# Patient Record
Sex: Female | Born: 1960
Health system: Southern US, Community
[De-identification: ages and names within clinical notes are randomized; demographics above are authoritative.]

## PROBLEM LIST (undated history)

## (undated) DIAGNOSIS — K219 Gastro-esophageal reflux disease without esophagitis: Secondary | ICD-10-CM

## (undated) DIAGNOSIS — Z8669 Personal history of other diseases of the nervous system and sense organs: Secondary | ICD-10-CM

## (undated) DIAGNOSIS — E785 Hyperlipidemia, unspecified: Secondary | ICD-10-CM

## (undated) DIAGNOSIS — I10 Essential (primary) hypertension: Secondary | ICD-10-CM

## (undated) DIAGNOSIS — E119 Type 2 diabetes mellitus without complications: Secondary | ICD-10-CM

## (undated) DIAGNOSIS — F329 Major depressive disorder, single episode, unspecified: Secondary | ICD-10-CM

## (undated) DIAGNOSIS — R519 Headache, unspecified: Secondary | ICD-10-CM

## (undated) DIAGNOSIS — F32A Depression, unspecified: Secondary | ICD-10-CM

## (undated) DIAGNOSIS — F419 Anxiety disorder, unspecified: Secondary | ICD-10-CM

## (undated) HISTORY — DX: Essential (primary) hypertension: I10

## (undated) HISTORY — DX: Hyperlipidemia, unspecified: E78.5

## (undated) HISTORY — PX: COLONOSCOPY W/ POLYPECTOMY: SHX1380

## (undated) HISTORY — DX: Personal history of other diseases of the nervous system and sense organs: Z86.69

---

## 1997-10-12 ENCOUNTER — Other Ambulatory Visit: Admission: RE | Admit: 1997-10-12 | Discharge: 1997-10-12 | Payer: Self-pay | Admitting: Obstetrics and Gynecology

## 1997-10-17 ENCOUNTER — Inpatient Hospital Stay (HOSPITAL_COMMUNITY): Admission: AD | Admit: 1997-10-17 | Discharge: 1997-10-17 | Payer: Self-pay | Admitting: Obstetrics & Gynecology

## 1997-11-07 ENCOUNTER — Inpatient Hospital Stay (HOSPITAL_COMMUNITY): Admission: AD | Admit: 1997-11-07 | Discharge: 1997-11-09 | Payer: Self-pay | Admitting: Obstetrics & Gynecology

## 1997-12-05 ENCOUNTER — Other Ambulatory Visit: Admission: RE | Admit: 1997-12-05 | Discharge: 1997-12-05 | Payer: Self-pay | Admitting: Obstetrics & Gynecology

## 1998-07-29 ENCOUNTER — Emergency Department (HOSPITAL_COMMUNITY): Admission: EM | Admit: 1998-07-29 | Discharge: 1998-07-29 | Payer: Self-pay | Admitting: Emergency Medicine

## 1999-02-12 ENCOUNTER — Other Ambulatory Visit: Admission: RE | Admit: 1999-02-12 | Discharge: 1999-02-12 | Payer: Self-pay | Admitting: Gynecology

## 2000-02-13 ENCOUNTER — Other Ambulatory Visit: Admission: RE | Admit: 2000-02-13 | Discharge: 2000-02-13 | Payer: Self-pay | Admitting: Obstetrics & Gynecology

## 2001-06-08 ENCOUNTER — Other Ambulatory Visit: Admission: RE | Admit: 2001-06-08 | Discharge: 2001-06-08 | Payer: Self-pay | Admitting: Obstetrics & Gynecology

## 2002-07-19 ENCOUNTER — Encounter: Payer: Self-pay | Admitting: Obstetrics

## 2002-07-19 ENCOUNTER — Ambulatory Visit (HOSPITAL_COMMUNITY): Admission: RE | Admit: 2002-07-19 | Discharge: 2002-07-19 | Payer: Self-pay | Admitting: Obstetrics

## 2002-12-16 ENCOUNTER — Emergency Department (HOSPITAL_COMMUNITY): Admission: EM | Admit: 2002-12-16 | Discharge: 2002-12-16 | Payer: Self-pay | Admitting: Emergency Medicine

## 2005-09-16 ENCOUNTER — Ambulatory Visit (HOSPITAL_COMMUNITY): Admission: RE | Admit: 2005-09-16 | Discharge: 2005-09-16 | Payer: Self-pay | Admitting: Obstetrics

## 2007-10-07 ENCOUNTER — Emergency Department (HOSPITAL_COMMUNITY): Admission: EM | Admit: 2007-10-07 | Discharge: 2007-10-07 | Payer: Self-pay | Admitting: Emergency Medicine

## 2009-04-18 IMAGING — CT CT HEAD W/O CM
1 series · 16 of 30 positions shown, 20 images · non-contrast
Comparison: None.

CLINICAL DATA: 46-year-old female with headache nausea and
tingling.

CT HEAD WITHOUT CONTRAST
TECHNIQUE: Contiguous axial images were obtained from the base of
the skull through the vertex without contrast.

[Series 2: head routine 4.8 h37s · axial · 0.39mm/px · z∈[+1392,+1547]mm · 16 of 36 slices shown, 20 images]
[im 2/36  brain]
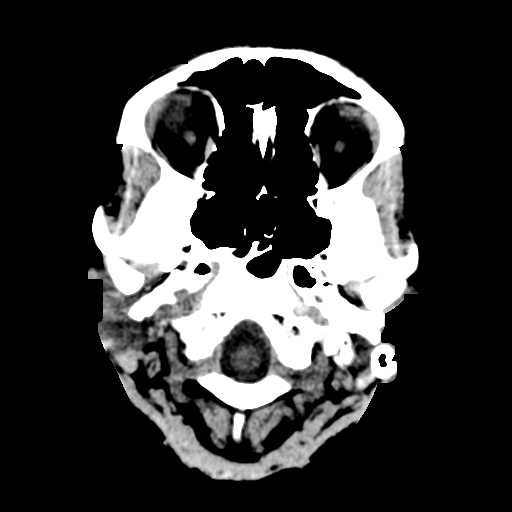
[im 2/36  bone]
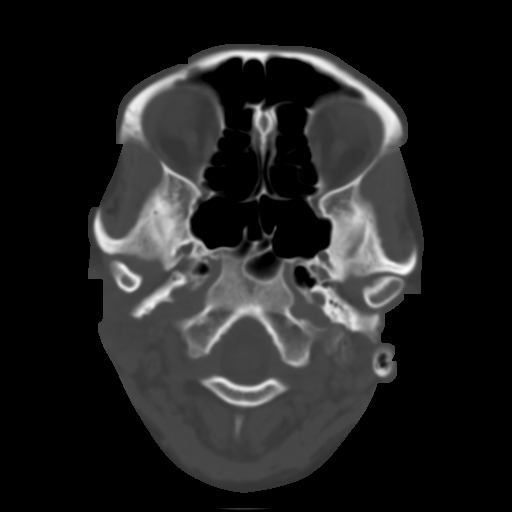
[im 4/36  brain]
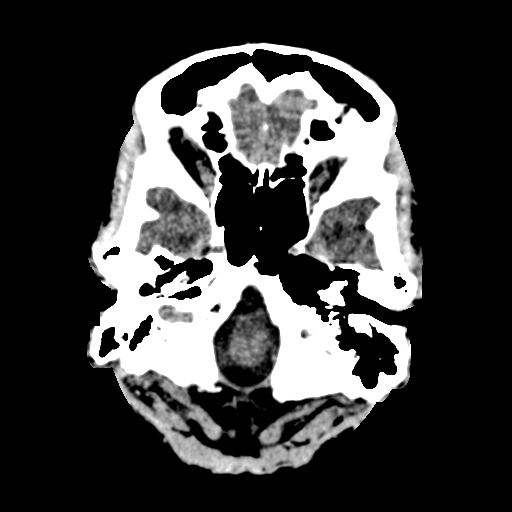
[im 7/36  brain]
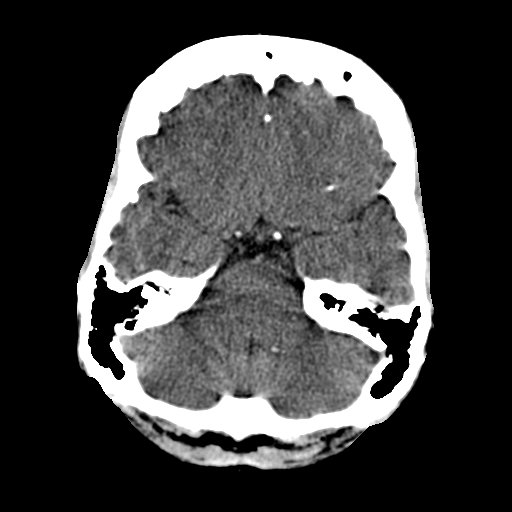
[im 9/36  brain]
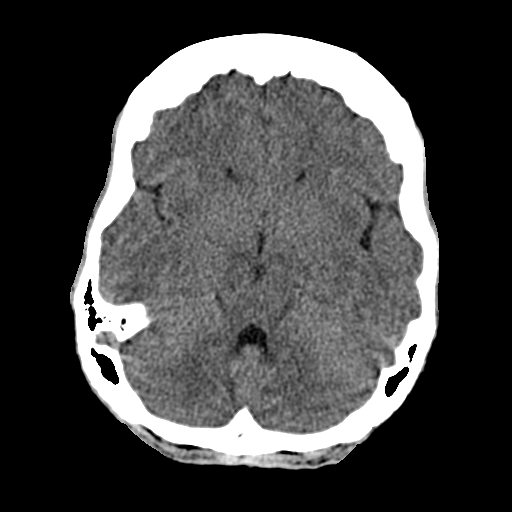
[im 10/36  brain]
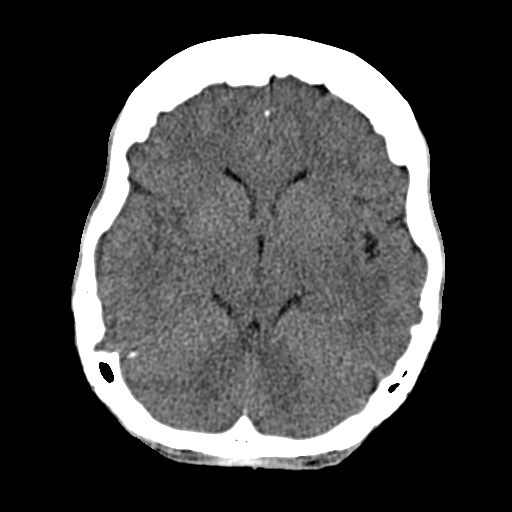
[im 10/36  bone]
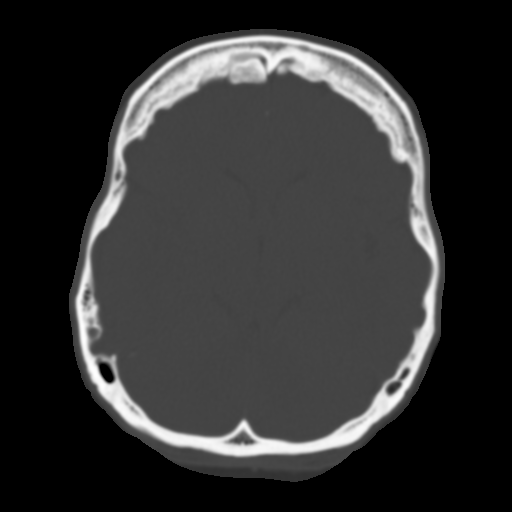
[im 13/36  brain]
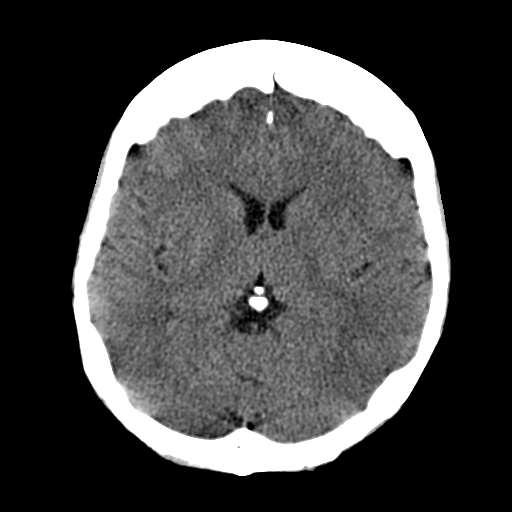
[im 15/36  brain]
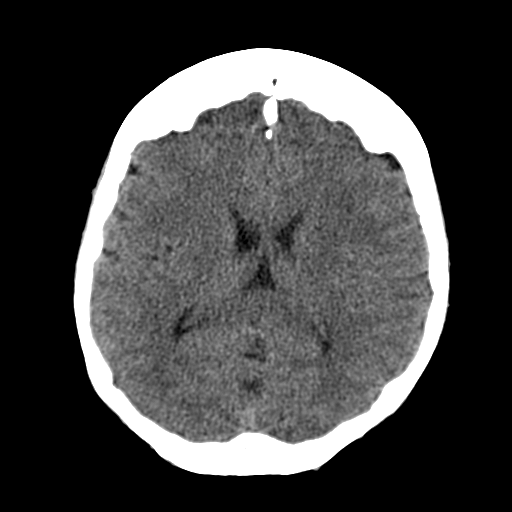
[im 17/36  brain]
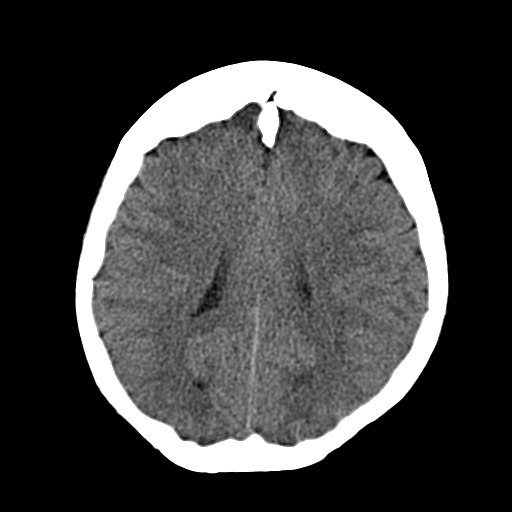
[im 19/36  brain]
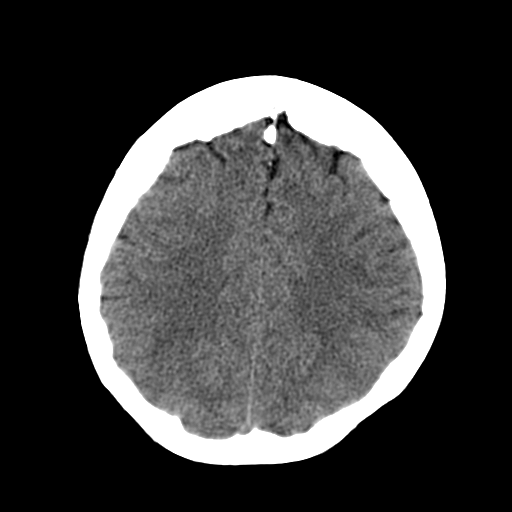
[im 19/36  bone]
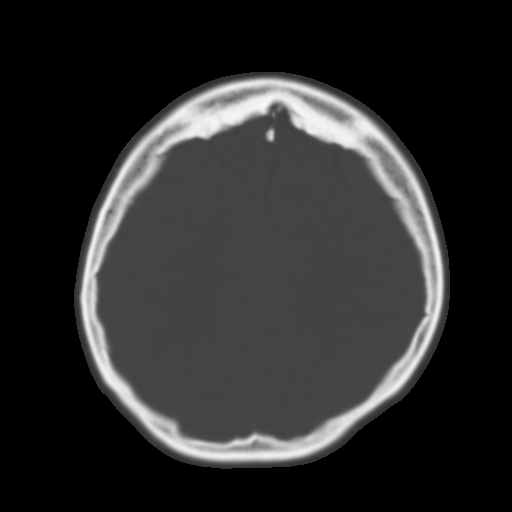
[im 21/36  brain]
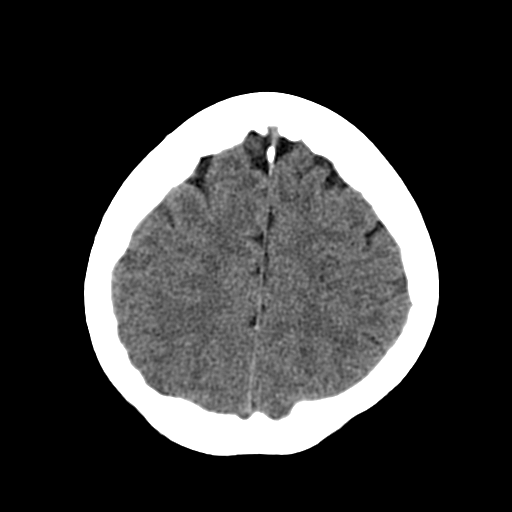
[im 23/36  brain]
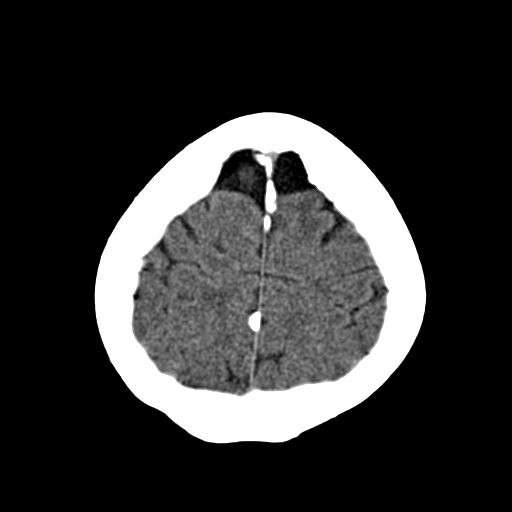
[im 26/36  brain]
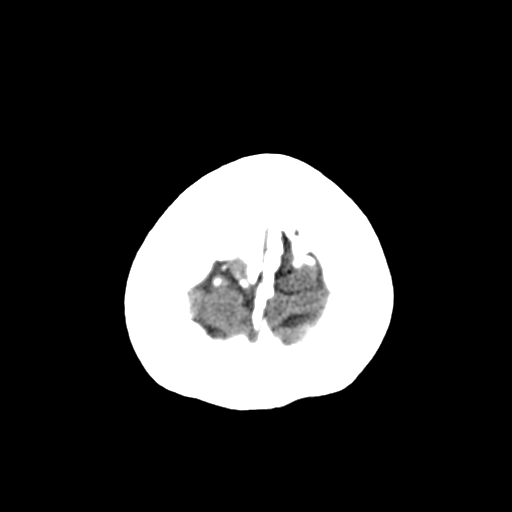
[im 27/36  brain]
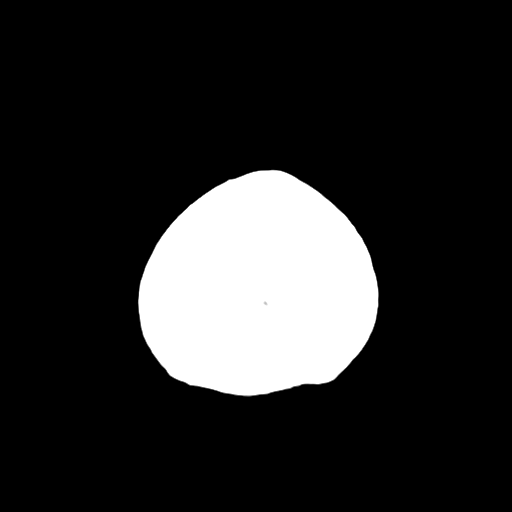
[im 27/36  bone]
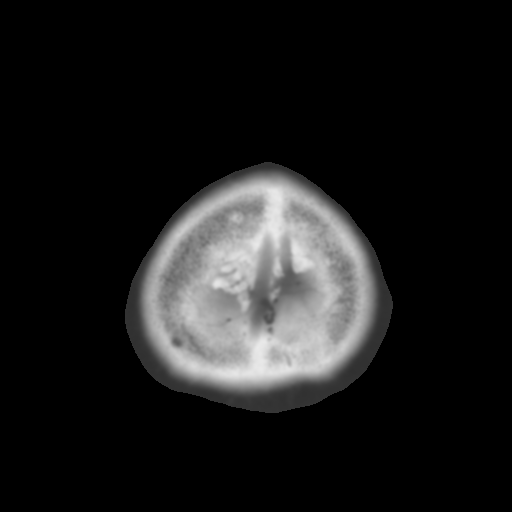
[im 29/36  brain]
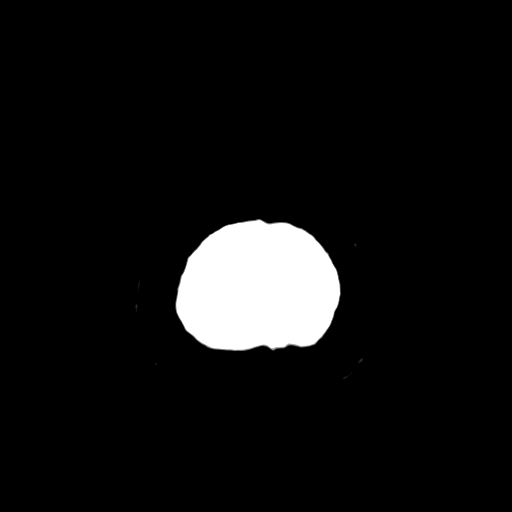
[im 32/36  brain]
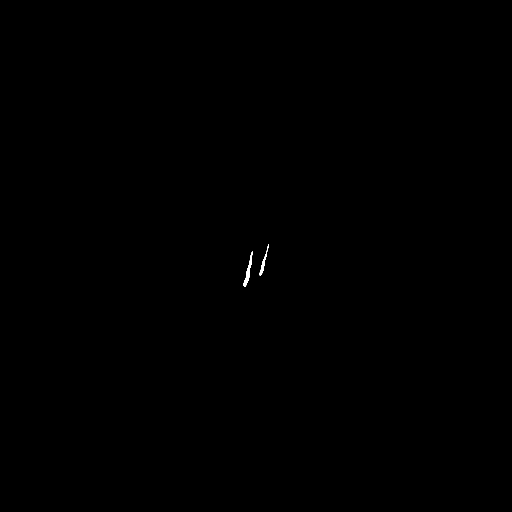
[im 34/36  brain]
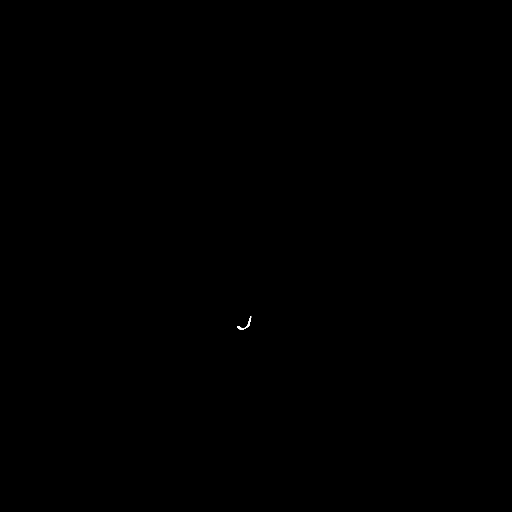

[16 of 30 positions shown; findings below may reference images not displayed]

FINDINGS: Visualized scalp soft tissues and orbits are normal.
Visualized paranasal sinuses and mastoids are clear.  Incidental
hyperostosis frontalis.  No acute osseous abnormality.

Cerebral volume within normal limits for age.  No midline shift,
ventriculomegaly, mass effect, intracranial hemorrhage or loss of
gray-white matter differentiation.  No evidence of acute cortically
based infarct.
IMPRESSION: Normal noncontrast appearance of the brain.  No acute findings.

## 2009-10-26 ENCOUNTER — Encounter: Admission: RE | Admit: 2009-10-26 | Discharge: 2009-10-26 | Payer: Self-pay | Admitting: Neurology

## 2009-11-22 ENCOUNTER — Ambulatory Visit: Payer: Self-pay | Admitting: Vascular Surgery

## 2010-03-14 ENCOUNTER — Ambulatory Visit: Payer: Self-pay | Admitting: Vascular Surgery

## 2010-05-16 ENCOUNTER — Ambulatory Visit: Payer: Self-pay | Admitting: Vascular Surgery

## 2010-05-23 ENCOUNTER — Ambulatory Visit: Payer: Self-pay | Admitting: Vascular Surgery

## 2010-11-13 NOTE — Procedures (Signed)
DUPLEX DEEP VENOUS EXAM - LOWER EXTREMITY   INDICATION:  Follow up left greater saphenous vein ablation.   HISTORY:  Edema:  No.  Trauma/Surgery:  Yes, left greater saphenous vein ablation, 05/16/2010.  Pain:  Yes.  PE:  No.  Previous DVT:  No.  Anticoagulants:  No.  Other:   DUPLEX EXAM:                CFV   SFV   PopV  PTV    GSV                R  L  R  L  R  L  R   L  R  L  Thrombosis    o  o     o     o      o     +  Spontaneous   +  +     +     +      +     o  Phasic        +  +     +     +      +     o  Augmentation  +  +     +     +      +     o  Compressible  +  +     +     +      +     o  Competent     +  +     +     +      +     o   Legend:  + - yes  o - no  p - partial  D - decreased   IMPRESSION:  1. There is no evidence of acute deep vein thrombosis within the left      lower extremity.  2. Successful  left greater saphenous vein ablation from the      saphenofemoral junction to distal insertion site.    _____________________________  Larina Earthly, M.D.   OD/MEDQ  D:  05/23/2010  T:  05/23/2010  Job:  [id number]

## 2010-11-13 NOTE — Assessment & Plan Note (Signed)
OFFICE VISIT   Danielle Calderon, Danielle Calderon  DOB:  [birth year]                                       03/14/2010  ZOXWR#:[id number]   Patient presents today for continued discussion regarding her severe  venous hypertension in her left leg.  She reports that she has had no  improvement with thigh-high graduated compression.  She continues to  elevate her legs when possible and does take ibuprofen for discomfort.  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  She also reports that housework and house-cleaning  chores are difficult due to leg pain as well.  Her medical history is  otherwise unchanged.  She has excellent health except for mild elevated  blood pressure and elevated cholesterol.  She does have a history of  headaches.   SOCIAL HISTORY:  Single with 1 child.  She does not smoke, having quit  in 1994.  She does not drink alcohol.  She works as an Charity fundraiser.   Review of systems is unchanged, as documented in her chart.   PHYSICAL EXAMINATION:  A well-developed and well-nourished black female  appearing stated age in no acute distress.  Blood pressure is 128/80,  pulse 80, respirations 18.  Her temperature is 98 degrees.  HEENT is  normal.  Cardiovascular:  She has 2+ radial and 2+ dorsalis pedis pulses  bilaterally.  Musculoskeletal shows no major deformities or cyanosis.  Neurologic:  No focal weakness or paresthesias.  Skin without ulcers or  rashes.  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   I have reviewed her duplex with her and reimaged her vein.  She does  have reflux in her saphenous vein.  This is a normal course throughout  the proximal thigh and then becomes more superficial after branching all  the way down to the takeoff into the varicosities.   I have recommended that we proceed with laser ablation for  relief of her  symptoms and stab phlebectomy for removal of the tributaries  varicosities.  She understands this is an outpatient procedure under  local anesthesia.  We will schedule this at her earliest convenience.     Larina Earthly, M.D.  Electronically Signed   TFE/MEDQ  D:  03/14/2010  T:  03/14/2010  Job:  [id number]   cc:   Candyce Churn. Allyne Gee, M.D.

## 2010-11-13 NOTE — Consult Note (Signed)
NEW PATIENT CONSULTATION   Danielle Calderon, Danielle Calderon  DOB:  [birth year]                                       11/22/2009  [id number]#:[id number]   The patient presents today for evaluation of venous hypertension,  varicose veins in her left leg.  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  She describes this as an achy sensation that occurs at the end of  the day and also present at night after standing on her legs for a long  period of time.  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  She does not have any history of DVT or  bleeding.  She reports that this area in her pretibial area and calf can  be relieved somewhat with elevation.   Her past medical history is significant for hypertension, elevated  cholesterol.   Her family history is negative for premature atherosclerotic disease and  is negative for venous pathology.   SOCIAL HISTORY:  She is single.  She has one child.  She does not smoke,  having quit in 1994.  She does not drink alcohol.   REVIEW OF SYSTEMS:  Positive for weight loss down to 161 pounds.  She is  5 feet 5 inches tall.  Cardiac, pulmonary, GI and GU are negative.  VASCULAR:  Positive only for the above.  NEUROLOGIC:  Positive for headaches.  MUSCULOSKELETAL:  Negative.  PSYCHIATRIC:  Positive for depression and anxiety.  HEENT:  Positive only for change of eyesight.  Hematologic and skin is negative.   PHYSICAL EXAMINATION:  General:  A well-developed, well-nourished female  appearing stated age in no acute distress.  Vital signs:  Blood pressure  is 120/60, her heart rate is 72, respirations 18.  HEENT:  Normal aside  from poor dentition.  Chest:  Her chest is clear.  Abdomen:  Soft,  nontender.  Musculoskeletal:  Shows no major deformities or cyanosis.  Neurological:  No focal weakness or paresthesias.  Skin:  Without rashes  or  ulcers or changes of venous hypertension.  She does have palpable  varicosities over her left pretibial area.   On duplex ultrasound these do arise from her great saphenous vein.  She  underwent formal duplex and this shows no evidence of reflux in her  small saphenous vein on the left.  She does have mild reflux in her deep  system.  Does have gross reflux throughout her saphenous vein on her  left leg from her saphenofemoral junction distally.  The vein is not  aneurysmal or tortuous.  I discussed the significance of this with the  patient.  I explained that this is related to her hypertension extending  into these varicosities.  She has not worn compression garments and I  explained the importance of these.  She is fitted today with 20-30 mmHg  thigh high compression garments and instructed on their daily use while  she is standing.  She will continue with ibuprofen for discomfort and  elevate her legs when possible.  We will see her again in 3 months to  determine if conservative treatment is effective.  I also discussed the  possibility of laser ablation of her saphenous vein and stab phlebectomy  with multiple tributary varicosities should  she fail conservative  treatment.  She understands and will see [place] again in 3 months.     Larina Earthly, M.D.  Electronically Signed   TFE/MEDQ  D:  11/22/2009  T:  11/23/2009  Job:  [id number]   cc:   Candyce Churn. Allyne Gee, M.D.

## 2010-11-13 NOTE — Assessment & Plan Note (Signed)
OFFICE VISIT   Danielle Calderon, Danielle Calderon  DOB:  September 15, 1960                                       05/23/2010  ZOXWR#:[id number]   Patient presents today for follow-up of her left great saphenous vein  laser ablation and stab phlebectomy.  She had the usual amount of  postoperative soreness and bruising and is doing quite well.   She underwent a repeat duplex in our office.  This reveals no evidence  of DVT and complete closure from her distal insertion site to just below  the saphenofemoral junction.  We are quite pleased with her results.  Plan to see her again on an as-needed basis.     Larina Earthly, M.D.  Electronically Signed   TFE/MEDQ  D:  05/23/2010  T:  05/23/2010  Job:  [id number]

## 2010-11-13 NOTE — Procedures (Signed)
LOWER EXTREMITY VENOUS REFLUX EXAM   INDICATION:  Painful varicose veins.   EXAM:  Using color-flow imaging and pulse Doppler spectral analysis, the  left common femoral, superficial femoral, popliteal, posterior tibial,  greater and lesser saphenous veins are evaluated.  There is evidence  suggesting deep venous insufficiency in the left lower extremity.   The left saphenofemoral junction is not competent with reflux of  >517milliseconds. The left GSV is not competent with reflux of  >550milliseconds with the caliber as described below.   The left proximal short saphenous vein demonstrates competency.   GSV Diameter (used if found to be incompetent only)                                            Right    Left  Proximal Greater Saphenous Vein           cm       0.85 cm  Proximal-to-mid-thigh                     cm       0.66 cm  Mid thigh                                 cm       0.58 cm  Mid-distal thigh                          cm       0.59 cm  Distal thigh                              cm       0.59 cm  Knee                                      cm       0.64 cm    IMPRESSION:  1. Left greater saphenous vein reflux with >572milliseconds is      identified with the caliber ranging from 0.58 cm to 0.85 cm knee to      groin.  2. The left greater saphenous vein is not aneurysmal.  3. The left greater saphenous vein is not tortuous.  4. The deep venous system is not competent with reflux of      >569milliseconds.  5. The left lesser saphenous vein is competent.         ___________________________________________  Larina Earthly, M.D.   CB/MEDQ  D:  11/22/2009  T:  11/22/2009  Job:  [id number]

## 2010-11-13 NOTE — Assessment & Plan Note (Signed)
OFFICE VISIT   ZEINA, AKKERMAN  DOB:  Dec 30, 1960                                       05/16/2010  UEAVW#:[id number]   The patient underwent uneventful laser ablation of her left great  saphenous vein from below-knee position up to just below her  saphenofemoral junction and stab phlebectomy of 10 to 20 tributary  varicosities in her medial calf.  She had no immediate complications and  will be seen again in 1 week for followup.     Larina Earthly, M.D.  Electronically Signed   TFE/MEDQ  D:  05/16/2010  T:  05/17/2010  Job:  [id number]

## 2011-03-26 LAB — POCT I-STAT, CHEM 8
Creatinine, Ser: 1
HCT: 37
Hemoglobin: 12.6
Potassium: 3.2 — ABNORMAL LOW
Sodium: 140
TCO2: 29

## 2011-03-26 LAB — URINALYSIS, ROUTINE W REFLEX MICROSCOPIC
Bilirubin Urine: NEGATIVE
Ketones, ur: 15 — AB
Nitrite: NEGATIVE
Specific Gravity, Urine: 1.012
Urobilinogen, UA: 1
pH: 7

## 2011-03-26 LAB — POCT CARDIAC MARKERS
CKMB, poc: <1 — ABNORMAL LOW
Myoglobin, poc: 80.2
Operator id: 257131
Troponin i, poc: <0.05

## 2011-03-26 LAB — DIFFERENTIAL
Lymphocytes Relative: 45
Lymphs Abs: 2.5
Monocytes Relative: 8
Neutro Abs: 2.5
Neutrophils Relative %: 45

## 2011-03-26 LAB — CBC
MCV: 79.9
Platelets: 228
RBC: 4.34
WBC: 5.6

## 2011-03-26 LAB — URINE MICROSCOPIC-ADD ON

## 2011-03-26 LAB — POCT PREGNANCY, URINE
Operator id: 257131
Preg Test, Ur: NEGATIVE

## 2011-03-26 LAB — URINE CULTURE: Colony Count: 10000

## 2012-10-26 ENCOUNTER — Encounter: Payer: Self-pay | Admitting: Obstetrics

## 2012-10-26 ENCOUNTER — Ambulatory Visit (INDEPENDENT_AMBULATORY_CARE_PROVIDER_SITE_OTHER): Payer: BC Managed Care – PPO | Admitting: Obstetrics

## 2012-10-26 VITALS — BP 123/80 | HR 62 | Temp 97.9°F | Ht 65.0 in | Wt 184.0 lb

## 2012-10-26 DIAGNOSIS — Z124 Encounter for screening for malignant neoplasm of cervix: Secondary | ICD-10-CM

## 2012-10-26 DIAGNOSIS — Z01419 Encounter for gynecological examination (general) (routine) without abnormal findings: Secondary | ICD-10-CM

## 2012-10-26 DIAGNOSIS — Z113 Encounter for screening for infections with a predominantly sexual mode of transmission: Secondary | ICD-10-CM

## 2012-10-26 DIAGNOSIS — N760 Acute vaginitis: Secondary | ICD-10-CM

## 2012-10-26 NOTE — Progress Notes (Signed)
Subjective:     Danielle Calderon is a 52 y.o. female here for a routine exam.  Current complaints: annual exam. Pt would like to follow up on her fibroids. Pt states she is not having any issues from them.  Personal health questionnaire reviewed: yes.   Gynecologic History No LMP recorded. Contraception: abstinence Last Pap: 2011 Results were: normal Last mammogram: 2014. Results were: normal  Obstetric History OB History   Grav Para Term Preterm Abortions TAB SAB Ect Mult Living                   The following portions of the patient's history were reviewed and updated as appropriate: allergies, current medications, past family history, past medical history, past social history, past surgical history and problem list.  Review of Systems Pertinent items are noted in HPI.     Objective:    General appearance: alert and no distress Breasts: normal appearance, no masses or tenderness Abdomen: normal findings: soft, non-tender Pelvic: cervix normal in appearance, external genitalia normal, no adnexal masses or tenderness, no cervical motion tenderness, uterus normal size, shape, and consistency and vagina normal without discharge    Assessment:    Healthy female exam.    Plan:    Education reviewed: calcium supplements and self breast exams. F/U 1 year.

## 2012-10-27 ENCOUNTER — Encounter: Payer: Self-pay | Admitting: Obstetrics

## 2012-10-27 LAB — PAP IG W/ RFLX HPV ASCU

## 2012-10-27 LAB — WET PREP BY MOLECULAR PROBE
Candida species: NEGATIVE
Gardnerella vaginalis: POSITIVE — AB
Trichomonas vaginosis: NEGATIVE

## 2012-10-27 NOTE — Patient Instructions (Signed)
Wellness.  Menopause Healthy eating and living.

## 2012-10-30 ENCOUNTER — Other Ambulatory Visit: Payer: Self-pay | Admitting: *Deleted

## 2012-10-30 DIAGNOSIS — B9689 Other specified bacterial agents as the cause of diseases classified elsewhere: Secondary | ICD-10-CM

## 2012-10-30 MED ORDER — METRONIDAZOLE 500 MG PO TABS: 500.0000 mg | ORAL_TABLET | Freq: Two times a day (BID) | ORAL | Status: AC

## 2012-10-30 NOTE — Progress Notes (Signed)
Affirm test positive for BV per Dr. Verdell Carmine recs pt aware and medication escribed to pts pharmacy.

## 2013-03-02 ENCOUNTER — Other Ambulatory Visit: Payer: Self-pay | Admitting: Dermatology

## 2013-12-15 ENCOUNTER — Ambulatory Visit: Payer: BC Managed Care – PPO | Admitting: Obstetrics

## 2014-03-29 ENCOUNTER — Ambulatory Visit: Payer: No Typology Code available for payment source | Admitting: Obstetrics

## 2014-04-06 ENCOUNTER — Ambulatory Visit (INDEPENDENT_AMBULATORY_CARE_PROVIDER_SITE_OTHER): Payer: No Typology Code available for payment source | Admitting: Obstetrics

## 2014-04-06 ENCOUNTER — Encounter: Payer: Self-pay | Admitting: Obstetrics

## 2014-04-06 VITALS — BP 108/69 | HR 64 | Temp 97.1°F | Ht 65.0 in | Wt 203.0 lb

## 2014-04-06 DIAGNOSIS — Z0000 Encounter for general adult medical examination without abnormal findings: Secondary | ICD-10-CM

## 2014-04-06 DIAGNOSIS — Z124 Encounter for screening for malignant neoplasm of cervix: Secondary | ICD-10-CM

## 2014-04-06 DIAGNOSIS — Z01419 Encounter for gynecological examination (general) (routine) without abnormal findings: Secondary | ICD-10-CM

## 2014-04-06 NOTE — Progress Notes (Signed)
Subjective:     Danielle Calderon is a 53 y.o. female here for a routine exam.  Current complaints: none.    Personal health questionnaire:  Is patient Ashkenazi Jewish, have a family history of breast and/or ovarian cancer: no Is there a family history of uterine cancer diagnosed at age < 24, gastrointestinal cancer, urinary tract cancer, family member who is a Field seismologist syndrome-associated carrier: no Is the patient overweight and hypertensive, family history of diabetes, personal history of gestational diabetes or PCOS: no Is patient over 29, have PCOS,  family history of premature CHD under age 26, diabetes, smoke, have hypertension or peripheral artery disease:  no At any time, has a partner hit, kicked or otherwise hurt or frightened you?: no Over the past 2 weeks, have you felt down, depressed or hopeless?: no Over the past 2 weeks, have you felt little interest or pleasure in doing things?:no   Gynecologic History No LMP recorded. Patient is postmenopausal. Contraception: abstinence and post menopausal status Last Pap: 2014. Results were: normal Last mammogram: 2015. Results were: normal  Obstetric History OB History  Gravida Para Term Preterm AB SAB TAB Ectopic Multiple Living  3 1 1  2     1     # Outcome Date GA Lbr Len/2nd Weight Sex Delivery Anes PTL Lv  3 TRM     F SVD   Y  2 ABT           1 ABT               Past Medical History  Diagnosis Date  . Hypertension   . History of migraine headaches   . Hyperlipidemia     History reviewed. No pertinent past surgical history.  Current outpatient prescriptions:alendronate (FOSAMAX) 70 MG tablet, Take 70 mg by mouth every 7 (seven) days. Take with a full glass of water on an empty stomach., Disp: , Rfl: ;  calcium carbonate 1250 MG capsule, Take 1,250 mg by mouth daily., Disp: , Rfl: ;  cholecalciferol (VITAMIN D) 1000 UNITS tablet, Take 3,000 Units by mouth daily., Disp: , Rfl: ;  citalopram (CELEXA) 40 MG tablet, Take 40 mg by  mouth daily., Disp: , Rfl:  gabapentin (NEURONTIN) 300 MG capsule, Take 300 mg by mouth 3 (three) times daily., Disp: , Rfl: ;  Multiple Vitamins-Minerals (MULTIVITAMIN WITH MINERALS) tablet, Take 1 tablet by mouth daily., Disp: , Rfl: ;  rosuvastatin (CRESTOR) 5 MG tablet, Take 5 mg by mouth daily., Disp: , Rfl: ;  telmisartan (MICARDIS) 40 MG tablet, Take 40 mg by mouth daily., Disp: , Rfl:  Allergies  Allergen Reactions  . Lisinopril Cough    History  Substance Use Topics  . Smoking status: Former Research scientist (life sciences)  . Smokeless tobacco: Former Systems developer    Quit date: 10/26/1992  . Alcohol Use: Yes     Comment: rare occasion     Family History  Problem Relation Age of Onset  . Hypertension Mother   . Hypertension Father   . Cancer Father   . Diabetes Father       Review of Systems  Constitutional: negative for fatigue and weight loss Respiratory: negative for cough and wheezing Cardiovascular: negative for chest pain, fatigue and palpitations Gastrointestinal: negative for abdominal pain and change in bowel habits Musculoskeletal:negative for myalgias Neurological: negative for gait problems and tremors Behavioral/Psych: negative for abusive relationship, depression Endocrine: negative for temperature intolerance   Genitourinary:negative for abnormal menstrual periods, genital lesions, hot flashes, sexual problems and vaginal  discharge Integument/breast: negative for breast lump, breast tenderness, nipple discharge and skin lesion(s)    Objective:       BP 108/69  Pulse 64  Temp(Src) 97.1 F (36.2 C)  Ht 5\' 5"  (1.651 m)  Wt 203 lb (92.08 kg)  BMI 33.78 kg/m2 General:   alert  Skin:   no rash or abnormalities  Lungs:   clear to auscultation bilaterally  Heart:   regular rate and rhythm, S1, S2 normal, no murmur, click, rub or gallop  Breasts:   normal without suspicious masses, skin or nipple changes or axillary nodes  Abdomen:  normal findings: no organomegaly, soft, non-tender  and no hernia  Pelvis:  External genitalia: normal general appearance Urinary system: urethral meatus normal and bladder without fullness, nontender Vaginal: normal without tenderness, induration or masses Cervix: normal appearance Adnexa: normal bimanual exam Uterus: anteverted and non-tender, normal size   Lab Review Urine pregnancy test Labs reviewed yes Radiologic studies reviewed yes    Assessment:    Healthy female exam.    Plan:    Education reviewed: calcium supplements, low fat, low cholesterol diet, self breast exams and weight bearing exercise. Follow up in: 1 year.   Meds ordered this encounter  Medications  . cholecalciferol (VITAMIN D) 1000 UNITS tablet    Sig: Take 3,000 Units by mouth daily.  . calcium carbonate 1250 MG capsule    Sig: Take 1,250 mg by mouth daily.  . Multiple Vitamins-Minerals (MULTIVITAMIN WITH MINERALS) tablet    Sig: Take 1 tablet by mouth daily.   No orders of the defined types were placed in this encounter.

## 2014-04-06 NOTE — Addendum Note (Signed)
Addended by: Ladona Ridgel on: 04/06/2014 04:40 PM   Modules accepted: Orders

## 2014-04-07 ENCOUNTER — Other Ambulatory Visit: Payer: Self-pay | Admitting: Obstetrics

## 2014-04-07 DIAGNOSIS — N760 Acute vaginitis: Principal | ICD-10-CM

## 2014-04-07 DIAGNOSIS — B9689 Other specified bacterial agents as the cause of diseases classified elsewhere: Secondary | ICD-10-CM

## 2014-04-07 LAB — WET PREP BY MOLECULAR PROBE
CANDIDA SPECIES: NEGATIVE
Gardnerella vaginalis: POSITIVE — AB
TRICHOMONAS VAG: NEGATIVE

## 2014-04-07 MED ORDER — METRONIDAZOLE 500 MG PO TABS: 500.0000 mg | ORAL_TABLET | Freq: Two times a day (BID) | ORAL | Status: DC

## 2014-04-08 LAB — PAP IG AND HPV HIGH-RISK: HPV DNA High Risk: NOT DETECTED

## 2014-05-02 ENCOUNTER — Encounter: Payer: Self-pay | Admitting: Obstetrics

## 2015-04-10 ENCOUNTER — Encounter: Payer: Self-pay | Admitting: Obstetrics

## 2015-04-10 ENCOUNTER — Ambulatory Visit (INDEPENDENT_AMBULATORY_CARE_PROVIDER_SITE_OTHER): Payer: No Typology Code available for payment source | Admitting: Obstetrics

## 2015-04-10 VITALS — BP 121/78 | HR 69 | Temp 98.2°F | Ht 65.0 in | Wt 202.0 lb

## 2015-04-10 DIAGNOSIS — Z1389 Encounter for screening for other disorder: Secondary | ICD-10-CM | POA: Diagnosis not present

## 2015-04-10 DIAGNOSIS — Z01419 Encounter for gynecological examination (general) (routine) without abnormal findings: Secondary | ICD-10-CM | POA: Diagnosis not present

## 2015-04-10 DIAGNOSIS — Z780 Asymptomatic menopausal state: Secondary | ICD-10-CM

## 2015-04-10 LAB — POCT URINALYSIS DIPSTICK
BILIRUBIN UA: NEGATIVE
GLUCOSE UA: NEGATIVE
Ketones, UA: NEGATIVE
Leukocytes, UA: NEGATIVE
Nitrite, UA: NEGATIVE
Protein, UA: NEGATIVE
RBC UA: NEGATIVE
SPEC GRAV UA: 1.01
UROBILINOGEN UA: NEGATIVE
pH, UA: 7

## 2015-04-10 NOTE — Progress Notes (Signed)
Subjective:        Danielle Calderon is a 54 y.o. female here for a routine exam.  Current complaints: Urinary frequency.    Personal health questionnaire:  Is patient Ashkenazi Jewish, have a family history of breast and/or ovarian cancer: no Is there a family history of uterine cancer diagnosed at age < 37, gastrointestinal cancer, urinary tract cancer, family member who is a Field seismologist syndrome-associated carrier: no Is the patient overweight and hypertensive, family history of diabetes, personal history of gestational diabetes, preeclampsia or PCOS: no Is patient over 30, have PCOS,  family history of premature CHD under age 13, diabetes, smoke, have hypertension or peripheral artery disease:  no At any time, has a partner hit, kicked or otherwise hurt or frightened you?: no Over the past 2 weeks, have you felt down, depressed or hopeless?: no Over the past 2 weeks, have you felt little interest or pleasure in doing things?:no   Gynecologic History No LMP recorded. Patient is postmenopausal. Contraception: post menopausal status Last Pap: 2015. Results were: normal Last mammogram: 2016. Results were: normal  Obstetric History OB History  Gravida Para Term Preterm AB SAB TAB Ectopic Multiple Living  3 1 1  2     1     # Outcome Date GA Lbr Len/2nd Weight Sex Delivery Anes PTL Lv  3 Term     F Vag-Spont   Y  2 AB           1 AB               Past Medical History  Diagnosis Date  . Hypertension   . History of migraine headaches   . Hyperlipidemia     History reviewed. No pertinent past surgical history.   Current outpatient prescriptions:  .  alendronate (FOSAMAX) 70 MG tablet, Take 70 mg by mouth every 7 (seven) days. Take with a full glass of water on an empty stomach., Disp: , Rfl:  .  calcium carbonate 1250 MG capsule, Take 1,250 mg by mouth daily., Disp: , Rfl:  .  cholecalciferol (VITAMIN D) 1000 UNITS tablet, Take 3,000 Units by mouth daily., Disp: , Rfl:  .  citalopram  (CELEXA) 40 MG tablet, Take 40 mg by mouth daily., Disp: , Rfl:  .  gabapentin (NEURONTIN) 300 MG capsule, Take 300 mg by mouth 3 (three) times daily., Disp: , Rfl:  .  Multiple Vitamins-Minerals (MULTIVITAMIN WITH MINERALS) tablet, Take 1 tablet by mouth daily., Disp: , Rfl:  .  rosuvastatin (CRESTOR) 5 MG tablet, Take 5 mg by mouth daily., Disp: , Rfl:  .  telmisartan (MICARDIS) 40 MG tablet, Take 40 mg by mouth daily., Disp: , Rfl:  Allergies  Allergen Reactions  . Lisinopril Cough    Social History  Substance Use Topics  . Smoking status: Former Research scientist (life sciences)  . Smokeless tobacco: Former Systems developer    Quit date: 10/26/1992  . Alcohol Use: 0.0 oz/week    0 Standard drinks or equivalent per week     Comment: rare occasion     Family History  Problem Relation Age of Onset  . Hypertension Mother   . Hypertension Father   . Cancer Father   . Diabetes Father       Review of Systems  Constitutional: negative for fatigue and weight loss Respiratory: negative for cough and wheezing Cardiovascular: negative for chest pain, fatigue and palpitations Gastrointestinal: negative for abdominal pain and change in bowel habits Musculoskeletal:negative for myalgias Neurological: negative for gait  problems and tremors Behavioral/Psych: negative for abusive relationship, depression Endocrine: negative for temperature intolerance   Genitourinary: positive for urinary frequency Integument/breast: negative for breast lump, breast tenderness, nipple discharge and skin lesion(s)    Objective:       BP 121/78 mmHg  Pulse 69  Temp(Src) 98.2 F (36.8 C)  Ht 5\' 5"  (1.651 m)  Wt 202 lb (91.627 kg)  BMI 33.61 kg/m2 General:   alert  Skin:   no rash or abnormalities  Lungs:   clear to auscultation bilaterally  Heart:   regular rate and rhythm, S1, S2 normal, no murmur, click, rub or gallop  Breasts:   normal without suspicious masses, skin or nipple changes or axillary nodes  Abdomen:  normal findings:  no organomegaly, soft, non-tender and no hernia  Pelvis:  External genitalia: normal general appearance Urinary system: urethral meatus normal and bladder without fullness, nontender Vaginal: normal without tenderness, induration or masses Cervix: normal appearance Adnexa: normal bimanual exam Uterus: anteverted and non-tender, normal size   Lab Review Urine pregnancy test Labs reviewed yes Radiologic studies reviewed yes    Assessment:    Healthy female exam.    Urinary frequency   Plan:   Urine culture sent   Education reviewed: calcium supplements, low fat, low cholesterol diet, self breast exams and weight bearing exercise. Follow up in: 2 years.   No orders of the defined types were placed in this encounter.   No orders of the defined types were placed in this encounter.

## 2015-04-10 NOTE — Addendum Note (Signed)
Addended by: Lewie Loron D on: 04/10/2015 04:41 PM   Modules accepted: Orders

## 2015-04-11 LAB — URINE CULTURE
COLONY COUNT: NO GROWTH
Organism ID, Bacteria: NO GROWTH

## 2015-04-12 LAB — PAP, TP IMAGING W/ HPV RNA, RFLX HPV TYPE 16,18/45: HPV MRNA, HIGH RISK: NOT DETECTED

## 2015-04-14 ENCOUNTER — Other Ambulatory Visit: Payer: Self-pay | Admitting: Obstetrics

## 2015-04-14 DIAGNOSIS — N760 Acute vaginitis: Principal | ICD-10-CM

## 2015-04-14 DIAGNOSIS — B9689 Other specified bacterial agents as the cause of diseases classified elsewhere: Secondary | ICD-10-CM

## 2015-04-14 LAB — SURESWAB BACTERIAL VAGINOSIS/ITIS
Atopobium vaginae: NOT DETECTED Log (cells/mL)
C. GLABRATA, DNA: NOT DETECTED
C. PARAPSILOSIS, DNA: NOT DETECTED
C. TROPICALIS, DNA: NOT DETECTED
C. albicans, DNA: NOT DETECTED
GARDNERELLA VAGINALIS: 7.9 Log (cells/mL)
LACTOBACILLUS SPECIES: NOT DETECTED Log (cells/mL)
MEGASPHAERA SPECIES: NOT DETECTED Log (cells/mL)
T. vaginalis RNA, QL TMA: NOT DETECTED

## 2015-04-14 MED ORDER — METRONIDAZOLE 500 MG PO TABS: 500.0000 mg | ORAL_TABLET | Freq: Two times a day (BID) | ORAL | Status: DC

## 2016-01-04 ENCOUNTER — Ambulatory Visit (HOSPITAL_COMMUNITY)
Admission: EM | Admit: 2016-01-04 | Discharge: 2016-01-04 | Disposition: A | Discharge status: Home / Self Care | Payer: Managed Care, Other (non HMO) | Attending: Emergency Medicine | Admitting: Emergency Medicine

## 2016-01-04 ENCOUNTER — Encounter (HOSPITAL_COMMUNITY): Payer: Self-pay | Admitting: *Deleted

## 2016-01-04 DIAGNOSIS — E785 Hyperlipidemia, unspecified: Secondary | ICD-10-CM | POA: Insufficient documentation

## 2016-01-04 DIAGNOSIS — F329 Major depressive disorder, single episode, unspecified: Secondary | ICD-10-CM | POA: Insufficient documentation

## 2016-01-04 DIAGNOSIS — Z79899 Other long term (current) drug therapy: Secondary | ICD-10-CM | POA: Diagnosis not present

## 2016-01-04 DIAGNOSIS — N390 Urinary tract infection, site not specified: Secondary | ICD-10-CM | POA: Diagnosis not present

## 2016-01-04 DIAGNOSIS — Z87891 Personal history of nicotine dependence: Secondary | ICD-10-CM | POA: Diagnosis not present

## 2016-01-04 DIAGNOSIS — I10 Essential (primary) hypertension: Secondary | ICD-10-CM | POA: Diagnosis not present

## 2016-01-04 HISTORY — DX: Depression, unspecified: F32.A

## 2016-01-04 HISTORY — DX: Major depressive disorder, single episode, unspecified: F32.9

## 2016-01-04 LAB — POCT URINALYSIS DIP (DEVICE)
Bilirubin Urine: NEGATIVE
GLUCOSE, UA: NEGATIVE mg/dL
Hgb urine dipstick: NEGATIVE
Ketones, ur: NEGATIVE mg/dL
Nitrite: NEGATIVE
PH: 7 (ref 5.0–8.0)
PROTEIN: NEGATIVE mg/dL
Specific Gravity, Urine: 1.015 (ref 1.005–1.030)
UROBILINOGEN UA: 0.2 mg/dL (ref 0.0–1.0)

## 2016-01-04 MED ORDER — CEPHALEXIN 500 MG PO CAPS: 500.0000 mg | ORAL_CAPSULE | Freq: Four times a day (QID) | ORAL | Status: DC

## 2016-01-04 NOTE — ED Notes (Signed)
Patient reports foul smelling urine x 2 weeks with nausea and fatigue. Denies any abdominal pain. Denies vaginal discharge or dysuria.

## 2016-01-04 NOTE — Discharge Instructions (Signed)
Urinary Tract Infection A urinary tract infection (UTI) can occur any place along the urinary tract. The tract includes the kidneys, ureters, bladder, and urethra. A type of germ called bacteria often causes a UTI. UTIs are often helped with antibiotic medicine.  HOME CARE   If given, take antibiotics as told by your doctor. Finish them even if you start to feel better.  Drink enough fluids to keep your pee (urine) clear or pale yellow.  Avoid tea, drinks with caffeine, and bubbly (carbonated) drinks.  Pee often. Avoid holding your pee in for a long time.  Pee before and after having sex (intercourse).  Wipe from front to back after you poop (bowel movement) if you are a woman. Use each tissue only once. GET HELP RIGHT AWAY IF:   You have back pain.  You have lower belly (abdominal) pain.  You have chills.  You feel sick to your stomach (nauseous).  You throw up (vomit).  Your burning or discomfort with peeing does not go away.  You have a fever.  Your symptoms are not better in 3 days. MAKE SURE YOU:   Understand these instructions.  Will watch your condition.  Will get help right away if you are not doing well or get worse.   This information is not intended to replace advice given to you by your health care provider. Make sure you discuss any questions you have with your health care provider.   Document Released: 12/04/2007 Document Revised: 07/08/2014 Document Reviewed: 01/16/2012 Elsevier Interactive Patient Education 2016 [place] Urinary Tract Infection FAQs What is "catheter-associated urinary tract infection"? A urinary tract infection (also called "UTI") is an infection in the urinary system, which includes the bladder (which stores the urine) and the kidneys (which filter the blood to make urine). Germs (for example, bacteria or yeasts) do not normally live in these areas; but if germs are introduced, an infection can occur. If you  have a urinary catheter, germs can travel along the catheter and cause an infection in your bladder or your kidney; in that case it is called a catheter-associated urinary tract infection (or "CA-UTI").  What is a urinary catheter? A urinary catheter is a thin tube placed in the bladder to drain urine. Urine drains through the tube into a bag that collects the urine. A urinary catheter may be used:  If you are not able to urinate on your own  To measure the amount of urine that you make, for example, during intensive care  During and after some types of surgery  During some tests of the kidneys and bladder People with urinary catheters have a much higher chance of getting a urinary tract infection than people who don't have a catheter. How do I get a catheter-associated urinary tract infection (CA-UTI)? If germs enter the urinary tract, they may cause an infection. Many of the germs that cause a catheter-associated urinary tract infection are common germs found in your intestines that do not usually cause an infection there. Germs can enter the urinary tract when the catheter is being put in or while the catheter remains in the bladder.  What are the symptoms of a urinary tract infection? Some of the common symptoms of a urinary tract infection are:  Burning or pain in the lower abdomen (that is, below the stomach)  Fever  Bloody urine may be a sign of infection, but is also caused by other problems  Burning during urination or an increase in the  frequency of urination after the catheter is removed. Sometimes people with catheter-associated urinary tract infections do not have these symptoms of infection. Can catheter-associated urinary tract infections be treated? Yes, most catheter-associated urinary tract infections can be treated with antibiotics and removal or change of the catheter. Your doctor will determine which antibiotic is best for you.  What are some of the things that  hospitals are doing to prevent catheter-associated urinary tract infections? To prevent urinary tract infections, doctors and nurses take the following actions.  Catheter insertion  External catheters in men (these look like condoms and are placed over the penis rather than into the penis)  Putting a temporary catheter in to drain the urine and removing it right away. This is called intermittent urethral catheterization. Catheter care What can I do to help prevent catheter-associated urinary tract infections if I have a catheter?  Always clean your hands before and after doing catheter care.  Always keep your urine bag below the level of your bladder.  Do not tug or pull on the tubing.  Do not twist or kink the catheter tubing.  Ask your healthcare provider each day if you still need the catheter. What do I need to do when I go home from the hospital?  If you will be going home with a catheter, your doctor or nurse should explain everything you need to know about taking care of the catheter. Make sure you understand how to care for it before you leave the hospital.  If you develop any of the symptoms of a urinary tract infection, such as burning or pain in the lower abdomen, fever, or an increase in the frequency of urination, contact your doctor or nurse immediately.  Before you go home, make sure you know who to contact if you have questions or problems after you get home. If you have questions, please ask your doctor or nurse. Developed and co-sponsored by [employer] for [employer] [phone]); Infectious Diseases Society of [place] (IDSA); [place]; Association for Professionals in Infection Control and Epidemiology (APIC); Centers for Disease Control and Prevention (CDC); and [employer].   This information is not intended to replace advice given to you by your health care provider. Make sure you discuss any questions you have with  your health care provider.   Document Released: 03/11/2012 Document Revised: 11/01/2014 Document Reviewed: 08/31/2014 Elsevier Interactive Patient Education [employer].

## 2016-01-04 NOTE — ED Provider Notes (Signed)
CSN: [id number]     Arrival date & time 01/04/16  1127 History   First MD Initiated Contact with Patient 01/04/16 1246     Chief Complaint  Patient presents with  . Urinary Tract Infection   (Consider location/radiation/quality/duration/timing/severity/associated sxs/prior Treatment) HPI Comments: 55 year old female complaining of a malodorous urine with urinary urgency. Denies urinary frequency or dysuria. Denies vaginal discharge. She states this is typical of her UTIs. Denies abdominal pain, nausea or vomiting, fever or chills  Patient is a 55 y.o. female presenting with urinary tract infection.  Urinary Tract Infection Associated symptoms: no flank pain and no vaginal discharge     Past Medical History  Diagnosis Date  . Hypertension   . History of migraine headaches   . Hyperlipidemia   . Depression    History reviewed. No pertinent past surgical history. Family History  Problem Relation Age of Onset  . Hypertension Mother   . Hypertension Father   . Cancer Father   . Diabetes Father    Social History  Substance Use Topics  . Smoking status: Former Research scientist (life sciences)  . Smokeless tobacco: Former Systems developer    Quit date: 10/26/1992  . Alcohol Use: 0.0 oz/week    0 Standard drinks or equivalent per week     Comment: rare occasion    OB History    Gravida Para Term Preterm AB TAB SAB Ectopic Multiple Living   3 1 1  2     1      Review of Systems  Constitutional: Negative.   Respiratory: Negative.   Genitourinary: Positive for urgency. Negative for dysuria, frequency, flank pain, vaginal bleeding, vaginal discharge and pelvic pain.  Musculoskeletal: Negative.   Neurological: Negative.   All other systems reviewed and are negative.   Allergies  Lisinopril  Home Medications   Prior to Admission medications   Medication Sig Start Date End Date Taking? Authorizing Provider  calcium carbonate 1250 MG capsule Take 1,250 mg by mouth daily.   Yes Historical Provider, MD   cholecalciferol (VITAMIN D) 1000 UNITS tablet Take 3,000 Units by mouth daily.   Yes Historical Provider, MD  citalopram (CELEXA) 40 MG tablet Take 40 mg by mouth daily.   Yes Historical Provider, MD  Multiple Vitamins-Minerals (MULTIVITAMIN WITH MINERALS) tablet Take 1 tablet by mouth daily.   Yes Historical Provider, MD  rosuvastatin (CRESTOR) 5 MG tablet Take 5 mg by mouth daily.   Yes Historical Provider, MD  telmisartan (MICARDIS) 40 MG tablet Take 40 mg by mouth daily.   Yes Historical Provider, MD  cephALEXin (KEFLEX) 500 MG capsule Take 1 capsule (500 mg total) by mouth 4 (four) times daily. 01/04/16   Janne Napoleon, NP  gabapentin (NEURONTIN) 300 MG capsule Take 300 mg by mouth 3 (three) times daily.    Historical Provider, MD   Meds Ordered and Administered this Visit  Medications - No data to display  BP 130/70 mmHg  Pulse 60  Temp(Src) 98.6 F (37 C) (Oral)  Resp 16  SpO2 99% No data found.   Physical Exam  Constitutional: She is oriented to person, place, and time. She appears well-developed and well-nourished. No distress.  Eyes: EOM are normal.  Neck: Normal range of motion. Neck supple.  Cardiovascular: Normal rate.   Pulmonary/Chest: Effort normal. No respiratory distress.  Musculoskeletal: She exhibits no edema.  Neurological: She is alert and oriented to person, place, and time. She exhibits normal muscle tone.  Skin: Skin is warm and dry.  Psychiatric: She has  a normal mood and affect.  Nursing note and vitals reviewed.   ED Course  Procedures (including critical care time)  Labs Review Labs Reviewed  POCT URINALYSIS DIP (DEVICE) - Abnormal; Notable for the following:    Leukocytes, UA TRACE (*)    All other components within normal limits  URINE CULTURE   Results for orders placed or performed during the hospital encounter of 01/04/16  POCT urinalysis dip (device)  Result Value Ref Range   Glucose, UA NEGATIVE NEGATIVE mg/dL   Bilirubin Urine NEGATIVE  NEGATIVE   Ketones, ur NEGATIVE NEGATIVE mg/dL   Specific Gravity, Urine 1.015 1.005 - 1.030   Hgb urine dipstick NEGATIVE NEGATIVE   pH 7.0 5.0 - 8.0   Protein, ur NEGATIVE NEGATIVE mg/dL   Urobilinogen, UA 0.2 0.0 - 1.0 mg/dL   Nitrite NEGATIVE NEGATIVE   Leukocytes, UA TRACE (A) NEGATIVE     Imaging Review No results found.   Visual Acuity Review  Right Eye Distance:   Left Eye Distance:   Bilateral Distance:    Right Eye Near:   Left Eye Near:    Bilateral Near:         MDM   1. UTI (lower urinary tract infection)    Meds ordered this encounter  Medications  . cephALEXin (KEFLEX) 500 MG capsule    Sig: Take 1 capsule (500 mg total) by mouth 4 (four) times daily.    Dispense:  28 capsule    Refill:  0    Order Specific Question:  Supervising Provider    Answer:  Melony Overly [[id number]]   AZO as dir Lots of water Urine cult pending     Janne Napoleon, NP 01/04/16 1318

## 2016-01-05 LAB — URINE CULTURE
CULTURE: NO GROWTH
SPECIAL REQUESTS: NORMAL

## 2017-02-27 ENCOUNTER — Other Ambulatory Visit: Payer: Self-pay | Admitting: Internal Medicine

## 2017-02-27 DIAGNOSIS — E2839 Other primary ovarian failure: Secondary | ICD-10-CM

## 2017-11-05 ENCOUNTER — Ambulatory Visit: Payer: Managed Care, Other (non HMO) | Admitting: Obstetrics

## 2017-11-08 DIAGNOSIS — J020 Streptococcal pharyngitis: Secondary | ICD-10-CM | POA: Diagnosis not present

## 2017-11-08 DIAGNOSIS — J029 Acute pharyngitis, unspecified: Secondary | ICD-10-CM | POA: Diagnosis not present

## 2017-11-10 DIAGNOSIS — M8589 Other specified disorders of bone density and structure, multiple sites: Secondary | ICD-10-CM | POA: Diagnosis not present

## 2017-11-10 DIAGNOSIS — Z780 Asymptomatic menopausal state: Secondary | ICD-10-CM | POA: Diagnosis not present

## 2017-12-08 ENCOUNTER — Ambulatory Visit (INDEPENDENT_AMBULATORY_CARE_PROVIDER_SITE_OTHER): Payer: 59 | Admitting: Obstetrics

## 2017-12-08 ENCOUNTER — Encounter: Payer: Self-pay | Admitting: Obstetrics

## 2017-12-08 VITALS — BP 129/84 | HR 62 | Ht 65.0 in | Wt 211.9 lb

## 2017-12-08 DIAGNOSIS — Z1151 Encounter for screening for human papillomavirus (HPV): Secondary | ICD-10-CM | POA: Diagnosis not present

## 2017-12-08 DIAGNOSIS — Z01419 Encounter for gynecological examination (general) (routine) without abnormal findings: Secondary | ICD-10-CM | POA: Diagnosis not present

## 2017-12-08 DIAGNOSIS — Z113 Encounter for screening for infections with a predominantly sexual mode of transmission: Secondary | ICD-10-CM

## 2017-12-08 DIAGNOSIS — Z124 Encounter for screening for malignant neoplasm of cervix: Secondary | ICD-10-CM

## 2017-12-08 DIAGNOSIS — E6609 Other obesity due to excess calories: Secondary | ICD-10-CM

## 2017-12-08 DIAGNOSIS — R140 Abdominal distension (gaseous): Secondary | ICD-10-CM

## 2017-12-08 DIAGNOSIS — Z6835 Body mass index (BMI) 35.0-35.9, adult: Secondary | ICD-10-CM

## 2017-12-08 NOTE — Progress Notes (Signed)
Patient presents for Annual Exam  Last pap:04/10/2015 WNL   Mammogram: 03/2017 WNL   Colonoscopy: at age 57 WNL   STD Screening: Declines Not active    CC: c/o: bloating , denies any pelvic pain , no dysuria.

## 2017-12-08 NOTE — Progress Notes (Signed)
Subjective:        Danielle Calderon is a 57 y.o. female here for a routine exam.  Current complaints: Bloating.  Personal health questionnaire:  Is patient Ashkenazi Jewish, have a family history of breast and/or ovarian cancer: no Is there a family history of uterine cancer diagnosed at age < 13, gastrointestinal cancer, urinary tract cancer, family member who is a Field seismologist syndrome-associated carrier: no Is the patient overweight and hypertensive, family history of diabetes, personal history of gestational diabetes, preeclampsia or PCOS: no Is patient over 78, have PCOS,  family history of premature CHD under age 51, diabetes, smoke, have hypertension or peripheral artery disease:  no At any time, has a partner hit, kicked or otherwise hurt or frightened you?: no Over the past 2 weeks, have you felt down, depressed or hopeless?: no Over the past 2 weeks, have you felt little interest or pleasure in doing things?:no   Gynecologic History No LMP recorded. Patient is postmenopausal. Contraception: post menopausal status Last Pap: 2015. Results were: normal Last mammogram: 2018. Results were: normal  Obstetric History OB History  Gravida Para Term Preterm AB Living  3 1 1   2 1   SAB TAB Ectopic Multiple Live Births          1    # Outcome Date GA Lbr Len/2nd Weight Sex Delivery Anes PTL Lv  3 Term     F Vag-Spont   LIV  2 AB           1 AB             Past Medical History:  Diagnosis Date  . Depression   . History of migraine headaches   . Hyperlipidemia   . Hypertension     History reviewed. No pertinent surgical history.   Current Outpatient Medications:  .  calcium carbonate 1250 MG capsule, Take 1,250 mg by mouth daily., Disp: , Rfl:  .  cephALEXin (KEFLEX) 500 MG capsule, Take 1 capsule (500 mg total) by mouth 4 (four) times daily., Disp: 28 capsule, Rfl: 0 .  cephALEXin (KEFLEX) 500 MG capsule, Take 1 capsule (500 mg total) by mouth 4 (four) times daily., Disp: 28  capsule, Rfl: 0 .  cholecalciferol (VITAMIN D) 1000 UNITS tablet, Take 3,000 Units by mouth daily., Disp: , Rfl:  .  citalopram (CELEXA) 40 MG tablet, Take 40 mg by mouth daily., Disp: , Rfl:  .  gabapentin (NEURONTIN) 300 MG capsule, Take 300 mg by mouth 3 (three) times daily., Disp: , Rfl:  .  Multiple Vitamins-Minerals (MULTIVITAMIN WITH MINERALS) tablet, Take 1 tablet by mouth daily., Disp: , Rfl:  .  rosuvastatin (CRESTOR) 5 MG tablet, Take 5 mg by mouth daily., Disp: , Rfl:  .  telmisartan (MICARDIS) 40 MG tablet, Take 40 mg by mouth daily., Disp: , Rfl:  Allergies  Allergen Reactions  . Lisinopril Cough    Social History   Tobacco Use  . Smoking status: Former Research scientist (life sciences)  . Smokeless tobacco: Former Systems developer    Quit date: 10/26/1992  Substance Use Topics  . Alcohol use: Yes    Alcohol/week: 0.0 oz    Comment: rare occasion     Family History  Problem Relation Age of Onset  . Hypertension Mother   . Hypertension Father   . Cancer Father   . Diabetes Father   . Breast cancer Maternal Aunt   . AAA (abdominal aortic aneurysm) Maternal Aunt       Review of Systems  Constitutional: negative for fatigue and weight loss Respiratory: negative for cough and wheezing Cardiovascular: negative for chest pain, fatigue and palpitations Gastrointestinal: negative for abdominal pain and change in bowel habits Musculoskeletal:negative for myalgias Neurological: negative for gait problems and tremors Behavioral/Psych: negative for abusive relationship, depression Endocrine: negative for temperature intolerance    Genitourinary:negative for abnormal menstrual periods, genital lesions, hot flashes, sexual problems and vaginal discharge Integument/breast: negative for breast lump, breast tenderness, nipple discharge and skin lesion(s)    Objective:       BP 129/84   Pulse 62   Ht 5\' 5"  (1.651 m)   Wt 211 lb 14.4 oz (96.1 kg)   BMI 35.26 kg/m  General:   alert  Skin:   no rash or  abnormalities  Lungs:   clear to auscultation bilaterally  Heart:   regular rate and rhythm, S1, S2 normal, no murmur, click, rub or gallop  Breasts:   normal without suspicious masses, skin or nipple changes or axillary nodes  Abdomen:  normal findings: no organomegaly, soft, non-tender and no hernia  Pelvis:  External genitalia: normal general appearance Urinary system: urethral meatus normal and bladder without fullness, nontender Vaginal: normal without tenderness, induration or masses Cervix: normal appearance Adnexa: normal bimanual exam Uterus: anteverted and non-tender, normal size   Lab Review Urine pregnancy test Labs reviewed yes Radiologic studies reviewed yes  50% of 20 min visit spent on counseling and coordination of care.   Assessment:     1. Encounter for annual routine gynecological examination Rx: - Cervicovaginal ancillary only  2. Screening for cervical cancer Rx: - Cytology - PAP  3. Abdominal bloating Rx: - US PELVIC COMPLETE WITH TRANSVAGINAL; Future  4. Class 2 obesity due to excess calories without serious comorbidity with body mass index (BMI) of 35.0 to 35.9 in adult - program of caloric reduction, exercise and behavioral modification recommended      Plan:    Education reviewed: calcium supplements, depression evaluation, low fat, low cholesterol diet, safe sex/STD prevention, self breast exams and weight bearing exercise. Follow up in: 3 years.   No orders of the defined types were placed in this encounter.  Orders Placed This Encounter  Procedures  . US PELVIC COMPLETE WITH TRANSVAGINAL    Standing Status:   Future    Standing Expiration Date:   02/08/2019    Order Specific Question:   Reason for Exam (SYMPTOM  OR DIAGNOSIS REQUIRED)    Answer:   Bloating    Order Specific Question:   Preferred imaging location?    Answer:   [facility] Simona Huh MD 12-08-2017

## 2017-12-09 LAB — CERVICOVAGINAL ANCILLARY ONLY
Bacterial vaginitis: NEGATIVE
Candida vaginitis: NEGATIVE
Trichomonas: NEGATIVE

## 2017-12-11 LAB — CYTOLOGY - PAP
Diagnosis: NEGATIVE
HPV (WINDOPATH): NOT DETECTED

## 2017-12-16 ENCOUNTER — Ambulatory Visit
Admission: RE | Admit: 2017-12-16 | Discharge: 2017-12-16 | Disposition: A | Discharge status: Home / Self Care | Payer: 59 | Source: Ambulatory Visit | Attending: Obstetrics | Admitting: Obstetrics

## 2017-12-16 DIAGNOSIS — Z87891 Personal history of nicotine dependence: Secondary | ICD-10-CM | POA: Diagnosis not present

## 2017-12-16 DIAGNOSIS — D259 Leiomyoma of uterus, unspecified: Secondary | ICD-10-CM | POA: Insufficient documentation

## 2017-12-16 DIAGNOSIS — K219 Gastro-esophageal reflux disease without esophagitis: Secondary | ICD-10-CM | POA: Diagnosis not present

## 2017-12-16 DIAGNOSIS — R140 Abdominal distension (gaseous): Secondary | ICD-10-CM | POA: Insufficient documentation

## 2017-12-16 DIAGNOSIS — N854 Malposition of uterus: Secondary | ICD-10-CM | POA: Diagnosis not present

## 2017-12-16 DIAGNOSIS — Z7289 Other problems related to lifestyle: Secondary | ICD-10-CM | POA: Diagnosis not present

## 2017-12-25 DIAGNOSIS — B86 Scabies: Secondary | ICD-10-CM | POA: Diagnosis not present

## 2018-02-24 DIAGNOSIS — R7309 Other abnormal glucose: Secondary | ICD-10-CM | POA: Diagnosis not present

## 2018-02-24 DIAGNOSIS — Z0000 Encounter for general adult medical examination without abnormal findings: Secondary | ICD-10-CM | POA: Diagnosis not present

## 2018-02-24 DIAGNOSIS — I10 Essential (primary) hypertension: Secondary | ICD-10-CM | POA: Diagnosis not present

## 2018-02-24 DIAGNOSIS — E782 Mixed hyperlipidemia: Secondary | ICD-10-CM | POA: Diagnosis not present

## 2018-02-24 DIAGNOSIS — E669 Obesity, unspecified: Secondary | ICD-10-CM | POA: Diagnosis not present

## 2018-03-19 DIAGNOSIS — Z23 Encounter for immunization: Secondary | ICD-10-CM | POA: Diagnosis not present

## 2018-03-30 DIAGNOSIS — Z1231 Encounter for screening mammogram for malignant neoplasm of breast: Secondary | ICD-10-CM | POA: Diagnosis not present

## 2018-04-10 ENCOUNTER — Other Ambulatory Visit: Payer: Self-pay | Admitting: Nurse Practitioner

## 2018-04-10 DIAGNOSIS — M810 Age-related osteoporosis without current pathological fracture: Principal | ICD-10-CM

## 2018-04-10 DIAGNOSIS — Z780 Asymptomatic menopausal state: Secondary | ICD-10-CM

## 2018-04-10 MED ORDER — ALENDRONATE SODIUM 70 MG PO TABS: 70.0000 mg | ORAL_TABLET | ORAL | 11 refills | Status: DC

## 2018-04-23 ENCOUNTER — Other Ambulatory Visit: Payer: Self-pay

## 2018-04-23 MED ORDER — GABAPENTIN 300 MG PO CAPS: 300.0000 mg | ORAL_CAPSULE | Freq: Two times a day (BID) | ORAL | 0 refills | Status: DC

## 2018-04-24 ENCOUNTER — Other Ambulatory Visit: Payer: Self-pay

## 2018-05-08 ENCOUNTER — Other Ambulatory Visit: Payer: Self-pay | Admitting: Nurse Practitioner

## 2018-05-08 ENCOUNTER — Other Ambulatory Visit: Payer: Self-pay | Admitting: Internal Medicine

## 2018-05-08 DIAGNOSIS — E785 Hyperlipidemia, unspecified: Secondary | ICD-10-CM

## 2018-05-08 DIAGNOSIS — F32A Depression, unspecified: Secondary | ICD-10-CM

## 2018-05-08 DIAGNOSIS — F329 Major depressive disorder, single episode, unspecified: Secondary | ICD-10-CM

## 2018-05-08 MED ORDER — ROSUVASTATIN CALCIUM 5 MG PO TABS: 5.0000 mg | ORAL_TABLET | Freq: Every day | ORAL | 11 refills | Status: DC

## 2018-05-08 MED ORDER — CITALOPRAM HYDROBROMIDE 40 MG PO TABS: 40.0000 mg | ORAL_TABLET | Freq: Every day | ORAL | 2 refills | Status: DC

## 2018-05-15 ENCOUNTER — Other Ambulatory Visit: Payer: Self-pay | Admitting: Internal Medicine

## 2018-05-15 ENCOUNTER — Other Ambulatory Visit: Payer: Self-pay | Admitting: Nurse Practitioner

## 2018-05-15 DIAGNOSIS — G629 Polyneuropathy, unspecified: Secondary | ICD-10-CM

## 2018-05-15 MED ORDER — GABAPENTIN 300 MG PO CAPS: 300.0000 mg | ORAL_CAPSULE | Freq: Two times a day (BID) | ORAL | 0 refills | Status: DC

## 2018-05-15 NOTE — Telephone Encounter (Signed)
Gabapentin refill

## 2018-05-19 ENCOUNTER — Other Ambulatory Visit: Payer: Self-pay | Admitting: Internal Medicine

## 2018-05-19 NOTE — Progress Notes (Unsigned)
Please check with her pharmacy about the Neurotin Rx Janece sent on 11/15, pt is asking for a refill but she should have enough til 07/15/18.

## 2018-05-21 ENCOUNTER — Other Ambulatory Visit: Payer: Self-pay

## 2018-05-21 NOTE — Telephone Encounter (Signed)
Please call her pharmacy. She was given 2 months worth by Doreene Burke last month.

## 2018-06-11 ENCOUNTER — Other Ambulatory Visit: Payer: Self-pay | Admitting: Nurse Practitioner

## 2018-06-11 MED ORDER — TELMISARTAN 40 MG PO TABS: 40.0000 mg | ORAL_TABLET | Freq: Every day | ORAL | 1 refills | Status: DC

## 2018-06-12 ENCOUNTER — Other Ambulatory Visit: Payer: Self-pay | Admitting: Nurse Practitioner

## 2018-06-12 DIAGNOSIS — G629 Polyneuropathy, unspecified: Secondary | ICD-10-CM

## 2018-07-08 ENCOUNTER — Other Ambulatory Visit: Payer: Self-pay | Admitting: Internal Medicine

## 2018-07-08 DIAGNOSIS — G629 Polyneuropathy, unspecified: Secondary | ICD-10-CM

## 2018-07-08 NOTE — Telephone Encounter (Signed)
Gabapentin refill

## 2018-08-01 IMAGING — US US PELVIS COMPLETE TRANSABD/TRANSVAG
1 series · 13 of 25 positions shown · non-contrast
Comparison: 09/16/2005

CLINICAL DATA: Bloating for 1 year, history of fibroids



[Series 1: us pelvis complete transabd/transvag · 0.24mm/px · 13 of 67 slices shown]
[im 1/67]
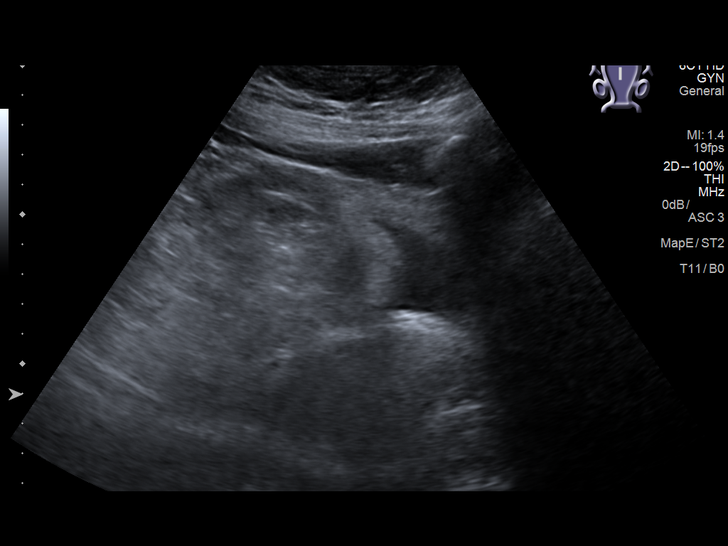
[im 6/67]
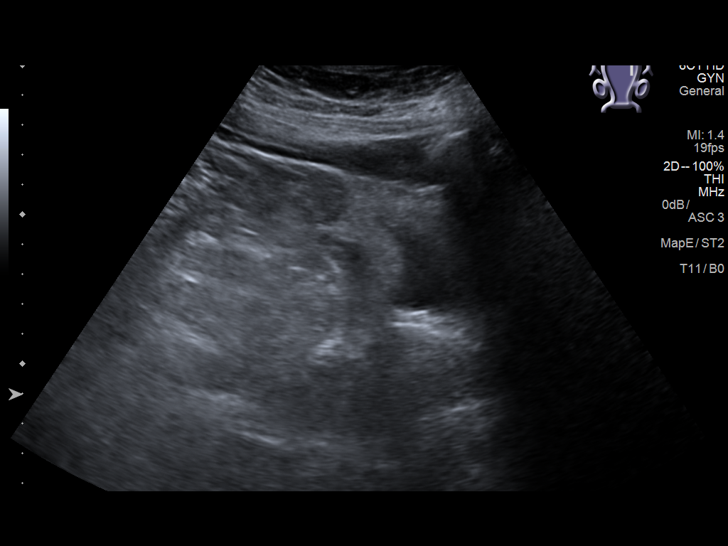
[im 12/67]
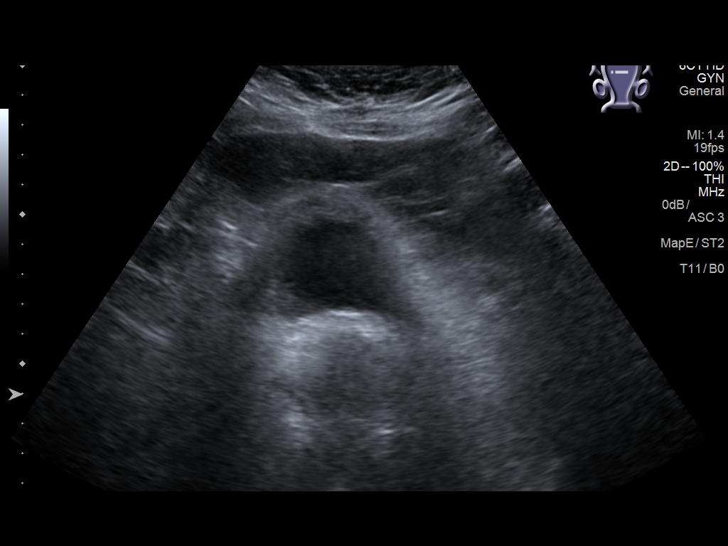
[im 17/67]
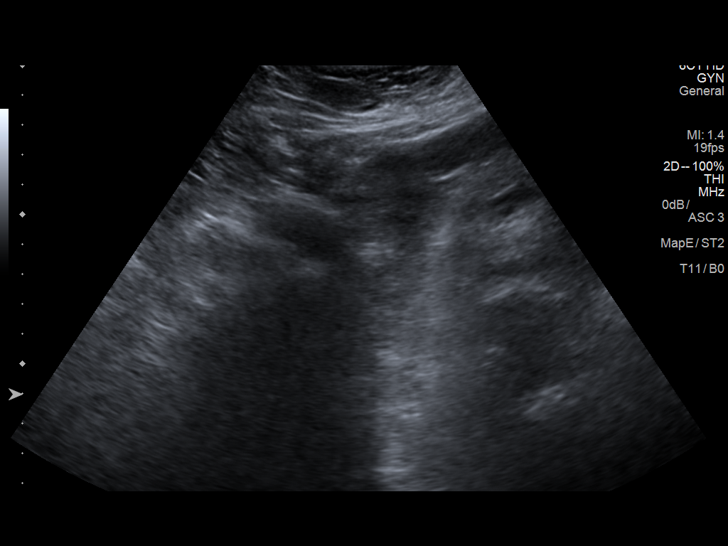
[im 23/67]
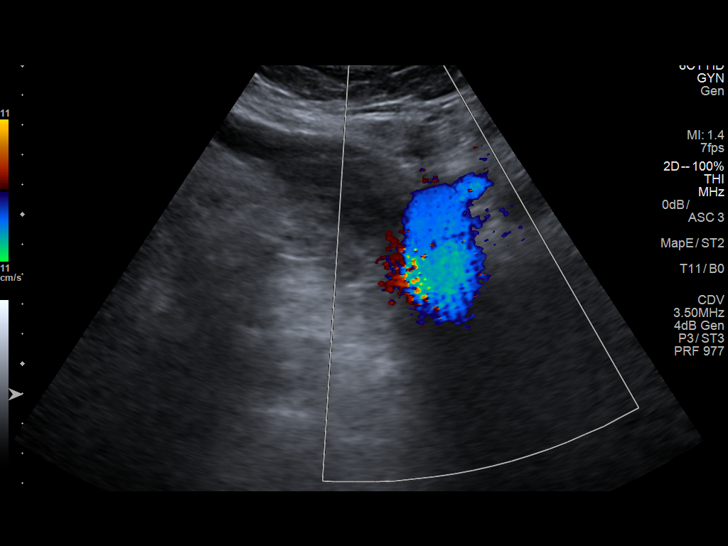
[im 28/67]
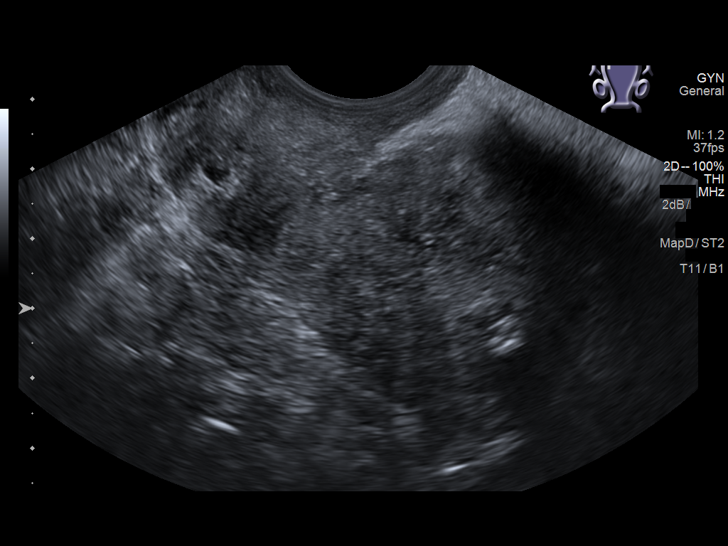
[im 34/67]
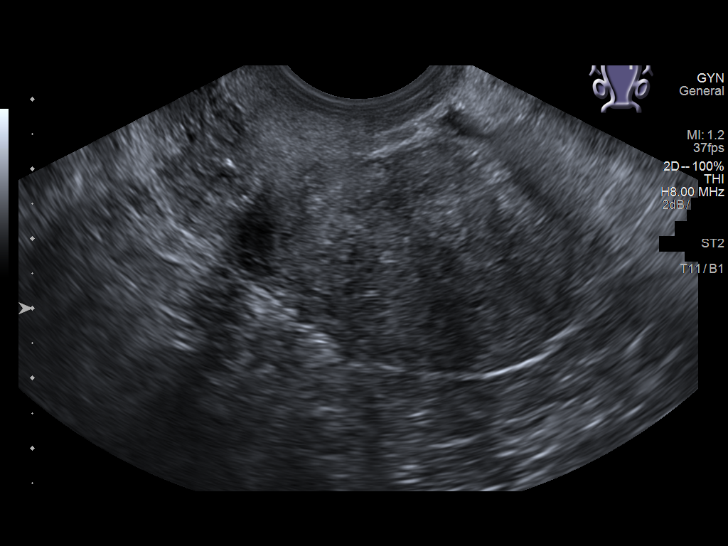
[im 39/67]
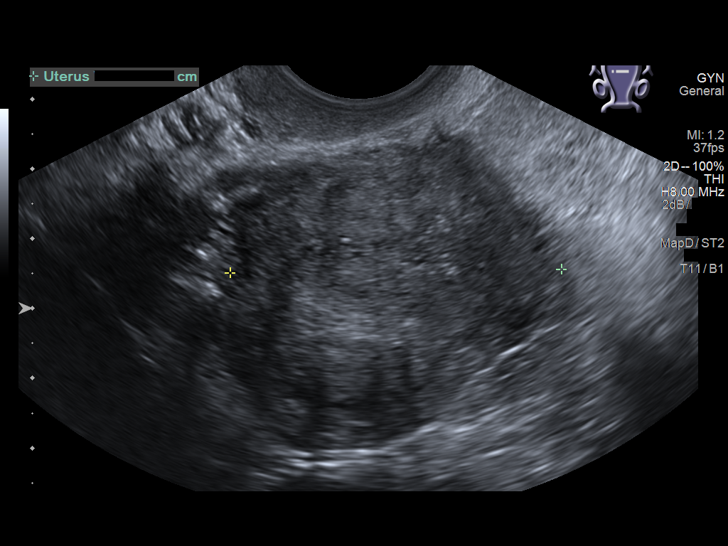
[im 45/67]
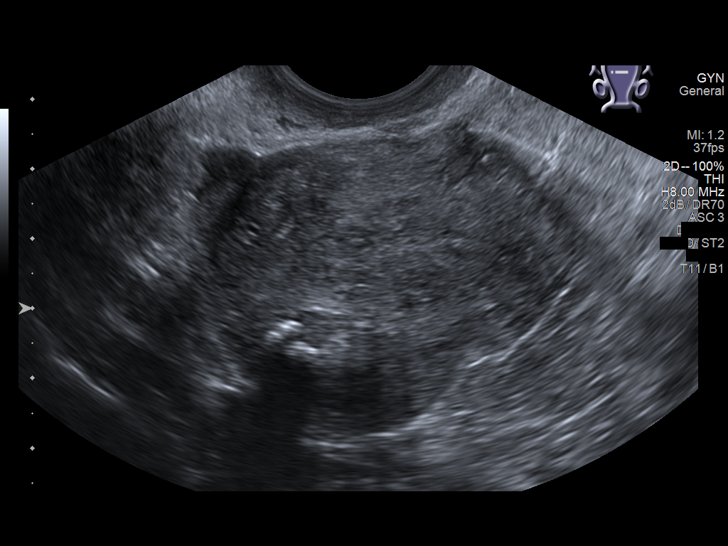
[im 50/67]
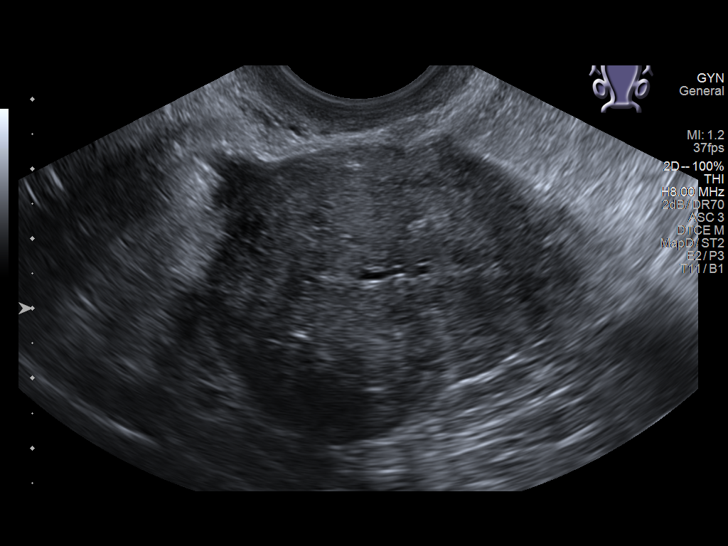
[im 56/67]
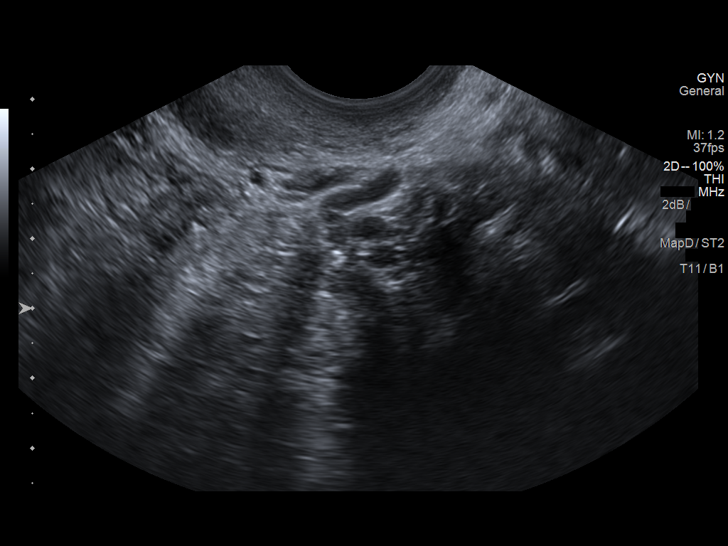
[im 61/67]
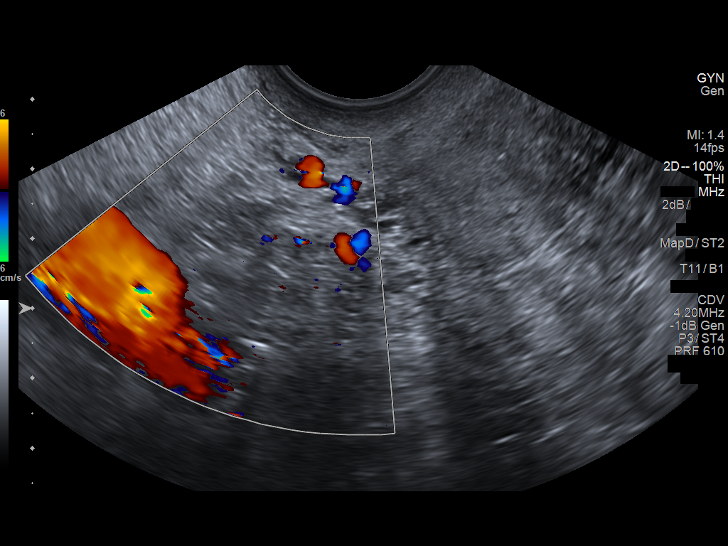
[im 67/67]
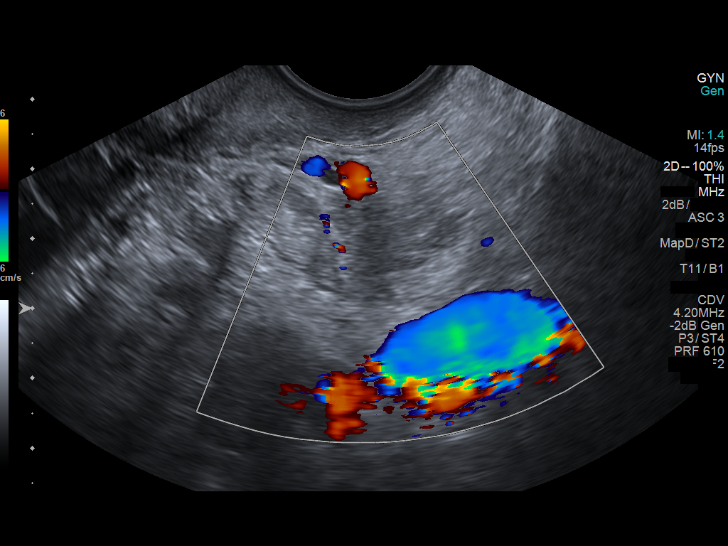

[13 of 25 positions shown; findings below may reference images not displayed]

FINDINGS: Uterus

Measurements: 7.1 x 4.3 x 4.7 cm. Retroverted. Subserosal leiomyoma
at uterine fundus 2.3 x 2.0 x 1.8 cm. Additional small subserosal
leiomyoma at anterior upper to mid uterus 1.4 x 1.2 x 1.2 cm

Endometrium

Thickness: 1 mm thick.  No endometrial fluid or focal abnormality

Right ovary

Not visualized on either transabdominal or endovaginal imaging
suspect obscured by bowel

Left ovary

Not visualized on either transabdominal or endovaginal imaging
suspect obscured by bowel

Other findings

No free pelvic fluid.  No adnexal masses.
IMPRESSION: 2 uterine leiomyomata within a retroverted uterus.

Nonvisualization of ovaries.

## 2018-11-03 ENCOUNTER — Other Ambulatory Visit: Payer: Self-pay | Admitting: Internal Medicine

## 2018-11-03 DIAGNOSIS — G629 Polyneuropathy, unspecified: Secondary | ICD-10-CM

## 2018-11-10 ENCOUNTER — Ambulatory Visit: Payer: Managed Care, Other (non HMO) | Admitting: Nurse Practitioner

## 2018-12-31 ENCOUNTER — Other Ambulatory Visit: Payer: Self-pay | Admitting: Nurse Practitioner

## 2019-01-11 ENCOUNTER — Encounter: Payer: Self-pay | Admitting: Nurse Practitioner

## 2019-01-19 ENCOUNTER — Other Ambulatory Visit: Payer: Self-pay

## 2019-01-19 ENCOUNTER — Ambulatory Visit (INDEPENDENT_AMBULATORY_CARE_PROVIDER_SITE_OTHER): Payer: 59 | Admitting: Nurse Practitioner

## 2019-01-19 ENCOUNTER — Encounter: Payer: Self-pay | Admitting: Nurse Practitioner

## 2019-01-19 VITALS — BP 124/90 | HR 60 | Temp 98.0°F | Ht 64.6 in | Wt 218.4 lb

## 2019-01-19 DIAGNOSIS — R5383 Other fatigue: Secondary | ICD-10-CM | POA: Diagnosis not present

## 2019-01-19 DIAGNOSIS — Z20828 Contact with and (suspected) exposure to other viral communicable diseases: Secondary | ICD-10-CM | POA: Diagnosis not present

## 2019-01-19 DIAGNOSIS — R109 Unspecified abdominal pain: Secondary | ICD-10-CM | POA: Diagnosis not present

## 2019-01-19 DIAGNOSIS — N939 Abnormal uterine and vaginal bleeding, unspecified: Secondary | ICD-10-CM | POA: Diagnosis not present

## 2019-01-19 DIAGNOSIS — Z20822 Contact with and (suspected) exposure to covid-19: Secondary | ICD-10-CM

## 2019-01-19 LAB — POCT URINALYSIS DIPSTICK
Bilirubin, UA: NEGATIVE
Blood, UA: NEGATIVE
Glucose, UA: NEGATIVE
Ketones, UA: NEGATIVE
Nitrite, UA: NEGATIVE
Protein, UA: NEGATIVE
Spec Grav, UA: 1.015 (ref 1.010–1.025)
Urobilinogen, UA: 1 E.U./dL
pH, UA: 7 (ref 5.0–8.0)

## 2019-01-19 MED ORDER — CIPROFLOXACIN HCL 500 MG PO TABS: 500.0000 mg | ORAL_TABLET | Freq: Two times a day (BID) | ORAL | 0 refills | Status: AC

## 2019-01-19 NOTE — Progress Notes (Signed)
Subjective:     Patient ID: Danielle Calderon , female    DOB: 18-Jan-1961 , 58 y.o.   MRN: [id number]   Chief Complaint  Patient presents with  . Vaginal Bleeding    patient stated this morning when she wiped she saw some blood and the tissue. she stated she has some discomfort in her side and has noticed some odor after urinating. pt also stated she has been having some lower abdominal pain    HPI  Vaginal Bleeding The patient's pertinent negatives include no genital odor or vaginal discharge. Primary symptoms comment: when wiping vaginal area this morning saw bright red blood . This is a new problem. The current episode started today. Affected Side: left side flank pain. Associated symptoms include flank pain and nausea. Pertinent negatives include no abdominal pain, diarrhea, headaches, hematuria, sore throat, urgency or vomiting. She is not sexually active. She uses nothing for contraception. There is no history of an abdominal surgery.     Past Medical History:  Diagnosis Date  . Depression   . History of migraine headaches   . Hyperlipidemia   . Hypertension      Family History  Problem Relation Age of Onset  . Hypertension Mother   . Hypertension Father   . Cancer Father   . Diabetes Father   . Breast cancer Maternal Aunt   . AAA (abdominal aortic aneurysm) Maternal Aunt      Current Outpatient Medications:  .  alendronate (FOSAMAX) 70 MG tablet, Take 1 tablet (70 mg total) by mouth every 7 (seven) days. Take with a full glass of water on an empty stomach., Disp: 4 tablet, Rfl: 11 .  calcium carbonate 1250 MG capsule, Take 1,250 mg by mouth daily., Disp: , Rfl:  .  cholecalciferol (VITAMIN D) 1000 UNITS tablet, Take 3,000 Units by mouth daily., Disp: , Rfl:  .  citalopram (CELEXA) 40 MG tablet, TAKE 1 TABLET BY MOUTH EVERY DAY, Disp: 30 tablet, Rfl: 5 .  gabapentin (NEURONTIN) 300 MG capsule, TAKE 1 CAPSULE BY MOUTH TWICE A DAY, Disp: 60 capsule, Rfl: 3 .  rosuvastatin  (CRESTOR) 5 MG tablet, TAKE 1 TABLET BY MOUTH EVERY DAY, Disp: 30 tablet, Rfl: 2 .  telmisartan (MICARDIS) 40 MG tablet, TAKE 1 TABLET BY MOUTH EVERY DAY, Disp: 30 tablet, Rfl: 0   Allergies  Allergen Reactions  . Lisinopril Cough     Review of Systems  HENT: Negative for sore throat.   Gastrointestinal: Positive for nausea. Negative for abdominal pain, diarrhea and vomiting.  Genitourinary: Positive for flank pain and vaginal bleeding. Negative for hematuria, urgency and vaginal discharge.  Neurological: Negative for headaches.     Today's Vitals   01/19/19 1031  BP: 124/90  Pulse: 60  Temp: 98 F (36.7 C)  TempSrc: Oral  Weight: 218 lb 6.4 oz (99.1 kg)  Height: 5' 4.6" (1.641 m)  PainSc: 2   PainLoc: Abdomen   Body mass index is 36.8 kg/m.   Objective:  Physical Exam Vitals signs reviewed.  Constitutional:      Appearance: Normal appearance.  Cardiovascular:     Rate and Rhythm: Normal rate and regular rhythm.     Pulses: Normal pulses.     Heart sounds: Normal heart sounds. No murmur.  Pulmonary:     Effort: Pulmonary effort is normal.     Breath sounds: Normal breath sounds.  Abdominal:     General: Abdomen is flat. Bowel sounds are normal. There is no distension.  Palpations: Abdomen is soft.     Tenderness: There is no abdominal tenderness. There is no right CVA tenderness or left CVA tenderness.  Skin:    General: Skin is warm and dry.     Capillary Refill: Capillary refill takes less than 2 seconds.  Neurological:     Mental Status: She is alert.         Assessment And Plan:     1. Flank pain  Negative CVA tenderness bilaterally  Will send urine for culture  Encouraged to increase her water intake - POCT Urinalysis Dipstick (81002) - Culture, Urine - CMP14 + Anion Gap  2. 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

## 2019-01-20 ENCOUNTER — Encounter: Payer: Self-pay | Admitting: Nurse Practitioner

## 2019-01-20 LAB — CBC WITH DIFFERENTIAL/PLATELET
Basophils Absolute: 0 10*3/uL (ref 0.0–0.2)
Basos: 1 %
EOS (ABSOLUTE): 0.1 10*3/uL (ref 0.0–0.4)
Eos: 3 %
Hematocrit: 37.5 % (ref 34.0–46.6)
Hemoglobin: 12.2 g/dL (ref 11.1–15.9)
Immature Grans (Abs): 0 10*3/uL (ref 0.0–0.1)
Immature Granulocytes: 0 %
Lymphocytes Absolute: 2.1 10*3/uL (ref 0.7–3.1)
Lymphs: 47 %
MCH: 25.6 pg — ABNORMAL LOW (ref 26.6–33.0)
MCHC: 32.5 g/dL (ref 31.5–35.7)
MCV: 79 fL (ref 79–97)
Monocytes Absolute: 0.3 10*3/uL (ref 0.1–0.9)
Monocytes: 6 %
Neutrophils Absolute: 1.9 10*3/uL (ref 1.4–7.0)
Neutrophils: 43 %
Platelets: 237 10*3/uL (ref 150–450)
RBC: 4.76 x10E6/uL (ref 3.77–5.28)
RDW: 14.2 % (ref 11.7–15.4)
WBC: 4.3 10*3/uL (ref 3.4–10.8)

## 2019-01-20 LAB — URINE CULTURE

## 2019-01-20 LAB — CMP14 + ANION GAP
ALT: 15 IU/L (ref 0–32)
AST: 15 IU/L (ref 0–40)
Albumin/Globulin Ratio: 1.3 (ref 1.2–2.2)
Albumin: 4 g/dL (ref 3.8–4.9)
Alkaline Phosphatase: 56 IU/L (ref 39–117)
Anion Gap: 15 mmol/L (ref 10.0–18.0)
BUN/Creatinine Ratio: 9 (ref 9–23)
BUN: 8 mg/dL (ref 6–24)
Bilirubin Total: 0.3 mg/dL (ref 0.0–1.2)
CO2: 24 mmol/L (ref 20–29)
Calcium: 8.5 mg/dL — ABNORMAL LOW (ref 8.7–10.2)
Chloride: 103 mmol/L (ref 96–106)
Creatinine, Ser: 0.93 mg/dL (ref 0.57–1.00)
GFR calc Af Amer: 78 mL/min/{1.73_m2} (ref >59)
GFR calc non Af Amer: 67 mL/min/{1.73_m2} (ref >59)
Globulin, Total: 3.1 g/dL (ref 1.5–4.5)
Glucose: 114 mg/dL — ABNORMAL HIGH (ref 65–99)
Potassium: 4.1 mmol/L (ref 3.5–5.2)
Sodium: 142 mmol/L (ref 134–144)
Total Protein: 7.1 g/dL (ref 6.0–8.5)

## 2019-01-20 LAB — HEMOGLOBIN A1C
Est. average glucose Bld gHb Est-mCnc: 148 mg/dL
Hgb A1c MFr Bld: 6.8 % — ABNORMAL HIGH (ref 4.8–5.6)

## 2019-01-22 LAB — NOVEL CORONAVIRUS, NAA: SARS-CoV-2, NAA: NOT DETECTED

## 2019-01-25 ENCOUNTER — Other Ambulatory Visit: Payer: Self-pay | Admitting: Nurse Practitioner

## 2019-02-22 ENCOUNTER — Other Ambulatory Visit: Payer: Self-pay | Admitting: Nurse Practitioner

## 2019-02-22 DIAGNOSIS — I10 Essential (primary) hypertension: Secondary | ICD-10-CM

## 2019-02-22 MED ORDER — VALSARTAN 160 MG PO TABS: 160.0000 mg | ORAL_TABLET | Freq: Every day | ORAL | 2 refills | Status: DC

## 2019-02-25 ENCOUNTER — Other Ambulatory Visit: Payer: Self-pay

## 2019-02-25 DIAGNOSIS — G629 Polyneuropathy, unspecified: Secondary | ICD-10-CM

## 2019-02-25 MED ORDER — GABAPENTIN 300 MG PO CAPS: 300.0000 mg | ORAL_CAPSULE | Freq: Two times a day (BID) | ORAL | 0 refills | Status: DC

## 2019-02-26 ENCOUNTER — Other Ambulatory Visit: Payer: Self-pay | Admitting: Nurse Practitioner

## 2019-03-02 ENCOUNTER — Telehealth: Payer: Self-pay

## 2019-03-02 ENCOUNTER — Other Ambulatory Visit: Payer: Self-pay

## 2019-03-02 MED ORDER — TELMISARTAN 40 MG PO TABS: 40.0000 mg | ORAL_TABLET | Freq: Every day | ORAL | 0 refills | Status: DC

## 2019-03-02 NOTE — Telephone Encounter (Signed)
Patient called stating she was sent in valsartan for her bp med and she was taking micardis. I have called pharmacy to see when was the last time she got a refill on micardis because I dont see it anywhere in our system that we have changed her med. Patient has not been seen for her bp since 01/2018 so I have scheduled her an appointment to be seen for HTN. YRL,RMA

## 2019-03-03 ENCOUNTER — Encounter: Payer: Managed Care, Other (non HMO) | Admitting: Nurse Practitioner

## 2019-03-05 ENCOUNTER — Other Ambulatory Visit: Payer: Self-pay | Admitting: Internal Medicine

## 2019-03-11 ENCOUNTER — Other Ambulatory Visit: Payer: Self-pay

## 2019-03-11 ENCOUNTER — Ambulatory Visit: Payer: 59 | Admitting: Nurse Practitioner

## 2019-03-21 ENCOUNTER — Other Ambulatory Visit: Payer: Self-pay | Admitting: Nurse Practitioner

## 2019-03-21 DIAGNOSIS — Z780 Asymptomatic menopausal state: Secondary | ICD-10-CM

## 2019-03-24 ENCOUNTER — Encounter: Payer: 59 | Admitting: Nurse Practitioner

## 2019-04-02 ENCOUNTER — Other Ambulatory Visit: Payer: Self-pay | Admitting: Nurse Practitioner

## 2019-04-09 DIAGNOSIS — Z23 Encounter for immunization: Secondary | ICD-10-CM | POA: Diagnosis not present

## 2019-04-29 ENCOUNTER — Encounter: Payer: Self-pay | Admitting: Nurse Practitioner

## 2019-05-12 DIAGNOSIS — Z803 Family history of malignant neoplasm of breast: Secondary | ICD-10-CM | POA: Diagnosis not present

## 2019-05-12 DIAGNOSIS — Z1231 Encounter for screening mammogram for malignant neoplasm of breast: Secondary | ICD-10-CM | POA: Diagnosis not present

## 2019-05-12 LAB — HM MAMMOGRAPHY

## 2019-05-17 ENCOUNTER — Encounter: Payer: Self-pay | Admitting: Nurse Practitioner

## 2019-05-31 ENCOUNTER — Other Ambulatory Visit: Payer: Self-pay

## 2019-05-31 ENCOUNTER — Ambulatory Visit (INDEPENDENT_AMBULATORY_CARE_PROVIDER_SITE_OTHER): Payer: 59 | Admitting: Obstetrics

## 2019-05-31 ENCOUNTER — Encounter: Payer: Self-pay | Admitting: Obstetrics

## 2019-05-31 VITALS — BP 127/83 | HR 62 | Ht 60.0 in | Wt 216.0 lb

## 2019-05-31 DIAGNOSIS — Z113 Encounter for screening for infections with a predominantly sexual mode of transmission: Secondary | ICD-10-CM | POA: Diagnosis not present

## 2019-05-31 DIAGNOSIS — N760 Acute vaginitis: Secondary | ICD-10-CM | POA: Diagnosis not present

## 2019-05-31 DIAGNOSIS — B9689 Other specified bacterial agents as the cause of diseases classified elsewhere: Secondary | ICD-10-CM | POA: Diagnosis not present

## 2019-05-31 DIAGNOSIS — Z1151 Encounter for screening for human papillomavirus (HPV): Secondary | ICD-10-CM

## 2019-05-31 DIAGNOSIS — Z124 Encounter for screening for malignant neoplasm of cervix: Secondary | ICD-10-CM

## 2019-05-31 DIAGNOSIS — N898 Other specified noninflammatory disorders of vagina: Secondary | ICD-10-CM | POA: Diagnosis not present

## 2019-05-31 DIAGNOSIS — Z01419 Encounter for gynecological examination (general) (routine) without abnormal findings: Secondary | ICD-10-CM | POA: Diagnosis not present

## 2019-05-31 DIAGNOSIS — Z8742 Personal history of other diseases of the female genital tract: Secondary | ICD-10-CM

## 2019-05-31 DIAGNOSIS — E66813 Obesity, class 3: Secondary | ICD-10-CM

## 2019-05-31 NOTE — Progress Notes (Signed)
Subjective:        Danielle Calderon is a 58 y.o. female here for a routine exam.  Current complaints: Vaginal spotting occasionally since July 2020.  Denies vaginal discharge / irritation, pelvic pain, dysuria or hematuria.  Personal health questionnaire:  Is patient Ashkenazi Jewish, have a family history of breast and/or ovarian cancer: yes Is there a family history of uterine cancer diagnosed at age < 52, gastrointestinal cancer, urinary tract cancer, family member who is a Field seismologist syndrome-associated carrier: no Is the patient overweight and hypertensive, family history of diabetes, personal history of gestational diabetes, preeclampsia or PCOS: no Is patient over 66, have PCOS,  family history of premature CHD under age 60, diabetes, smoke, have hypertension or peripheral artery disease:  yes At any time, has a partner hit, kicked or otherwise hurt or frightened you?: no Over the past 2 weeks, have you felt down, depressed or hopeless?: no Over the past 2 weeks, have you felt little interest or pleasure in doing things?:no   Gynecologic History No LMP recorded. Patient is postmenopausal. Contraception: post menopausal status Last Pap: 12-08-2017. Results were: normal Last mammogram: 05-12-2019. Results were: normal  Obstetric History OB History  Gravida Para Term Preterm AB Living  3 1 1   2 1   SAB TAB Ectopic Multiple Live Births          1    # Outcome Date GA Lbr Len/2nd Weight Sex Delivery Anes PTL Lv  3 Term     F Vag-Spont   LIV  2 AB           1 AB             Past Medical History:  Diagnosis Date  . Depression   . History of migraine headaches   . Hyperlipidemia   . Hypertension     History reviewed. No pertinent surgical history.   Current Outpatient Medications:  .  calcium carbonate 1250 MG capsule, Take 1,250 mg by mouth daily., Disp: , Rfl:  .  cholecalciferol (VITAMIN D) 1000 UNITS tablet, Take 3,000 Units by mouth daily., Disp: , Rfl:  .  citalopram  (CELEXA) 40 MG tablet, TAKE 1 TABLET BY MOUTH EVERY DAY, Disp: 30 tablet, Rfl: 5 .  gabapentin (NEURONTIN) 300 MG capsule, Take 1 capsule (300 mg total) by mouth 2 (two) times daily., Disp: 60 capsule, Rfl: 0 .  rosuvastatin (CRESTOR) 5 MG tablet, TAKE 1 TABLET BY MOUTH EVERY DAY, Disp: 30 tablet, Rfl: 2 .  telmisartan (MICARDIS) 40 MG tablet, Take 1 tablet (40 mg total) by mouth daily., Disp: 30 tablet, Rfl: 0 .  valsartan (DIOVAN) 160 MG tablet, Take 1 tablet (160 mg total) by mouth daily., Disp: 30 tablet, Rfl: 2 Allergies  Allergen Reactions  . Lisinopril Cough    Social History   Tobacco Use  . Smoking status: Former Research scientist (life sciences)  . Smokeless tobacco: Former Systems developer    Quit date: 10/26/1992  Substance Use Topics  . Alcohol use: Yes    Alcohol/week: 0.0 standard drinks    Comment: rare occasion     Family History  Problem Relation Age of Onset  . Hypertension Mother   . Hypertension Father   . Cancer Father   . Diabetes Father   . Breast cancer Maternal Aunt   . AAA (abdominal aortic aneurysm) Maternal Aunt       Review of Systems  Constitutional: negative for fatigue and weight loss Respiratory: negative for cough and wheezing Cardiovascular: negative  for chest pain, fatigue and palpitations Gastrointestinal: negative for abdominal pain and change in bowel habits Musculoskeletal:negative for myalgias Neurological: negative for gait problems and tremors Behavioral/Psych: negative for abusive relationship, depression Endocrine: negative for temperature intolerance    Genitourinary:negative for abnorma genital lesions, hot flashes, sexual problems and vaginal discharge.  Positive for vaginal spotting occasionally Integument/breast: negative for breast lump, breast tenderness, nipple discharge and skin lesion(s)    Objective:       BP 127/83   Pulse 62   Ht 5' (1.524 m)   Wt 216 lb (98 kg)   BMI 42.18 kg/m  General:   alert  Skin:   no rash or abnormalities  Lungs:    clear to auscultation bilaterally  Heart:   regular rate and rhythm, S1, S2 normal, no murmur, click, rub or gallop  Breasts:   normal without suspicious masses, skin or nipple changes or axillary nodes  Abdomen:  normal findings: no organomegaly, soft, non-tender and no hernia  Pelvis:  External genitalia: normal general appearance Urinary system: urethral meatus normal and bladder without fullness, nontender Vaginal: normal without tenderness, induration or masses Cervix: normal appearance Adnexa: normal bimanual exam Uterus: anteverted and non-tender, normal size   Lab Review Urine pregnancy test Labs reviewed yes Radiologic studies reviewed yes  50% of 25 min visit spent on counseling and coordination of care.   Assessment:     1. Encounter for routine gynecological examination with Papanicolaou smear of cervix  2. History of postmenopausal bleeding Rx: - US PELVIC COMPLETE WITH TRANSVAGINAL; Future  3. Class 3 severe obesity due to excess calories without serious comorbidity with body mass index (BMI) of 40.0 to 44.9 in adult [facility]) - program of caloric restriction, exercise and behavioral modification recommended for weight management   Plan:    Education reviewed: calcium supplements, depression evaluation, low fat, low cholesterol diet, safe sex/STD prevention, self breast exams and weight bearing exercise. Follow up in: 1 year.   No orders of the defined types were placed in this encounter.  Orders Placed This Encounter  Procedures  . US PELVIC COMPLETE WITH TRANSVAGINAL    Standing Status:   Future    Standing Expiration Date:   07/30/2020    Order Specific Question:   Reason for Exam (SYMPTOM  OR DIAGNOSIS REQUIRED)    Answer:   Postmenopausal vaginal bleeding    Order Specific Question:   Preferred imaging location?    Answer:   ID:[id number] Berneice Gandy, MD 05/31/2019 11:50 AM

## 2019-05-31 NOTE — Addendum Note (Signed)
Addended by: Courtney Heys on: 05/31/2019 03:13 PM   Modules accepted: Orders

## 2019-05-31 NOTE — Progress Notes (Signed)
Patient presents for Annual Exam   Last pap: 12/08/2017 WNL   Mammogram:05/12/19 WNL   STD Screening: [place]. Aunt and Cousin on mom side had  Hx of Breast Cancer:   CC:pt noted in July she noticed blood when wiping bleeding had stopped pt noticed once this month but none since and none today.   Pt denies any pain, no dysuria  No constipation.

## 2019-06-01 DIAGNOSIS — Z86010 Personal history of colonic polyps: Secondary | ICD-10-CM | POA: Diagnosis not present

## 2019-06-01 DIAGNOSIS — Z1211 Encounter for screening for malignant neoplasm of colon: Secondary | ICD-10-CM | POA: Diagnosis not present

## 2019-06-01 DIAGNOSIS — K5904 Chronic idiopathic constipation: Secondary | ICD-10-CM | POA: Diagnosis not present

## 2019-06-01 DIAGNOSIS — R635 Abnormal weight gain: Secondary | ICD-10-CM | POA: Diagnosis not present

## 2019-06-01 LAB — CERVICOVAGINAL ANCILLARY ONLY
Bacterial Vaginitis (gardnerella): POSITIVE — AB
Candida Glabrata: NEGATIVE
Candida Vaginitis: NEGATIVE
Chlamydia: NEGATIVE
Comment: NEGATIVE
Comment: NEGATIVE
Comment: NEGATIVE
Comment: NEGATIVE
Comment: NEGATIVE
Comment: NORMAL
Neisseria Gonorrhea: NEGATIVE
Trichomonas: NEGATIVE

## 2019-06-02 ENCOUNTER — Other Ambulatory Visit: Payer: Self-pay | Admitting: Obstetrics

## 2019-06-02 ENCOUNTER — Encounter: Payer: Self-pay | Admitting: Obstetrics

## 2019-06-02 DIAGNOSIS — N760 Acute vaginitis: Secondary | ICD-10-CM

## 2019-06-02 LAB — CYTOLOGY - PAP
Comment: NEGATIVE
Diagnosis: NEGATIVE
High risk HPV: NEGATIVE

## 2019-06-02 MED ORDER — TINIDAZOLE 500 MG PO TABS: 1000.0000 mg | ORAL_TABLET | Freq: Every day | ORAL | 2 refills | Status: DC

## 2019-06-07 ENCOUNTER — Ambulatory Visit
Admission: RE | Admit: 2019-06-07 | Discharge: 2019-06-07 | Disposition: A | Discharge status: Home / Self Care | Payer: 59 | Source: Ambulatory Visit | Attending: Obstetrics | Admitting: Obstetrics

## 2019-06-07 DIAGNOSIS — Z8742 Personal history of other diseases of the female genital tract: Secondary | ICD-10-CM

## 2019-06-07 DIAGNOSIS — D252 Subserosal leiomyoma of uterus: Secondary | ICD-10-CM | POA: Diagnosis not present

## 2019-06-14 ENCOUNTER — Encounter: Payer: Self-pay | Admitting: Obstetrics

## 2019-06-14 ENCOUNTER — Telehealth (INDEPENDENT_AMBULATORY_CARE_PROVIDER_SITE_OTHER): Payer: 59 | Admitting: Obstetrics

## 2019-06-14 ENCOUNTER — Other Ambulatory Visit: Payer: Self-pay

## 2019-06-14 DIAGNOSIS — Z8742 Personal history of other diseases of the female genital tract: Secondary | ICD-10-CM

## 2019-06-14 DIAGNOSIS — Z6841 Body Mass Index (BMI) 40.0 and over, adult: Secondary | ICD-10-CM | POA: Diagnosis not present

## 2019-06-15 ENCOUNTER — Ambulatory Visit (INDEPENDENT_AMBULATORY_CARE_PROVIDER_SITE_OTHER): Payer: 59 | Admitting: Nurse Practitioner

## 2019-06-15 ENCOUNTER — Other Ambulatory Visit: Payer: Self-pay

## 2019-06-15 ENCOUNTER — Encounter: Payer: Self-pay | Admitting: Nurse Practitioner

## 2019-06-15 VITALS — BP 126/80 | HR 64 | Temp 98.1°F | Ht 64.6 in | Wt 217.6 lb

## 2019-06-15 DIAGNOSIS — F329 Major depressive disorder, single episode, unspecified: Secondary | ICD-10-CM

## 2019-06-15 DIAGNOSIS — Z1159 Encounter for screening for other viral diseases: Secondary | ICD-10-CM

## 2019-06-15 DIAGNOSIS — R7309 Other abnormal glucose: Secondary | ICD-10-CM

## 2019-06-15 DIAGNOSIS — F32A Depression, unspecified: Secondary | ICD-10-CM

## 2019-06-15 DIAGNOSIS — I10 Essential (primary) hypertension: Secondary | ICD-10-CM | POA: Diagnosis not present

## 2019-06-15 DIAGNOSIS — R69 Illness, unspecified: Secondary | ICD-10-CM | POA: Diagnosis not present

## 2019-06-15 DIAGNOSIS — E785 Hyperlipidemia, unspecified: Secondary | ICD-10-CM | POA: Diagnosis not present

## 2019-06-15 MED ORDER — ROSUVASTATIN CALCIUM 5 MG PO TABS: 5.0000 mg | ORAL_TABLET | Freq: Every day | ORAL | 1 refills | Status: DC

## 2019-06-15 MED ORDER — TELMISARTAN 40 MG PO TABS: 40.0000 mg | ORAL_TABLET | Freq: Every day | ORAL | 1 refills | Status: DC

## 2019-06-15 MED ORDER — CITALOPRAM HYDROBROMIDE 40 MG PO TABS: 40.0000 mg | ORAL_TABLET | Freq: Every day | ORAL | 1 refills | Status: DC

## 2019-06-15 NOTE — Progress Notes (Signed)
This visit occurred during the SARS-CoV-2 public health emergency.  Safety protocols were in place, including screening questions prior to the visit, additional usage of staff PPE, and extensive cleaning of exam room while observing appropriate contact time as indicated for disinfecting solutions.  Subjective:     Patient ID: Danielle Calderon , female    DOB: 07-05-1960 , 58 y.o.   MRN: [id number]   Chief Complaint  Patient presents with  . Hypertension    HPI  Hypertension This is a chronic problem. The current episode started more than 1 year ago. The problem is unchanged. The problem is controlled. Pertinent negatives include no anxiety, headaches or shortness of breath. There are no associated agents to hypertension. Risk factors for coronary artery disease include obesity and sedentary lifestyle. Past treatments include angiotensin blockers. Compliance problems include exercise and diet.  There is no history of chronic renal disease.     Past Medical History:  Diagnosis Date  . Depression   . History of migraine headaches   . Hyperlipidemia   . Hypertension      Family History  Problem Relation Age of Onset  . Hypertension Mother   . Hypertension Father   . Cancer Father   . Diabetes Father   . Breast cancer Maternal Aunt   . AAA (abdominal aortic aneurysm) Maternal Aunt      Current Outpatient Medications:  .  calcium carbonate 1250 MG capsule, Take 1,250 mg by mouth daily., Disp: , Rfl:  .  cholecalciferol (VITAMIN D) 1000 UNITS tablet, Take 3,000 Units by mouth daily., Disp: , Rfl:  .  citalopram (CELEXA) 40 MG tablet, TAKE 1 TABLET BY MOUTH EVERY DAY, Disp: 30 tablet, Rfl: 5 .  gabapentin (NEURONTIN) 300 MG capsule, Take 1 capsule (300 mg total) by mouth 2 (two) times daily., Disp: 60 capsule, Rfl: 0 .  rosuvastatin (CRESTOR) 5 MG tablet, TAKE 1 TABLET BY MOUTH EVERY DAY, Disp: 30 tablet, Rfl: 2 .  telmisartan (MICARDIS) 40 MG tablet, Take 1 tablet (40 mg total) by  mouth daily., Disp: 90 tablet, Rfl: 1   Allergies  Allergen Reactions  . Lisinopril Cough     Review of Systems  Constitutional: Negative.   Respiratory: Negative.  Negative for shortness of breath.   Cardiovascular: Negative.   Neurological: Negative for dizziness and headaches.     Today's Vitals   06/15/19 1527  BP: 126/80  Pulse: 64  Temp: 98.1 F (36.7 C)  Weight: 217 lb 9.6 oz (98.7 kg)  Height: 5' 4.6" (1.641 m)  PainSc: 0-No pain   Body mass index is 36.66 kg/m.   Objective:  Physical Exam Vitals reviewed.  Constitutional:      Appearance: Normal appearance. She is obese.  Cardiovascular:     Rate and Rhythm: Normal rate and regular rhythm.     Pulses: Normal pulses.     Heart sounds: Normal heart sounds. No murmur.  Pulmonary:     Effort: Pulmonary effort is normal. No respiratory distress.     Breath sounds: Normal breath sounds.  Skin:    Capillary Refill: Capillary refill takes less than 2 seconds.  Neurological:     General: No focal deficit present.     Mental Status: She is alert and oriented to person, place, and time.  Psychiatric:        Mood and Affect: Mood normal.        Behavior: Behavior normal.        Thought Content: Thought content  normal.        Judgment: Judgment normal.         Assessment And Plan:     1. Essential hypertension . B/P is controlled.  . CMP ordered to check renal function.  . The importance of regular exercise and dietary modification was stressed to the patient.  . Stressed importance of losing ten percent of her body weight to help with B/P control.  . The weight loss would help with decreasing cardiac and cancer risk as well.  - CMP14+EGFR  2. Depression, unspecified depression type  Chronic, controlled  Continue with current medications - citalopram (CELEXA) 40 MG tablet; Take 1 tablet (40 mg total) by mouth daily.  Dispense: 90 tablet; Refill: 1  3. Hyperlipidemia, unspecified hyperlipidemia  type  Chronic, controlled  Continue with current medications  Denies myalgias - Lipid Profile  4. Abnormal glucose  Chronic, stable  Continue with current medications  Encouraged to limit intake of sugary foods and drinks  Encouraged to increase physical activity to 150 minutes per week - Hemoglobin A1c  5. Encounter for hepatitis C screening test for low risk patient  Will check for Hepatitis C screening due to being born between the years [birth year] - Hepatitis C antibody  [name], FNP    THE PATIENT IS ENCOURAGED TO PRACTICE SOCIAL DISTANCING DUE TO THE COVID-19 PANDEMIC.

## 2019-06-16 ENCOUNTER — Other Ambulatory Visit: Payer: Self-pay

## 2019-06-16 ENCOUNTER — Other Ambulatory Visit: Payer: Self-pay | Admitting: Internal Medicine

## 2019-06-16 ENCOUNTER — Other Ambulatory Visit: Payer: Self-pay | Admitting: Nurse Practitioner

## 2019-06-16 ENCOUNTER — Encounter: Payer: Self-pay | Admitting: Nurse Practitioner

## 2019-06-16 DIAGNOSIS — Z780 Asymptomatic menopausal state: Secondary | ICD-10-CM

## 2019-06-16 DIAGNOSIS — D125 Benign neoplasm of sigmoid colon: Secondary | ICD-10-CM | POA: Diagnosis not present

## 2019-06-16 DIAGNOSIS — G629 Polyneuropathy, unspecified: Secondary | ICD-10-CM

## 2019-06-16 DIAGNOSIS — K635 Polyp of colon: Secondary | ICD-10-CM | POA: Diagnosis not present

## 2019-06-16 DIAGNOSIS — Z1211 Encounter for screening for malignant neoplasm of colon: Secondary | ICD-10-CM | POA: Diagnosis not present

## 2019-06-16 LAB — CMP14+EGFR
ALT: 15 IU/L (ref 0–32)
AST: 12 IU/L (ref 0–40)
Albumin/Globulin Ratio: 1.2 (ref 1.2–2.2)
Albumin: 4 g/dL (ref 3.8–4.9)
Alkaline Phosphatase: 63 IU/L (ref 39–117)
BUN/Creatinine Ratio: 11 (ref 9–23)
BUN: 9 mg/dL (ref 6–24)
Bilirubin Total: 0.6 mg/dL (ref 0.0–1.2)
CO2: 25 mmol/L (ref 20–29)
Calcium: 9.1 mg/dL (ref 8.7–10.2)
Chloride: 101 mmol/L (ref 96–106)
Creatinine, Ser: 0.83 mg/dL (ref 0.57–1.00)
GFR calc Af Amer: 89 mL/min/1.73
GFR calc non Af Amer: 77 mL/min/1.73
Globulin, Total: 3.3 g/dL (ref 1.5–4.5)
Glucose: 94 mg/dL (ref 65–99)
Potassium: 3.6 mmol/L (ref 3.5–5.2)
Sodium: 142 mmol/L (ref 134–144)
Total Protein: 7.3 g/dL (ref 6.0–8.5)

## 2019-06-16 LAB — LIPID PANEL
Chol/HDL Ratio: 2.6 ratio (ref 0.0–4.4)
Cholesterol, Total: 185 mg/dL (ref 100–199)
HDL: 70 mg/dL (ref >39)
LDL Chol Calc (NIH): 99 mg/dL (ref 0–99)
Triglycerides: 89 mg/dL (ref 0–149)
VLDL Cholesterol Cal: 16 mg/dL (ref 5–40)

## 2019-06-16 LAB — HEMOGLOBIN A1C
Est. average glucose Bld gHb Est-mCnc: 140 mg/dL
Hgb A1c MFr Bld: 6.5 % — ABNORMAL HIGH (ref 4.8–5.6)

## 2019-06-16 LAB — HM COLONOSCOPY

## 2019-06-16 LAB — HEPATITIS C ANTIBODY: Hep C Virus Ab: 0.2 {s_co_ratio} (ref 0.0–0.9)

## 2019-06-16 MED ORDER — TELMISARTAN 40 MG PO TABS: 40.0000 mg | ORAL_TABLET | Freq: Every day | ORAL | 1 refills | Status: DC

## 2019-06-20 ENCOUNTER — Encounter: Payer: Self-pay | Admitting: Nurse Practitioner

## 2019-06-21 ENCOUNTER — Encounter: Payer: Self-pay | Admitting: Obstetrics

## 2019-06-21 NOTE — Progress Notes (Signed)
TELEHEALTH VIRTUAL GYNECOLOGY VISIT ENCOUNTER NOTE  I connected with Danielle Calderon on 06/21/19 at  9:45 AM EST by telephone at home and verified that I am speaking with the correct person using two identifiers.   I discussed the limitations, risks, security and privacy concerns of performing an evaluation and management service by telephone and the availability of in person appointments. I also discussed with the patient that there may be a patient responsible charge related to this service. The patient expressed understanding and agreed to proceed.   History:  Danielle Calderon is a 58 y.o. G2P1021 female being evaluated today for postmenopausal vaginal bleeding. She denies any abnormal vaginal discharge, bleeding, pelvic pain or other concerns.       Past Medical History:  Diagnosis Date  . Depression   . History of migraine headaches   . Hyperlipidemia   . Hypertension    History reviewed. No pertinent surgical history. The following portions of the patient's history were reviewed and updated as appropriate: allergies, current medications, past family history, past medical history, past social history, past surgical history and problem list.   Health Maintenance:  Normal pap and negative HRHPV on 05-31-2019.  Normal mammogram on 05-12-2019.   Review of Systems:  Pertinent items noted in HPI and remainder of comprehensive ROS otherwise negative.  Physical Exam:   General:  Alert, oriented and cooperative.   Mental Status: Normal mood and affect perceived. Normal judgment and thought content.  Physical exam deferred due to nature of the encounter  Labs and Imaging Results for orders placed or performed in visit on 06/16/19 (from the past 336 hour(s))  HM COLONOSCOPY   Collection Time: 06/16/19 12:00 AM  Result Value Ref Range   HM Colonoscopy See Report (in chart) See Report (in chart), Patient Reported  Results for orders placed or performed in visit on 06/15/19 (from the past 336  hour(s))  Hemoglobin A1c   Collection Time: 06/15/19  4:48 PM  Result Value Ref Range   Hgb A1c MFr Bld 6.5 (H) 4.8 - 5.6 %   Est. average glucose Bld gHb Est-mCnc 140 mg/dL  CMP14+EGFR   Collection Time: 06/15/19  4:48 PM  Result Value Ref Range   Glucose 94 65 - 99 mg/dL   BUN 9 6 - 24 mg/dL   Creatinine, Ser 0.83 0.57 - 1.00 mg/dL   GFR calc non Af Amer 77 >59 mL/min/1.73   GFR calc Af Amer 89 >59 mL/min/1.73   BUN/Creatinine Ratio 11 9 - 23   Sodium 142 134 - 144 mmol/L   Potassium 3.6 3.5 - 5.2 mmol/L   Chloride 101 96 - 106 mmol/L   CO2 25 20 - 29 mmol/L   Calcium 9.1 8.7 - 10.2 mg/dL   Total Protein 7.3 6.0 - 8.5 g/dL   Albumin 4.0 3.8 - 4.9 g/dL   Globulin, Total 3.3 1.5 - 4.5 g/dL   Albumin/Globulin Ratio 1.2 1.2 - 2.2   Bilirubin Total 0.6 0.0 - 1.2 mg/dL   Alkaline Phosphatase 63 39 - 117 IU/L   AST 12 0 - 40 IU/L   ALT 15 0 - 32 IU/L  Hepatitis C antibody   Collection Time: 06/15/19  4:48 PM  Result Value Ref Range   Hep C Virus Ab 0.2 0.0 - 0.9 s/co ratio  Lipid Profile   Collection Time: 06/15/19  4:48 PM  Result Value Ref Range   Cholesterol, Total 185 100 - 199 mg/dL   Triglycerides 89 0 - 149  mg/dL   HDL 70 >39 mg/dL   VLDL Cholesterol Cal 16 5 - 40 mg/dL   LDL Chol Calc (NIH) 99 0 - 99 mg/dL   Chol/HDL Ratio 2.6 0.0 - 4.4 ratio   US PELVIC COMPLETE WITH TRANSVAGINAL  Result Date: 06/07/2019 CLINICAL DATA:  Postmenopausal bleeding, history of fibroids EXAM: TRANSABDOMINAL AND TRANSVAGINAL ULTRASOUND OF PELVIS TECHNIQUE: Both transabdominal and transvaginal ultrasound examinations of the pelvis were performed. Transabdominal technique was performed for global imaging of the pelvis including uterus, ovaries, adnexal regions, and pelvic cul-de-sac. It was necessary to proceed with endovaginal exam following the transabdominal exam to visualize the endometrium and ovaries. COMPARISON:  12/16/2017 FINDINGS: Uterus Measurements: 10.0 x 4.6 x 5.2 cm = volume: 124  mL. Retroverted. Heterogeneous myometrium. Subserosal leiomyoma at anterior uterine fundus, 2.2 x 1.6 x 2.4 cm, partially calcified. No additional uterine masses Endometrium Thickness: 5 mm. Mildly heterogeneous. No endometrial fluid or focal mass. Right ovary Not visualized on either transabdominal or endovaginal imaging, question atrophic versus obscured by bowel Left ovary Not visualized on either transabdominal or endovaginal imaging, question atrophic versus obscured by bowel Other findings No free pelvic fluid.  No adnexal masses. IMPRESSION: Nonvisualization of ovaries. Small subserosal uterine leiomyoma 2.4 cm diameter at fundus. 5 mm thick endometrial complex, abnormal for postmenopausal patient. In the setting of post-menopausal bleeding, endometrial sampling is indicated to exclude carcinoma. If results are benign, sonohysterogram should be considered for focal lesion work-up. (Ref: Radiological Reasoning: Algorithmic Workup of Abnormal Vaginal Bleeding with Endovaginal Sonography and Sonohysterography. AJR 2008; 225:J50-51) Electronically Signed   By: Lavonia Dana M.D.   On: 06/07/2019 12:30      Assessment and Plan:     1. History of postmenopausal bleeding  2. Class 3 severe obesity due to excess calories without serious comorbidity with body mass index (BMI) of 40.0 to 44.9 in adult [facility])        I discussed the assessment and treatment plan with the patient. The patient was provided an opportunity to ask questions and all were answered. The patient agreed with the plan and demonstrated an understanding of the instructions.   The patient was advised to call back or seek an in-person evaluation/go to the ED if the symptoms worsen or if the condition fails to improve as anticipated.  I provided 10 minutes of non-face-to-face time during this encounter.   Baltazar Najjar, MD Center for [facility], [facility] 06/21/2019 8:59 AM

## 2019-06-23 ENCOUNTER — Telehealth (INDEPENDENT_AMBULATORY_CARE_PROVIDER_SITE_OTHER): Payer: 59 | Admitting: Obstetrics

## 2019-06-23 ENCOUNTER — Encounter: Payer: Self-pay | Admitting: Obstetrics

## 2019-06-23 DIAGNOSIS — Z6841 Body Mass Index (BMI) 40.0 and over, adult: Secondary | ICD-10-CM

## 2019-06-23 DIAGNOSIS — Z8742 Personal history of other diseases of the female genital tract: Secondary | ICD-10-CM | POA: Diagnosis not present

## 2019-06-23 NOTE — Progress Notes (Signed)
I connected with  Danielle Calderon on 06/23/19 by a video enabled telemedicine application and verified that I am speaking with the correct person using two identifiers.   I discussed the limitations of evaluation and management by telemedicine. The patient expressed understanding and agreed to proceed.  [facility] FU for ultrasound results. Reports no problems today.

## 2019-06-23 NOTE — Progress Notes (Signed)
TELEHEALTH VIRTUAL GYNECOLOGY VISIT ENCOUNTER NOTE  I connected with Danielle Calderon on 06/23/19 at  8:45 AM EST by telephone at home and verified that I am speaking with the correct person using two identifiers.   I discussed the limitations, risks, security and privacy concerns of performing an evaluation and management service by telephone and the availability of in person appointments. I also discussed with the patient that there may be a patient responsible charge related to this service. The patient expressed understanding and agreed to proceed.   History:  Danielle Calderon is a 58 y.o. G93P1021 female being evaluated today for postmenopausal vaginal bleeding.  Ultrasound done.  Presents today for discussion of ultrasound results and management recommendations.  She denies any abnormal vaginal discharge, bleeding, pelvic pain or other concerns.       Past Medical History:  Diagnosis Date  . Depression   . History of migraine headaches   . Hyperlipidemia   . Hypertension    History reviewed. No pertinent surgical history. The following portions of the patient's history were reviewed and updated as appropriate: allergies, current medications, past family history, past medical history, past social history, past surgical history and problem list.    Review of Systems:  Pertinent items noted in HPI and remainder of comprehensive ROS otherwise negative.  Physical Exam:   General:  Alert, oriented and cooperative.   Mental Status: Normal mood and affect perceived. Normal judgment and thought content.  Physical exam deferred due to nature of the encounter  Labs and Imaging Results for orders placed or performed in visit on 06/16/19 (from the past 336 hour(s))  HM COLONOSCOPY   Collection Time: 06/16/19 12:00 AM  Result Value Ref Range   HM Colonoscopy See Report (in chart) See Report (in chart), Patient Reported  Results for orders placed or performed in visit on 06/15/19 (from the past 336  hour(s))  Hemoglobin A1c   Collection Time: 06/15/19  4:48 PM  Result Value Ref Range   Hgb A1c MFr Bld 6.5 (H) 4.8 - 5.6 %   Est. average glucose Bld gHb Est-mCnc 140 mg/dL  CMP14+EGFR   Collection Time: 06/15/19  4:48 PM  Result Value Ref Range   Glucose 94 65 - 99 mg/dL   BUN 9 6 - 24 mg/dL   Creatinine, Ser 0.83 0.57 - 1.00 mg/dL   GFR calc non Af Amer 77 >59 mL/min/1.73   GFR calc Af Amer 89 >59 mL/min/1.73   BUN/Creatinine Ratio 11 9 - 23   Sodium 142 134 - 144 mmol/L   Potassium 3.6 3.5 - 5.2 mmol/L   Chloride 101 96 - 106 mmol/L   CO2 25 20 - 29 mmol/L   Calcium 9.1 8.7 - 10.2 mg/dL   Total Protein 7.3 6.0 - 8.5 g/dL   Albumin 4.0 3.8 - 4.9 g/dL   Globulin, Total 3.3 1.5 - 4.5 g/dL   Albumin/Globulin Ratio 1.2 1.2 - 2.2   Bilirubin Total 0.6 0.0 - 1.2 mg/dL   Alkaline Phosphatase 63 39 - 117 IU/L   AST 12 0 - 40 IU/L   ALT 15 0 - 32 IU/L  Hepatitis C antibody   Collection Time: 06/15/19  4:48 PM  Result Value Ref Range   Hep C Virus Ab 0.2 0.0 - 0.9 s/co ratio  Lipid Profile   Collection Time: 06/15/19  4:48 PM  Result Value Ref Range   Cholesterol, Total 185 100 - 199 mg/dL   Triglycerides 89 0 - 149 mg/dL  HDL 70 >39 mg/dL   VLDL Cholesterol Cal 16 5 - 40 mg/dL   LDL Chol Calc (NIH) 99 0 - 99 mg/dL   Chol/HDL Ratio 2.6 0.0 - 4.4 ratio   US PELVIC COMPLETE WITH TRANSVAGINAL  Result Date: 06/07/2019 CLINICAL DATA:  Postmenopausal bleeding, history of fibroids EXAM: TRANSABDOMINAL AND TRANSVAGINAL ULTRASOUND OF PELVIS TECHNIQUE: Both transabdominal and transvaginal ultrasound examinations of the pelvis were performed. Transabdominal technique was performed for global imaging of the pelvis including uterus, ovaries, adnexal regions, and pelvic cul-de-sac. It was necessary to proceed with endovaginal exam following the transabdominal exam to visualize the endometrium and ovaries. COMPARISON:  12/16/2017 FINDINGS: Uterus Measurements: 10.0 x 4.6 x 5.2 cm = volume: 124  mL. Retroverted. Heterogeneous myometrium. Subserosal leiomyoma at anterior uterine fundus, 2.2 x 1.6 x 2.4 cm, partially calcified. No additional uterine masses Endometrium Thickness: 5 mm. Mildly heterogeneous. No endometrial fluid or focal mass. Right ovary Not visualized on either transabdominal or endovaginal imaging, question atrophic versus obscured by bowel Left ovary Not visualized on either transabdominal or endovaginal imaging, question atrophic versus obscured by bowel Other findings No free pelvic fluid.  No adnexal masses. IMPRESSION: Nonvisualization of ovaries. Small subserosal uterine leiomyoma 2.4 cm diameter at fundus. 5 mm thick endometrial complex, abnormal for postmenopausal patient. In the setting of post-menopausal bleeding, endometrial sampling is indicated to exclude carcinoma. If results are benign, sonohysterogram should be considered for focal lesion work-up. (Ref: Radiological Reasoning: Algorithmic Workup of Abnormal Vaginal Bleeding with Endovaginal Sonography and Sonohysterography. AJR 2008; 735:A70-14) Electronically Signed   By: Lavonia Dana M.D.   On: 06/07/2019 12:30      Assessment and Plan:     1. History of postmenopausal bleeding - ultrasound WNL's with Endometrial Thickness of 5 mm. - follow up in 4 weeks for Endometrial Biopsy  2. Class 3 severe obesity due to excess calories without serious comorbidity with body mass index (BMI) of 40.0 to 44.9 in adult [facility]) - program of caloric restriction, exercise and behavioral modification recommended       I discussed the assessment and treatment plan with the patient. The patient was provided an opportunity to ask questions and all were answered. The patient agreed with the plan and demonstrated an understanding of the instructions.   The patient was advised to call back or seek an in-person evaluation/go to the ED if the symptoms worsen or if the condition fails to improve as anticipated.  I provided 15 minutes of  non-face-to-face time during this encounter.   Baltazar Najjar, MD Center for [place], [place] Shelly Bombard, MD 06/23/2019 8:56 AM

## 2019-07-05 DIAGNOSIS — R21 Rash and other nonspecific skin eruption: Secondary | ICD-10-CM | POA: Diagnosis not present

## 2019-07-26 ENCOUNTER — Other Ambulatory Visit (HOSPITAL_COMMUNITY)
Admission: RE | Admit: 2019-07-26 | Discharge: 2019-07-26 | Disposition: A | Discharge status: Home / Self Care | Payer: 59 | Source: Ambulatory Visit | Attending: Obstetrics | Admitting: Obstetrics

## 2019-07-26 ENCOUNTER — Ambulatory Visit (INDEPENDENT_AMBULATORY_CARE_PROVIDER_SITE_OTHER): Payer: 59 | Admitting: Obstetrics

## 2019-07-26 ENCOUNTER — Encounter: Payer: Self-pay | Admitting: Obstetrics

## 2019-07-26 ENCOUNTER — Other Ambulatory Visit: Payer: Self-pay

## 2019-07-26 VITALS — BP 142/87 | HR 66 | Wt 218.0 lb

## 2019-07-26 DIAGNOSIS — N950 Postmenopausal bleeding: Secondary | ICD-10-CM | POA: Diagnosis not present

## 2019-07-26 DIAGNOSIS — Z8742 Personal history of other diseases of the female genital tract: Secondary | ICD-10-CM

## 2019-07-26 NOTE — Progress Notes (Signed)
Endometrial Biopsy Procedure Note  Pre-operative Diagnosis: Postmenopausal Bleeding  Post-operative Diagnosis: same  Indications: postmenopausal bleeding  Procedure Details   Urine pregnancy test was not done.  The risks (including infection, bleeding, pain, and uterine perforation) and benefits of the procedure were explained to the patient and Written informed consent was obtained.    The patient was placed in the dorsal lithotomy position.  Bimanual exam showed the uterus to be in the retroflexed position.  A Graves' speculum inserted in the vagina, and the cervix prepped with povidone iodine.  Endocervical curettage with a Kevorkian curette was not performed.   A sharp tenaculum was applied to the anterior lip of the cervix for stabilization.  A sterile uterine sound was used to sound the uterus to a depth of 6cm.  A Pipelle endometrial aspirator was used to sample the endometrium.  Sample was sent for pathologic examination.  Condition: Stable  Complications: None  Plan:  The patient was advised to call for any fever or for prolonged or severe pain or bleeding. She was advised to use NSAID as needed for mild to moderate pain. She was advised to avoid vaginal intercourse for 48 hours or until the bleeding has completely stopped.  Attending Physician Documentation: I was present for or participated in the entire procedure, including opening and closing.   Shelly Bombard, MD 07/26/2019 10:46 AM

## 2019-07-26 NOTE — Addendum Note (Signed)
Addended by: Delrae Alfred on: 07/26/2019 04:23 PM   Modules accepted: Orders

## 2019-07-26 NOTE — Addendum Note (Signed)
Addended by: Delrae Alfred on: 07/26/2019 11:41 AM   Modules accepted: Orders

## 2019-07-28 LAB — SURGICAL PATHOLOGY

## 2019-08-09 ENCOUNTER — Telehealth (INDEPENDENT_AMBULATORY_CARE_PROVIDER_SITE_OTHER): Payer: 59 | Admitting: Obstetrics

## 2019-08-09 ENCOUNTER — Encounter: Payer: Self-pay | Admitting: Obstetrics

## 2019-08-09 DIAGNOSIS — R9389 Abnormal findings on diagnostic imaging of other specified body structures: Secondary | ICD-10-CM

## 2019-08-09 DIAGNOSIS — Z6841 Body Mass Index (BMI) 40.0 and over, adult: Secondary | ICD-10-CM | POA: Diagnosis not present

## 2019-08-09 DIAGNOSIS — Z8742 Personal history of other diseases of the female genital tract: Secondary | ICD-10-CM

## 2019-08-09 MED ORDER — MEGESTROL ACETATE 20 MG PO TABS: 20.0000 mg | ORAL_TABLET | Freq: Every day | ORAL | 2 refills | Status: DC

## 2019-08-09 NOTE — Progress Notes (Signed)
GYN Virtual Visit.

## 2019-08-09 NOTE — Progress Notes (Signed)
TELEHEALTH GYNECOLOGY VIRTUAL VIDEO VISIT ENCOUNTER NOTE  Provider location: Center for [employer] [facility] at [place]   I connected with Vaughan Sine on 08/09/19 at 11:15 AM EST by Gyn MyChart Video Encounter at home and verified that I am speaking with the correct person using two identifiers.   I discussed the limitations, risks, security and privacy concerns of performing an evaluation and management service virtually and the availability of in person appointments. I also discussed with the patient that there may be a patient responsible charge related to this service. The patient expressed understanding and agreed to proceed.   History:  Danielle Calderon is a 59 y.o. G68P1021 female being evaluated today for history of postmenopausal bleeding and a thickened endometrium on ultrasound. She denies any abnormal vaginal discharge, bleeding, pelvic pain or other concerns.       Past Medical History:  Diagnosis Date  . Depression   . History of migraine headaches   . Hyperlipidemia   . Hypertension    History reviewed. No pertinent surgical history. The following portions of the patient's history were reviewed and updated as appropriate: allergies, current medications, past family history, past medical history, past social history, past surgical history and problem list.   Health Maintenance:  Normal pap and negative HRHPV on 07-26-2019.  Normal mammogram on 05-12-2019.   Review of Systems:  Pertinent items noted in HPI and remainder of comprehensive ROS otherwise negative.  Physical Exam:   General:  Alert, oriented and cooperative. Patient appears to be in no acute distress.  Mental Status: Normal mood and affect. Normal behavior. Normal judgment and thought content.   Respiratory: Normal respiratory effort, no problems with respiration noted  Rest of physical exam deferred due to type of encounter  Labs and Imaging Results for orders placed or performed in visit on 07/26/19 (from  the past 336 hour(s))  Surgical pathology( [place])   Collection Time: 07/26/19  4:23 PM  Result Value Ref Range   SURGICAL PATHOLOGY      SURGICAL PATHOLOGY CASE: [id number] PATIENT: Vaughan Sine Surgical Pathology Report     Clinical History: History of PMB (cm)     FINAL MICROSCOPIC DIAGNOSIS:  A. ENDOMETRIUM, BIOPSY: - Scant atrophic-appearing endometrium in the background of endocervical glandular and squamous epithelium (benign).   GROSS DESCRIPTION:  Received in formalin is blood tinged mucus that is entirely submitted in one block. Volume: 1.2 x 1 x 0.3 cm (1 B) SW 07/27/2019    Final Diagnosis performed by Gillie Manners, MD.   Electronically signed 07/28/2019 Technical and / or Professional components performed at [facility]. [facility], [place] [address], [place], [place] [address].  Immunohistochemistry Technical component (if applicable) was performed at [facility] [address], [place], [place], [place] [address].   IMMUNOHISTOCHEMISTRY DISCLAIMER (if applicable): Some of these immunohistochemical stains may have been developed and the performa nce characteristics determine by [facility]. Some may not have been cleared or approved by the U.S. Food and Drug Administration. The FDA has determined that such clearance or approval is not necessary. This test is used for clinical purposes. It should not be regarded as investigational or for research. This laboratory is certified under the [place] (CLIA-88) as qualified to perform high complexity clinical laboratory testing.  The controls stained appropriately.    No results found.     Assessment and Plan:     1. History of postmenopausal bleeding -  resolved  2. Thickened endometrium Rx: - megestrol (MEGACE) 20 MG tablet; Take 1 tablet (20 mg total) by mouth daily.  Dispense: 60 tablet; Refill: 2 -  US PELVIC COMPLETE WITH TRANSVAGINAL; Future  3. Class 3 severe obesity due to excess calories without serious comorbidity with body mass index (BMI) of 40.0 to 44.9 in adult [facility]) - program of caloric restriction, exercise and behavioral modification recommended       I discussed the assessment and treatment plan with the patient. The patient was provided an opportunity to ask questions and all were answered. The patient agreed with the plan and demonstrated an understanding of the instructions.   The patient was advised to call back or seek an in-person evaluation/go to the ED if the symptoms worsen or if the condition fails to improve as anticipated.  I provided 15 minutes of face-to-face time during this encounter.   Baltazar Najjar, MD Center for [facility], [facility] 08/09/2019

## 2019-08-12 ENCOUNTER — Encounter: Payer: Self-pay | Admitting: Nurse Practitioner

## 2019-09-12 ENCOUNTER — Other Ambulatory Visit: Payer: Self-pay | Admitting: Nurse Practitioner

## 2019-09-12 DIAGNOSIS — M8580 Other specified disorders of bone density and structure, unspecified site: Secondary | ICD-10-CM

## 2019-09-14 ENCOUNTER — Ambulatory Visit: Payer: 59 | Admitting: Nurse Practitioner

## 2019-09-16 ENCOUNTER — Other Ambulatory Visit: Payer: Self-pay | Admitting: Nurse Practitioner

## 2019-09-29 ENCOUNTER — Telehealth: Payer: Self-pay

## 2019-09-29 NOTE — Telephone Encounter (Signed)
Pt reports she has been taking her Megace as prescribed since February and has not missed any doses. Pt reports her bleeding had stopped until today she has been bleeding a small amount of bright red blood, no other symptoms. Pt would like to know what Dr. Jodi Mourning recommends.

## 2019-10-15 DIAGNOSIS — H2513 Age-related nuclear cataract, bilateral: Secondary | ICD-10-CM | POA: Diagnosis not present

## 2019-10-15 DIAGNOSIS — H2511 Age-related nuclear cataract, right eye: Secondary | ICD-10-CM | POA: Diagnosis not present

## 2019-10-15 DIAGNOSIS — H538 Other visual disturbances: Secondary | ICD-10-CM | POA: Diagnosis not present

## 2019-11-02 ENCOUNTER — Other Ambulatory Visit: Payer: Self-pay

## 2019-11-02 NOTE — Patient Outreach (Signed)
[facility] [facility]) Care Management  11/02/2019  Danielle Calderon [birth year] [id number]   New referral: Source of referral: [facility] Hypertension initiative  Placed call to patient and explained reason for call. Reviewed initiative for hypertension. 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 Reports she does not follow a diet and needs to lose weight.  Patient reports she is interested.  Reviewed with patient that I would mail her a BP cuff and I want her to monitor her BP twice a week and record. We also discussed diet.    Plan: will mail new patient packet, BP cuff, Low salt diet information and then call back in 2.5 weeks to complete assessments.Patient is agreeable to plan and confirmed address.   Tomasa Rand, RN, BSN, CEN [facility] [employer] [phone]

## 2019-11-08 ENCOUNTER — Ambulatory Visit
Admission: RE | Admit: 2019-11-08 | Discharge: 2019-11-08 | Disposition: A | Discharge status: Home / Self Care | Payer: 59 | Source: Ambulatory Visit | Attending: Obstetrics | Admitting: Obstetrics

## 2019-11-08 DIAGNOSIS — D252 Subserosal leiomyoma of uterus: Secondary | ICD-10-CM | POA: Diagnosis not present

## 2019-11-08 DIAGNOSIS — R9389 Abnormal findings on diagnostic imaging of other specified body structures: Secondary | ICD-10-CM

## 2019-11-16 ENCOUNTER — Other Ambulatory Visit: Payer: Self-pay

## 2019-11-16 NOTE — Patient Outreach (Signed)
[name]) Care Management  11/16/2019  Danielle Calderon [birth year] [id number]   2 week follow up assessment:  Placed call to patient who states she has her BP cuff but has not taken it out of the box.  Encouraged patient to set up monitor and check her BP 2 times this week. Rescheduled telephone assessment for May 24 to complete assessments and reviewed readings.  PLAN: follow up call on 11/22/2019  Tomasa Rand, RN, BSN, [place] Coordinator [phone]

## 2019-11-22 ENCOUNTER — Ambulatory Visit: Payer: Self-pay

## 2019-11-23 ENCOUNTER — Other Ambulatory Visit: Payer: Self-pay

## 2019-11-23 NOTE — Patient Outreach (Signed)
[facility]) Care Management   11/23/2019  Danielle Calderon Nov 24, 1960 [id number]  Danielle Calderon is an 59 y.o. female Telephone assessment Subjective: Patient reports she has received her BP cuff and has been self monitoring. Reports she has switched from chips to apples.  Reports she takes her medication as prescribed.  Reports she wants to lose weight ( about 30 pounds)   Objective:   Today's Vitals   11/23/19 1125 11/23/19 1126  Weight: 219 lb (99.3 kg)   Height: 1.626 m (5' 4" )   PainSc:  0-No pain   Review of Systems  Physical Exam  Encounter Medications:   Outpatient Encounter Medications as of 11/23/2019  Medication Sig  . alendronate (FOSAMAX) 70 MG tablet TAKE 1 EVERY 7 DAYS. TAKE WITH A FULL GLASS OF WATER ON AN EMPTY STOMACH.  . calcium carbonate 1250 MG capsule Take 1,250 mg by mouth daily.  . cholecalciferol (VITAMIN D) 1000 UNITS tablet Take 3,000 Units by mouth daily.  . citalopram (CELEXA) 40 MG tablet Take 1 tablet (40 mg total) by mouth daily.  . diphenhydrAMINE (SOMINEX) 25 MG tablet Take 25 mg by mouth at bedtime as needed for sleep.  Marland Kitchen gabapentin (NEURONTIN) 300 MG capsule TAKE 1 CAPSULE BY MOUTH TWICE A DAY  . megestrol (MEGACE) 20 MG tablet Take 1 tablet (20 mg total) by mouth daily.  . rosuvastatin (CRESTOR) 5 MG tablet TAKE 1 TABLET BY MOUTH EVERY DAY  . telmisartan (MICARDIS) 40 MG tablet Take 1 tablet (40 mg total) by mouth daily.   No facility-administered encounter medications on file as of 11/23/2019.    Functional Status:   In your present state of health, do you have any difficulty performing the following activities: 11/23/2019 06/15/2019  Hearing? N N  Vision? N N  Difficulty concentrating or making decisions? N N  Walking or climbing stairs? N N  Dressing or bathing? N N  Doing errands, shopping? N N  Preparing Food and eating ? N -  Using the Toilet? N -  In the past six months, have you accidently leaked urine? N -  Do you  have problems with loss of bowel control? N -  Managing your Medications? N -  Managing your Finances? N -  Housekeeping or managing your Housekeeping? N -  Some recent data might be hidden    Fall/Depression Screening:    Fall Risk  11/23/2019 06/15/2019 01/19/2019  Falls in the past year? 0 0 0   PHQ 2/9 Scores 11/23/2019 06/15/2019 01/19/2019  PHQ - 2 Score 0 0 0  PHQ- 9 Score - 0 0    Assessment:   (1)Assessed BP readings, BP cuff is working without difficulty (2)interested in weight loss and exercise (3) working on following low salt diet (4) interested in advanced directives. Plan:  (1) Reviewed importance of monitoring BP at different times of the day. Reviewed where to record on [facility] calendar. Will reassess at next telephone outreach. Reviewed with patient signs and symptoms of heart attack and stroke and the importance of calling 911. (2) reviewed benefits of exercises for BP control and overall healthy. Encouraged patient to exercise 15 minutes 3 times per week.  Reviewed her option of going to a gym paid by her employer.  (3) assessed and no questions about low salt diet. Confirmed she received her low salt diet poster.  (4) Will mail advanced directive packet. Will follow up in 1 month via phone. Encouraged patient to call me sooner if needed.  THN CM Care Plan Problem One     Most Recent Value  Care Plan Problem One  Diagnosis of hypertension with recent BP reading of 130/80  Role Documenting the Problem One  Care Management [place] for Problem One  Active  THN Long Term Goal   Patient will verbalize mointoring BP twice a week for the next 90 days.   THN Long Term Goal Start Date  11/02/19  Interventions for Problem One Long Term Goal  Reviewed current readings. Reviewed normal BP readings. Reviewed THN calendar and tab to record BP and weghts. Encouraged patient to use calendar.   THN CM Short Term Goal #1   Patient will verbalize receiving her BP monitor  in the next 14 days.   THN CM Short Term Goal #1 Start Date  11/02/19  THN CM Short Term Goal #1 Met Date  11/23/19  THN CM Short Term Goal #2   Patient will report understanding importance of low salt diet for BP control in the next 30 days.   THN CM Short Term Goal #2 Start Date  11/02/19  Interventions for Short Term Goal #2  Reviewed with patient that she has her BP cuff and low salt diet information.   THN CM Short Term Goal #3  Patient will report exercising for 15 minutes 3 times per week for the next 30 days.  THN CM Short Term Goal #3 Start Date  11/23/19  Interventions for Short Tern Goal #3  Reviewed benefits of exercising on weight and BP.  Reviewed options to walk when it is cool outside or with a buddy. Patient is considering joining the gym.       Tomasa Rand, RN, BSN, CEN [facility] [employer] [phone]

## 2019-12-02 ENCOUNTER — Other Ambulatory Visit: Payer: Self-pay | Admitting: Nurse Practitioner

## 2019-12-02 DIAGNOSIS — Z780 Asymptomatic menopausal state: Secondary | ICD-10-CM

## 2019-12-08 ENCOUNTER — Other Ambulatory Visit: Payer: Self-pay | Admitting: Nurse Practitioner

## 2019-12-08 DIAGNOSIS — G629 Polyneuropathy, unspecified: Secondary | ICD-10-CM

## 2019-12-14 ENCOUNTER — Other Ambulatory Visit: Payer: Self-pay | Admitting: Nurse Practitioner

## 2019-12-14 DIAGNOSIS — F32A Depression, unspecified: Secondary | ICD-10-CM

## 2019-12-15 ENCOUNTER — Encounter: Payer: 59 | Admitting: Nurse Practitioner

## 2019-12-21 ENCOUNTER — Other Ambulatory Visit: Payer: Self-pay

## 2019-12-21 NOTE — Patient Outreach (Signed)
[facility]) Care Management  12/21/2019  Danielle Calderon 1961-06-13 [id number]   Telephone assessment:  Placed 1 month follow up call to patient. No answer. Left a message requesting a call back.  Tomasa Rand, RN, BSN, CEN [facility] [employer] [phone]

## 2019-12-24 ENCOUNTER — Other Ambulatory Visit: Payer: Self-pay

## 2019-12-24 NOTE — Patient Outreach (Signed)
[facility]) Care Management  12/24/2019  Danielle Calderon 12/21/1960 [id number]   Telephone assessment:  Placed call to patient for monthly follow up. No answer. Left a message requesting a call back.  PLAN:  If no response will call back in 3 days.  Tomasa Rand, RN, BSN, CEN [facility] [employer] [phone]

## 2019-12-29 ENCOUNTER — Encounter: Payer: 59 | Admitting: Nurse Practitioner

## 2019-12-29 ENCOUNTER — Other Ambulatory Visit: Payer: Self-pay

## 2019-12-29 NOTE — Patient Outreach (Signed)
[facility]) Care Management  12/29/2019  Danielle Calderon Nov 23, 1960 [id number]    Telephone assessment:   1057:  Place call to patient with no answer. Left a message requesting a call back.  1100  Return call from patient who states that she has not been monitoring BP. Reports only exercise is walking her dog.  Denies any new problems or concerns.    PLAN: reviewed importance of monitoring and recording of BP. Patient reports she will do better. Will plan follow up call in 2 weeks.  Tomasa Rand, RN, BSN, CEN [facility] [employer] [phone]

## 2020-01-06 ENCOUNTER — Other Ambulatory Visit: Payer: Self-pay | Admitting: Nurse Practitioner

## 2020-01-06 ENCOUNTER — Other Ambulatory Visit: Payer: Self-pay | Admitting: Obstetrics

## 2020-01-06 DIAGNOSIS — R9389 Abnormal findings on diagnostic imaging of other specified body structures: Secondary | ICD-10-CM

## 2020-01-06 DIAGNOSIS — G629 Polyneuropathy, unspecified: Secondary | ICD-10-CM

## 2020-01-10 ENCOUNTER — Ambulatory Visit: Payer: 59

## 2020-01-12 ENCOUNTER — Other Ambulatory Visit: Payer: Self-pay

## 2020-01-13 NOTE — Patient Outreach (Signed)
[facility]) Care Management  01/13/2020  Danielle Calderon [birth year] [id number]   Telephone assessment:  Placed call to patient with no answer.   PLAN: Will continue outreach efforts in 3 days.   Tomasa Rand, RN, BSN, CEN [facility] [employer] [phone]

## 2020-01-17 ENCOUNTER — Other Ambulatory Visit: Payer: Self-pay

## 2020-01-17 NOTE — Patient Outreach (Signed)
[facility]) Care Management  01/17/2020  Danielle Calderon 1961-02-05 [id number]   Telephone assessment:    2nd unsuccessful outreach to patient.  Placed call and someone immediately hung up.  PLAN: will attempt again in 3 days if no return call.  Tomasa Rand, RN, BSN, CEN [facility] [employer] [phone]

## 2020-01-20 ENCOUNTER — Other Ambulatory Visit: Payer: Self-pay

## 2020-01-20 NOTE — Patient Outreach (Signed)
[facility]) Care Management  01/20/2020  Dazhane Villagomez Jan 28, 1961 [id number]   Telephone assessment:  Placed call to patient with no answer. Voicemail box is full.  PLAN: will mail outreach letter and attempt again.  Tomasa Rand, RN, BSN, CEN [facility] [employer] [phone]

## 2020-01-27 ENCOUNTER — Other Ambulatory Visit: Payer: Self-pay

## 2020-01-27 NOTE — Patient Outreach (Signed)
[facility] [facility]) Care Management  01/27/2020  Graziella Connery 1960/10/26 [id number]   Telephone assessment:  Hypertension initiative  Placed call to patient with no answer.  This is the 4th attempt.  PLAN: will case close in 1 week if no response to letter or telephone outreaches.  Tomasa Rand, RN, BSN, CEN [facility] [employer] [phone]

## 2020-02-03 ENCOUNTER — Other Ambulatory Visit: Payer: Self-pay

## 2020-02-03 ENCOUNTER — Ambulatory Visit (INDEPENDENT_AMBULATORY_CARE_PROVIDER_SITE_OTHER): Payer: 59 | Admitting: Nurse Practitioner

## 2020-02-03 ENCOUNTER — Encounter: Payer: Self-pay | Admitting: Nurse Practitioner

## 2020-02-03 VITALS — BP 112/80 | HR 71 | Temp 98.2°F | Ht 63.6 in | Wt 224.8 lb

## 2020-02-03 DIAGNOSIS — R7309 Other abnormal glucose: Secondary | ICD-10-CM

## 2020-02-03 DIAGNOSIS — Z0000 Encounter for general adult medical examination without abnormal findings: Secondary | ICD-10-CM | POA: Diagnosis not present

## 2020-02-03 DIAGNOSIS — M8580 Other specified disorders of bone density and structure, unspecified site: Secondary | ICD-10-CM | POA: Diagnosis not present

## 2020-02-03 DIAGNOSIS — Z114 Encounter for screening for human immunodeficiency virus [HIV]: Secondary | ICD-10-CM | POA: Diagnosis not present

## 2020-02-03 DIAGNOSIS — I10 Essential (primary) hypertension: Secondary | ICD-10-CM

## 2020-02-03 DIAGNOSIS — Z23 Encounter for immunization: Secondary | ICD-10-CM | POA: Diagnosis not present

## 2020-02-03 DIAGNOSIS — R69 Illness, unspecified: Secondary | ICD-10-CM | POA: Diagnosis not present

## 2020-02-03 DIAGNOSIS — E785 Hyperlipidemia, unspecified: Secondary | ICD-10-CM | POA: Diagnosis not present

## 2020-02-03 DIAGNOSIS — Z6839 Body mass index (BMI) 39.0-39.9, adult: Secondary | ICD-10-CM

## 2020-02-03 LAB — POCT URINALYSIS DIPSTICK
Bilirubin, UA: NEGATIVE
Blood, UA: NEGATIVE
Glucose, UA: NEGATIVE
Ketones, UA: NEGATIVE
Nitrite, UA: NEGATIVE
Protein, UA: NEGATIVE
Spec Grav, UA: 1.015 (ref 1.010–1.025)
Urobilinogen, UA: 0.2 E.U./dL
pH, UA: 7 (ref 5.0–8.0)

## 2020-02-03 LAB — POCT UA - MICROALBUMIN
Albumin/Creatinine Ratio, Urine, POC: <30
Creatinine, POC: 100 mg/dL
Microalbumin Ur, POC: 10 mg/L

## 2020-02-03 MED ORDER — TETANUS-DIPHTH-ACELL PERTUSSIS 5-2.5-18.5 LF-MCG/0.5 IM SUSP
0.5000 mL | Freq: Once | INTRAMUSCULAR | Status: AC
  Administered 2020-02-03: 0.5 mL via INTRAMUSCULAR

## 2020-02-03 NOTE — Progress Notes (Signed)
This visit occurred during the SARS-CoV-2 public health emergency.  Safety protocols were in place, including screening questions prior to the visit, additional usage of staff PPE, and extensive cleaning of exam room while observing appropriate contact time as indicated for disinfecting solutions.  Subjective:     Patient ID: Danielle Calderon , female    DOB: Jul 24, 1960 , 58 y.o.   MRN: [id number]   Chief Complaint  Patient presents with  . Annual Exam    HPI  Here for HM  She has been to see the GYN after having breakthrough bleeding (Dr. Jodi Mourning), she has had 2 biopsies which were negative. She has not had any vaginal bleeding in the last week.  Blood pressure at home 130/80 average    Hypertension This is a chronic problem. The current episode started more than 1 year ago. The problem is unchanged. The problem is controlled. Pertinent negatives include no anxiety, headaches or shortness of breath. There are no associated agents to hypertension. Risk factors for coronary artery disease include obesity and sedentary lifestyle. Past treatments include angiotensin blockers. Compliance problems include exercise and diet.  There is no history of angina.     Past Medical History:  Diagnosis Date  . Depression   . History of migraine headaches   . Hyperlipidemia   . Hypertension      Family History  Problem Relation Age of Onset  . Hypertension Mother   . Hypertension Father   . Cancer Father   . Diabetes Father   . Breast cancer Maternal Aunt   . AAA (abdominal aortic aneurysm) Maternal Aunt      Current Outpatient Medications:  .  alendronate (FOSAMAX) 70 MG tablet, TAKE 1 TABLET BY MOUTH EVERY 7 DAYS. TAKE WITH A FULL GLASS OF WATER ON AN EMPTY STOMACH, Disp: 4 tablet, Rfl: 2 .  calcium carbonate 1250 MG capsule, Take 1,250 mg by mouth daily., Disp: , Rfl:  .  cholecalciferol (VITAMIN D) 1000 UNITS tablet, Take 3,000 Units by mouth daily., Disp: , Rfl:  .  citalopram (CELEXA)  40 MG tablet, TAKE 1 TABLET BY MOUTH EVERY DAY, Disp: 30 tablet, Rfl: 5 .  diphenhydrAMINE (SOMINEX) 25 MG tablet, Take 25 mg by mouth at bedtime as needed for sleep., Disp: , Rfl:  .  gabapentin (NEURONTIN) 300 MG capsule, TAKE 1 CAPSULE BY MOUTH TWICE A DAY, Disp: 60 capsule, Rfl: 0 .  omeprazole (PRILOSEC) 20 MG capsule, Take 20 mg by mouth daily., Disp: , Rfl:  .  rosuvastatin (CRESTOR) 5 MG tablet, TAKE 1 TABLET BY MOUTH EVERY DAY, Disp: 30 tablet, Rfl: 2 .  telmisartan (MICARDIS) 40 MG tablet, Take 1 tablet (40 mg total) by mouth daily., Disp: 90 tablet, Rfl: 1  Current Facility-Administered Medications:  .  Tdap (BOOSTRIX) injection 0.5 mL, 0.5 mL, Intramuscular, Once, [name], FNP   Allergies  Allergen Reactions  . Lisinopril Cough      The patient states she uses post menopausal status . Last menstrual cycle one week ago. Negative for Dysmenorrhea and Negative for Menorrhagia. Negative for: breast discharge, breast lump(s), breast pain and breast self exam. Associated symptoms include abnormal vaginal bleeding. Pertinent negatives include abnormal bleeding (hematology), anxiety, decreased libido, depression, difficulty falling sleep, dyspareunia, history of infertility, nocturia, sexual dysfunction, sleep disturbances, urinary incontinence, urinary urgency, vaginal discharge and vaginal itching. Diet regular. The patient states her exercise level is minimal - she does walk when she walks her dog.     The patient's tobacco  use is:  Social History   Tobacco Use  Smoking Status Former Smoker  Smokeless Tobacco Never Used   She has been exposed to passive smoke. The patient's alcohol use is:  Social History   Substance and Sexual Activity  Alcohol Use Yes  . Alcohol/week: 3.0 standard drinks  . Types: 3 Glasses of wine per week   Comment: wine 3 times per week.    Additional information: Last pap 05/31/2019, next one scheduled for 05/30/2022.    Review of Systems   Constitutional: Negative.   HENT: Negative.   Eyes: Negative.   Respiratory: Negative.  Negative for shortness of breath.   Cardiovascular: Negative.   Gastrointestinal: Negative.   Endocrine: Negative.   Genitourinary: Negative.   Musculoskeletal: Negative.   Skin: Negative.   Allergic/Immunologic: Negative.   Neurological: Negative.  Negative for dizziness and headaches.  Hematological: Negative.   Psychiatric/Behavioral: Negative.      Today's Vitals   02/03/20 1116  BP: 112/80  Pulse: 71  Temp: 98.2 F (36.8 C)  TempSrc: Oral  Weight: 224 lb 12.8 oz (102 kg)  Height: 5' 3.6" (1.615 m)  PainSc: 0-No pain   Body mass index is 39.07 kg/m.   Wt Readings from Last 3 Encounters:  02/03/20 224 lb 12.8 oz (102 kg)  11/23/19 219 lb (99.3 kg)  07/26/19 218 lb (98.9 kg)    Objective:  Physical Exam Constitutional:      General: She is not in acute distress.    Appearance: Normal appearance. She is well-developed. She is obese.  HENT:     Head: Normocephalic and atraumatic.     Right Ear: Hearing, tympanic membrane, ear canal and external ear normal. There is no impacted cerumen.     Left Ear: Hearing, tympanic membrane, ear canal and external ear normal. There is no impacted cerumen.     Nose:     Comments: Deferred - masked    Mouth/Throat:     Comments: Deferred - masked  Eyes:     General: Lids are normal.     Extraocular Movements: Extraocular movements intact.     Conjunctiva/sclera: Conjunctivae normal.     Pupils: Pupils are equal, round, and reactive to light.     Funduscopic exam:    Right eye: No papilledema.        Left eye: No papilledema.  Neck:     Thyroid: No thyroid mass.     Vascular: No carotid bruit.  Cardiovascular:     Rate and Rhythm: Normal rate and regular rhythm.     Pulses: Normal pulses.     Heart sounds: Normal heart sounds. No murmur heard.   Pulmonary:     Effort: Pulmonary effort is normal. No respiratory distress.      Breath sounds: Normal breath sounds. No wheezing.  Chest:     Breasts: Tanner Score is 5.        Right: Normal. No swelling, mass or tenderness.        Left: Normal. No swelling, mass or tenderness.  Abdominal:     General: Abdomen is flat. Bowel sounds are normal. There is no distension.     Palpations: Abdomen is soft.     Tenderness: There is no abdominal tenderness.  Genitourinary:    Rectum: Guaiac result negative.  Musculoskeletal:        General: No swelling. Normal range of motion.     Cervical back: Full passive range of motion without pain, normal range of motion  and neck supple.     Right lower leg: No edema.     Left lower leg: No edema.  Skin:    General: Skin is warm and dry.     Capillary Refill: Capillary refill takes less than 2 seconds.  Neurological:     General: No focal deficit present.     Mental Status: She is alert and oriented to person, place, and time.     Cranial Nerves: No cranial nerve deficit.     Sensory: No sensory deficit.  Psychiatric:        Mood and Affect: Mood normal.        Behavior: Behavior normal.        Thought Content: Thought content normal.        Judgment: Judgment normal.         Assessment And Plan:     1. Encounter for general adult medical examination w/o abnormal findings . Behavior modifications discussed and diet history reviewed.   . Pt will continue to exercise regularly and modify diet with low GI, plant based foods and decrease intake of processed foods.  . Recommend intake of daily multivitamin, Vitamin D, and calcium.  . Recommend mammogram and colonoscopy for preventive screenings, as well as recommend immunizations that include influenza (placing her on the flu list), TDAP (up to date)  - CMP14+EGFR - CBC  2. Encounter for HIV (human immunodeficiency virus) test - HIV Antibody (routine testing w rflx)  3. Class 2 severe obesity due to excess calories with serious comorbidity and body mass index (BMI) of 39.0  to 39.9 in adult [facility])  Chronic  Discussed healthy diet and regular exercise options   Encouraged to exercise at least 150 minutes per week with 2 days of strength training  4. Essential hypertension Comments: Chronic, good control EKG done NSR with HR 66 Continue to avoid high salt foods - EKG 12-Lead - POCT Urinalysis Dipstick (81002) - POCT UA - Microalbumin  5. Abnormal glucose  Chronic, stable  No current medications  Encouraged to limit intake of sugary foods and drinks  Encouraged to increase physical activity to 150 minutes per week - Hemoglobin A1c  6. Hyperlipidemia, unspecified hyperlipidemia type  Chronic, controlled  Continue with current medications, tolerating well - Lipid panel  7. Osteopenia after menopause  She is to take vitamin d and calcium daily  8. Encounter for immunization  Will give tetanus vaccine today while in office. Refer to order management. TDAP will be administered to adults 23-40 years old every 10 years. - Tdap (BOOSTRIX) injection 0.5 mL    Patient was given opportunity to ask questions. Patient verbalized understanding of the plan and was able to repeat key elements of the plan. All questions were answered to their satisfaction.    Teola Bradley, FNP, have reviewed all documentation for this visit. The documentation on 02/03/20 for the exam, diagnosis, procedures, and orders are all accurate and complete.   THE PATIENT IS ENCOURAGED TO PRACTICE SOCIAL DISTANCING DUE TO THE COVID-19 PANDEMIC.

## 2020-02-03 NOTE — Patient Instructions (Signed)
Health Maintenance, Female Adopting a healthy lifestyle and getting preventive care are important in promoting health and wellness. Ask your health care provider about:  The right schedule for you to have regular tests and exams.  Things you can do on your own to prevent diseases and keep yourself healthy. What should I know about diet, weight, and exercise? Eat a healthy diet   Eat a diet that includes plenty of vegetables, fruits, low-fat dairy products, and lean protein.  Do not eat a lot of foods that are high in solid fats, added sugars, or sodium. Maintain a healthy weight Body mass index (BMI) is used to identify weight problems. It estimates body fat based on height and weight. Your health care provider can help determine your BMI and help you achieve or maintain a healthy weight. Get regular exercise Get regular exercise. This is one of the most important things you can do for your health. Most adults should:  Exercise for at least 150 minutes each week. The exercise should increase your heart rate and make you sweat (moderate-intensity exercise).  Do strengthening exercises at least twice a week. This is in addition to the moderate-intensity exercise.  Spend less time sitting. Even light physical activity can be beneficial. Watch cholesterol and blood lipids Have your blood tested for lipids and cholesterol at 61 years of age, then have this test every 5 years. Have your cholesterol levels checked more often if:  Your lipid or cholesterol levels are high.  You are older than 61 years of age.  You are at high risk for heart disease. What should I know about cancer screening? Depending on your health history and family history, you may need to have cancer screening at various ages. This may include screening for:  Breast cancer.  Cervical cancer.  Colorectal cancer.  Skin cancer.  Lung cancer. What should I know about heart disease, diabetes, and high blood  pressure? Blood pressure and heart disease  High blood pressure causes heart disease and increases the risk of stroke. This is more likely to develop in people who have high blood pressure readings, are of African descent, or are overweight.  Have your blood pressure checked: ? Every 3-5 years if you are 18-39 years of age. ? Every year if you are 40 years old or older. Diabetes Have regular diabetes screenings. This checks your fasting blood sugar level. Have the screening done:  Once every three years after age 40 if you are at a normal weight and have a low risk for diabetes.  More often and at a younger age if you are overweight or have a high risk for diabetes. What should I know about preventing infection? Hepatitis B If you have a higher risk for hepatitis B, you should be screened for this virus. Talk with your health care provider to find out if you are at risk for hepatitis B infection. Hepatitis C Testing is recommended for:  Everyone born from 1945 through 1965.  Anyone with known risk factors for hepatitis C. Sexually transmitted infections (STIs)  Get screened for STIs, including gonorrhea and chlamydia, if: ? You are sexually active and are younger than 61 years of age. ? You are older than 61 years of age and your health care provider tells you that you are at risk for this type of infection. ? Your sexual activity has changed since you were last screened, and you are at increased risk for chlamydia or gonorrhea. Ask your health care provider if   you are at risk.  Ask your health care provider about whether you are at high risk for HIV. Your health care provider may recommend a prescription medicine to help prevent HIV infection. If you choose to take medicine to prevent HIV, you should first get tested for HIV. You should then be tested every 3 months for as long as you are taking the medicine. Pregnancy  If you are about to stop having your period (premenopausal) and  you may become pregnant, seek counseling before you get pregnant.  Take 400 to 800 micrograms (mcg) of folic acid every day if you become pregnant.  Ask for birth control (contraception) if you want to prevent pregnancy. Osteoporosis and menopause Osteoporosis is a disease in which the bones lose minerals and strength with aging. This can result in bone fractures. If you are 65 years old or older, or if you are at risk for osteoporosis and fractures, ask your health care provider if you should:  Be screened for bone loss.  Take a calcium or vitamin D supplement to lower your risk of fractures.  Be given hormone replacement therapy (HRT) to treat symptoms of menopause. Follow these instructions at home: Lifestyle  Do not use any products that contain nicotine or tobacco, such as cigarettes, e-cigarettes, and chewing tobacco. If you need help quitting, ask your health care provider.  Do not use street drugs.  Do not share needles.  Ask your health care provider for help if you need support or information about quitting drugs. Alcohol use  Do not drink alcohol if: ? Your health care provider tells you not to drink. ? You are pregnant, may be pregnant, or are planning to become pregnant.  If you drink alcohol: ? Limit how much you use to 0-1 drink a day. ? Limit intake if you are breastfeeding.  Be aware of how much alcohol is in your drink. In the [place] one drink equals one 12 oz bottle of beer (355 mL), one 5 oz glass of wine (148 mL), or one 1 oz glass of hard liquor (44 mL). General instructions  Schedule regular health, dental, and eye exams.  Stay current with your vaccines.  Tell your health care provider if: ? You often feel depressed. ? You have ever been abused or do not feel safe at home. Summary  Adopting a healthy lifestyle and getting preventive care are important in promoting health and wellness.  Follow your health care provider's instructions about healthy  diet, exercising, and getting tested or screened for diseases.  Follow your health care provider's instructions on monitoring your cholesterol and blood pressure. This information is not intended to replace advice given to you by your health care provider. Make sure you discuss any questions you have with your health care provider. Document Revised: 06/10/2018 Document Reviewed: 06/10/2018 Elsevier Patient Education  2020 [employer] Inc.  Exercising to Lose Weight Exercise is structured, repetitive physical activity to improve fitness and health. Getting regular exercise is important for everyone. It is especially important if you are overweight. Being overweight increases your risk of heart disease, stroke, diabetes, high blood pressure, and several types of cancer. Reducing your calorie intake and exercising can help you lose weight. Exercise is usually categorized as moderate or vigorous intensity. To lose weight, most people need to do a certain amount of moderate-intensity or vigorous-intensity exercise each week. Moderate-intensity exercise  Moderate-intensity exercise is any activity that gets you moving enough to burn at least three times more energy (calories)   than if you were sitting. Examples of moderate exercise include:  Walking a mile in 15 minutes.  Doing light yard work.  Biking at an easy pace. Most people should get at least 150 minutes (2 hours and 30 minutes) a week of moderate-intensity exercise to maintain their body weight. Vigorous-intensity exercise Vigorous-intensity exercise is any activity that gets you moving enough to burn at least six times more calories than if you were sitting. When you exercise at this intensity, you should be working hard enough that you are not able to carry on a conversation. Examples of vigorous exercise include:  Running.  Playing a team sport, such as football, basketball, and soccer.  Jumping rope. Most people should get at least  75 minutes (1 hour and 15 minutes) a week of vigorous-intensity exercise to maintain their body weight. How can exercise affect me? When you exercise enough to burn more calories than you eat, you lose weight. Exercise also reduces body fat and builds muscle. The more muscle you have, the more calories you burn. Exercise also:  Improves mood.  Reduces stress and tension.  Improves your overall fitness, flexibility, and endurance.  Increases bone strength. The amount of exercise you need to lose weight depends on:  Your age.  The type of exercise.  Any health conditions you have.  Your overall physical ability. Talk to your health care provider about how much exercise you need and what types of activities are safe for you. What actions can I take to lose weight? Nutrition   Make changes to your diet as told by your health care provider or diet and nutrition specialist (dietitian). This may include: ? Eating fewer calories. ? Eating more protein. ? Eating less unhealthy fats. ? Eating a diet that includes fresh fruits and vegetables, whole grains, low-fat dairy products, and lean protein. ? Avoiding foods with added fat, salt, and sugar.  Drink plenty of water while you exercise to prevent dehydration or heat stroke. Activity  Choose an activity that you enjoy and set realistic goals. Your health care provider can help you make an exercise plan that works for you.  Exercise at a moderate or vigorous intensity most days of the week. ? The intensity of exercise may vary from person to person. You can tell how intense a workout is for you by paying attention to your breathing and heartbeat. Most people will notice their breathing and heartbeat get faster with more intense exercise.  Do resistance training twice each week, such as: ? Push-ups. ? Sit-ups. ? Lifting weights. ? Using resistance bands.  Getting short amounts of exercise can be just as helpful as long structured  periods of exercise. If you have trouble finding time to exercise, try to include exercise in your daily routine. ? Get up, stretch, and walk around every 30 minutes throughout the day. ? Go for a walk during your lunch break. ? Park your car farther away from your destination. ? If you take public transportation, get off one stop early and walk the rest of the way. ? Make phone calls while standing up and walking around. ? Take the stairs instead of elevators or escalators.  Wear comfortable clothes and shoes with good support.  Do not exercise so much that you hurt yourself, feel dizzy, or get very short of breath. Where to find more information  [place] Department of Health and Human Services: www.hhs.gov  Centers for Disease Control and Prevention (CDC): www.cdc.gov Contact a health care provider:    Before starting a new exercise program.  If you have questions or concerns about your weight.  If you have a medical problem that keeps you from exercising. Get help right away if you have any of the following while exercising:  Injury.  Dizziness.  Difficulty breathing or shortness of breath that does not go away when you stop exercising.  Chest pain.  Rapid heartbeat. Summary  Being overweight increases your risk of heart disease, stroke, diabetes, high blood pressure, and several types of cancer.  Losing weight happens when you burn more calories than you eat.  Reducing the amount of calories you eat in addition to getting regular moderate or vigorous exercise each week helps you lose weight. This information is not intended to replace advice given to you by your health care provider. Make sure you discuss any questions you have with your health care provider. Document Revised: 06/30/2017 Document Reviewed: 06/30/2017 Elsevier Patient Education  2020 Elsevier Inc.   

## 2020-02-04 ENCOUNTER — Ambulatory Visit: Payer: Self-pay

## 2020-02-04 LAB — LIPID PANEL
Chol/HDL Ratio: 3 ratio (ref 0.0–4.4)
Cholesterol, Total: 188 mg/dL (ref 100–199)
HDL: 62 mg/dL (ref >39)
LDL Chol Calc (NIH): 111 mg/dL — ABNORMAL HIGH (ref 0–99)
Triglycerides: 81 mg/dL (ref 0–149)
VLDL Cholesterol Cal: 15 mg/dL (ref 5–40)

## 2020-02-04 LAB — HEMOGLOBIN A1C
Est. average glucose Bld gHb Est-mCnc: 157 mg/dL
Hgb A1c MFr Bld: 7.1 % — ABNORMAL HIGH (ref 4.8–5.6)

## 2020-02-04 LAB — CBC
Hematocrit: 38.3 % (ref 34.0–46.6)
Hemoglobin: 12 g/dL (ref 11.1–15.9)
MCH: 26.4 pg — ABNORMAL LOW (ref 26.6–33.0)
MCHC: 31.3 g/dL — ABNORMAL LOW (ref 31.5–35.7)
MCV: 84 fL (ref 79–97)
Platelets: 249 10*3/uL (ref 150–450)
RBC: 4.54 x10E6/uL (ref 3.77–5.28)
RDW: 13.6 % (ref 11.7–15.4)
WBC: 5.5 10*3/uL (ref 3.4–10.8)

## 2020-02-04 LAB — CMP14+EGFR
ALT: 13 IU/L (ref 0–32)
AST: 15 IU/L (ref 0–40)
Albumin/Globulin Ratio: 1.3 (ref 1.2–2.2)
Albumin: 4 g/dL (ref 3.8–4.9)
Alkaline Phosphatase: 55 IU/L (ref 48–121)
BUN/Creatinine Ratio: 14 (ref 12–28)
BUN: 13 mg/dL (ref 8–27)
Bilirubin Total: 0.4 mg/dL (ref 0.0–1.2)
CO2: 26 mmol/L (ref 20–29)
Calcium: 8.6 mg/dL — ABNORMAL LOW (ref 8.7–10.3)
Chloride: 107 mmol/L — ABNORMAL HIGH (ref 96–106)
Creatinine, Ser: 0.92 mg/dL (ref 0.57–1.00)
GFR calc Af Amer: 78 mL/min/{1.73_m2} (ref >59)
GFR calc non Af Amer: 68 mL/min/{1.73_m2} (ref >59)
Globulin, Total: 3.1 g/dL (ref 1.5–4.5)
Glucose: 129 mg/dL — ABNORMAL HIGH (ref 65–99)
Potassium: 3.7 mmol/L (ref 3.5–5.2)
Sodium: 147 mmol/L — ABNORMAL HIGH (ref 134–144)
Total Protein: 7.1 g/dL (ref 6.0–8.5)

## 2020-02-04 LAB — HIV ANTIBODY (ROUTINE TESTING W REFLEX): HIV Screen 4th Generation wRfx: NONREACTIVE

## 2020-02-07 ENCOUNTER — Other Ambulatory Visit: Payer: Self-pay

## 2020-02-07 NOTE — Patient Outreach (Signed)
[facility]) Care Management  02/07/2020  Danielle Calderon Nov 04, 1960 [id number]   CASE CLOSURE:  No response from patient after outreach calls and outreach letter.  PLAN: will plan case closure today. Will send MD and patient a letter.  Tomasa Rand, RN, BSN, CEN [facility] [employer] [phone]

## 2020-02-08 NOTE — Progress Notes (Signed)
Not seen

## 2020-02-11 ENCOUNTER — Other Ambulatory Visit: Payer: Self-pay

## 2020-02-11 ENCOUNTER — Encounter: Payer: Self-pay | Admitting: Obstetrics and Gynecology

## 2020-02-11 ENCOUNTER — Ambulatory Visit (INDEPENDENT_AMBULATORY_CARE_PROVIDER_SITE_OTHER): Payer: 59 | Admitting: Obstetrics and Gynecology

## 2020-02-11 VITALS — BP 129/84 | HR 70 | Ht 64.0 in | Wt 227.0 lb

## 2020-02-11 DIAGNOSIS — N950 Postmenopausal bleeding: Secondary | ICD-10-CM | POA: Diagnosis not present

## 2020-02-11 NOTE — Progress Notes (Signed)
GYNECOLOGY OFFICE FOLLOW UP NOTE  History:  59 y.o. [id number] here today for follow up for post menopausal bleeding. Has been going on for a little over 6 months, has bleeding two weeks out of every four. Megace improved it a little but not much. Sometimes is very heavy and sometimes is very light, unpredictable.    Past Medical History:  Diagnosis Date  . Depression   . History of migraine headaches   . Hyperlipidemia   . Hypertension     History reviewed. No pertinent surgical history.   Current Outpatient Medications:  .  alendronate (FOSAMAX) 70 MG tablet, TAKE 1 TABLET BY MOUTH EVERY 7 DAYS. TAKE WITH A FULL GLASS OF WATER ON AN EMPTY STOMACH, Disp: 4 tablet, Rfl: 2 .  calcium carbonate 1250 MG capsule, Take 1,250 mg by mouth daily., Disp: , Rfl:  .  cholecalciferol (VITAMIN D) 1000 UNITS tablet, Take 3,000 Units by mouth daily., Disp: , Rfl:  .  citalopram (CELEXA) 40 MG tablet, TAKE 1 TABLET BY MOUTH EVERY DAY, Disp: 30 tablet, Rfl: 5 .  diphenhydrAMINE (SOMINEX) 25 MG tablet, Take 25 mg by mouth at bedtime as needed for sleep., Disp: , Rfl:  .  gabapentin (NEURONTIN) 300 MG capsule, TAKE 1 CAPSULE BY MOUTH TWICE A DAY, Disp: 60 capsule, Rfl: 0 .  omeprazole (PRILOSEC) 20 MG capsule, Take 20 mg by mouth daily., Disp: , Rfl:  .  rosuvastatin (CRESTOR) 5 MG tablet, TAKE 1 TABLET BY MOUTH EVERY DAY, Disp: 30 tablet, Rfl: 2 .  telmisartan (MICARDIS) 40 MG tablet, Take 1 tablet (40 mg total) by mouth daily., Disp: 90 tablet, Rfl: 1  The following portions of the patient's history were reviewed and updated as appropriate: allergies, current medications, past family history, past medical history, past social history, past surgical history and problem list.   Review of Systems:  Pertinent items noted in HPI and remainder of comprehensive ROS otherwise negative.   Objective:  Physical Exam BP 129/84   Pulse 70   Ht 5\' 4"  (1.626 m)   Wt 227 lb (103 kg)   BMI 38.96 kg/m    CONSTITUTIONAL: Well-developed, well-nourished female in no acute distress.  HENT:  Normocephalic, atraumatic. External right and left ear normal. Oropharynx is clear and moist EYES: Conjunctivae and EOM are normal. Pupils are equal, round, and reactive to light. No scleral icterus.  NECK: Normal range of motion, supple, no masses SKIN: Skin is warm and dry. No rash noted. Not diaphoretic. No erythema. No pallor. NEUROLOGIC: Alert and oriented to person, place, and time. Normal reflexes, muscle tone coordination. No cranial nerve deficit noted. PSYCHIATRIC: Normal mood and affect. Normal behavior. Normal judgment and thought content. CARDIOVASCULAR: Normal heart rate noted RESPIRATORY: Effort normal, no problems with respiration noted ABDOMEN: Soft, no distention noted.   PELVIC: deferred MUSCULOSKELETAL: Normal range of motion. No edema noted.  Labs and Imaging No results found.  Assessment & Plan:   1. Post-menopausal bleeding - Given persistent heavy bleeding despite hormonal management and negative EMB, recommend D&C hysteroscopy - reviewed recommendations with patient, risks/benefits - she is agreeable - Reviewed pre-op instructions, expected post-op course. She understands she will need to be NPO after midnight the night prior to the procedure. She understands she will have a PAT visit. She understands she will be called by the administrative scheduler to schedule date/time for the above. Answered all questions, she will call with any issues.  Routine preventative health maintenance measures emphasized. Please refer to  After Visit Summary for other counseling recommendations.   Return for no follow up needed at this time.  Total face-to-face time with patient: 25 minutes. Over 50% of encounter was spent on counseling and coordination of care.  Feliz Beam, M.D. Attending [facility] for [employer] farm manager)

## 2020-02-11 NOTE — Progress Notes (Signed)
GYN presents for consultation for treatment of Postmenopausal bleeding, bled from 7/12-26/2021.

## 2020-02-16 ENCOUNTER — Other Ambulatory Visit: Payer: Self-pay | Admitting: Nurse Practitioner

## 2020-02-16 ENCOUNTER — Encounter: Payer: Self-pay | Admitting: Obstetrics and Gynecology

## 2020-02-16 MED ORDER — METFORMIN HCL 500 MG PO TABS: 500.0000 mg | ORAL_TABLET | Freq: Two times a day (BID) | ORAL | 2 refills | Status: DC

## 2020-03-05 ENCOUNTER — Other Ambulatory Visit: Payer: Self-pay | Admitting: Nurse Practitioner

## 2020-03-05 DIAGNOSIS — M8580 Other specified disorders of bone density and structure, unspecified site: Secondary | ICD-10-CM

## 2020-03-10 ENCOUNTER — Encounter (HOSPITAL_COMMUNITY): Payer: Self-pay | Admitting: Obstetrics and Gynecology

## 2020-03-11 ENCOUNTER — Other Ambulatory Visit (HOSPITAL_COMMUNITY)
Admission: RE | Admit: 2020-03-11 | Discharge: 2020-03-11 | Disposition: A | Discharge status: Home / Self Care | Payer: 59 | Source: Ambulatory Visit | Attending: Obstetrics and Gynecology | Admitting: Obstetrics and Gynecology

## 2020-03-11 DIAGNOSIS — Z01812 Encounter for preprocedural laboratory examination: Secondary | ICD-10-CM | POA: Insufficient documentation

## 2020-03-11 DIAGNOSIS — Z20822 Contact with and (suspected) exposure to covid-19: Secondary | ICD-10-CM | POA: Insufficient documentation

## 2020-03-11 LAB — SARS CORONAVIRUS 2 (TAT 6-24 HRS): SARS Coronavirus 2: NEGATIVE

## 2020-03-13 ENCOUNTER — Other Ambulatory Visit: Payer: Self-pay

## 2020-03-13 ENCOUNTER — Encounter (HOSPITAL_COMMUNITY): Payer: Self-pay | Admitting: Obstetrics and Gynecology

## 2020-03-13 NOTE — Progress Notes (Addendum)
Danielle Calderon denies chest pain or shortness of breath. Patient tested negative for Covid 03/11/20 and has been in quarantine since that time.  Danielle Calderon was diagnosed with type II diabetes 01/2020 and is taking Metformin. I instructed patient to not take Metformin in am. Danielle Calderon doe snot have a CBG machine to check blood sugars.

## 2020-03-14 ENCOUNTER — Other Ambulatory Visit: Payer: Self-pay

## 2020-03-14 ENCOUNTER — Encounter (HOSPITAL_COMMUNITY): Payer: Self-pay | Admitting: Obstetrics and Gynecology

## 2020-03-14 ENCOUNTER — Encounter (HOSPITAL_COMMUNITY)
Admission: RE | Disposition: A | Discharge status: Home / Self Care | Payer: Self-pay | Source: Home / Self Care | Attending: Obstetrics and Gynecology

## 2020-03-14 ENCOUNTER — Ambulatory Visit (HOSPITAL_COMMUNITY): Payer: 59 | Admitting: Anesthesiology

## 2020-03-14 ENCOUNTER — Ambulatory Visit (HOSPITAL_COMMUNITY)
Admission: RE | Admit: 2020-03-14 | Discharge: 2020-03-14 | Disposition: A | Discharge status: Home / Self Care | Payer: 59 | Attending: Obstetrics and Gynecology | Admitting: Obstetrics and Gynecology

## 2020-03-14 DIAGNOSIS — I10 Essential (primary) hypertension: Secondary | ICD-10-CM | POA: Diagnosis not present

## 2020-03-14 DIAGNOSIS — E785 Hyperlipidemia, unspecified: Secondary | ICD-10-CM | POA: Insufficient documentation

## 2020-03-14 DIAGNOSIS — Z87891 Personal history of nicotine dependence: Secondary | ICD-10-CM | POA: Diagnosis not present

## 2020-03-14 DIAGNOSIS — Z79899 Other long term (current) drug therapy: Secondary | ICD-10-CM | POA: Insufficient documentation

## 2020-03-14 DIAGNOSIS — F419 Anxiety disorder, unspecified: Secondary | ICD-10-CM | POA: Diagnosis not present

## 2020-03-14 DIAGNOSIS — N950 Postmenopausal bleeding: Secondary | ICD-10-CM | POA: Diagnosis not present

## 2020-03-14 DIAGNOSIS — F329 Major depressive disorder, single episode, unspecified: Secondary | ICD-10-CM | POA: Diagnosis not present

## 2020-03-14 DIAGNOSIS — E119 Type 2 diabetes mellitus without complications: Secondary | ICD-10-CM | POA: Diagnosis not present

## 2020-03-14 DIAGNOSIS — Z7983 Long term (current) use of bisphosphonates: Secondary | ICD-10-CM | POA: Insufficient documentation

## 2020-03-14 DIAGNOSIS — R69 Illness, unspecified: Secondary | ICD-10-CM | POA: Diagnosis not present

## 2020-03-14 DIAGNOSIS — Z7984 Long term (current) use of oral hypoglycemic drugs: Secondary | ICD-10-CM | POA: Diagnosis not present

## 2020-03-14 DIAGNOSIS — Z888 Allergy status to other drugs, medicaments and biological substances status: Secondary | ICD-10-CM | POA: Diagnosis not present

## 2020-03-14 HISTORY — DX: Anxiety disorder, unspecified: F41.9

## 2020-03-14 HISTORY — DX: Type 2 diabetes mellitus without complications: E11.9

## 2020-03-14 HISTORY — PX: DILATATION & CURETTAGE/HYSTEROSCOPY WITH MYOSURE: SHX6511

## 2020-03-14 LAB — BASIC METABOLIC PANEL
Anion gap: 9 (ref 5–15)
BUN: 8 mg/dL (ref 6–20)
CO2: 26 mmol/L (ref 22–32)
Calcium: 8.6 mg/dL — ABNORMAL LOW (ref 8.9–10.3)
Chloride: 103 mmol/L (ref 98–111)
Creatinine, Ser: 0.83 mg/dL (ref 0.44–1.00)
GFR calc Af Amer: >60 mL/min (ref >60)
GFR calc non Af Amer: >60 mL/min (ref >60)
Glucose, Bld: 103 mg/dL — ABNORMAL HIGH (ref 70–99)
Potassium: 3.3 mmol/L — ABNORMAL LOW (ref 3.5–5.1)
Sodium: 138 mmol/L (ref 135–145)

## 2020-03-14 LAB — GLUCOSE, CAPILLARY
Glucose-Capillary: 107 mg/dL — ABNORMAL HIGH (ref 70–99)
Glucose-Capillary: 86 mg/dL (ref 70–99)

## 2020-03-14 LAB — CBC
HCT: 39.9 % (ref 36.0–46.0)
Hemoglobin: 12.4 g/dL (ref 12.0–15.0)
MCH: 26.1 pg (ref 26.0–34.0)
MCHC: 31.1 g/dL (ref 30.0–36.0)
MCV: 84 fL (ref 80.0–100.0)
Platelets: 251 10*3/uL (ref 150–400)
RBC: 4.75 MIL/uL (ref 3.87–5.11)
RDW: 14.2 % (ref 11.5–15.5)
WBC: 5.1 10*3/uL (ref 4.0–10.5)
nRBC: 0 % (ref 0.0–0.2)

## 2020-03-14 SURGERY — DILATATION & CURETTAGE/HYSTEROSCOPY WITH MYOSURE
Anesthesia: General

## 2020-03-14 MED ORDER — ONDANSETRON HCL 4 MG/2ML IJ SOLN
INTRAMUSCULAR | Status: DC | PRN
  Administered 2020-03-14: 4 mg via INTRAVENOUS

## 2020-03-14 MED ORDER — SOD CITRATE-CITRIC ACID 500-334 MG/5ML PO SOLN
ORAL | Status: AC
  Administered 2020-03-14: 30 mL via ORAL
  Filled 2020-03-14: qty 15

## 2020-03-14 MED ORDER — GLYCOPYRROLATE 0.2 MG/ML IJ SOLN
INTRAMUSCULAR | Status: DC | PRN
  Administered 2020-03-14: .2 mg via INTRAVENOUS

## 2020-03-14 MED ORDER — PROPOFOL 10 MG/ML IV BOLUS
INTRAVENOUS | Status: DC | PRN
  Administered 2020-03-14: 200 mg via INTRAVENOUS

## 2020-03-14 MED ORDER — CHLORHEXIDINE GLUCONATE 0.12 % MT SOLN
15.0000 mL | Freq: Once | OROMUCOSAL | Status: AC
  Administered 2020-03-14: 15 mL via OROMUCOSAL
  Filled 2020-03-14: qty 15

## 2020-03-14 MED ORDER — ORAL CARE MOUTH RINSE: 15.0000 mL | Freq: Once | OROMUCOSAL | Status: AC

## 2020-03-14 MED ORDER — DEXAMETHASONE SODIUM PHOSPHATE 10 MG/ML IJ SOLN
INTRAMUSCULAR | Status: DC | PRN
  Administered 2020-03-14: 10 mg via INTRAVENOUS

## 2020-03-14 MED ORDER — SOD CITRATE-CITRIC ACID 500-334 MG/5ML PO SOLN
30.0000 mL | ORAL | Status: AC
  Filled 2020-03-14 (×2): qty 30

## 2020-03-14 MED ORDER — FENTANYL CITRATE (PF) 250 MCG/5ML IJ SOLN
INTRAMUSCULAR | Status: DC | PRN
Start: 2020-03-14 — End: 2020-03-14
  Administered 2020-03-14: 50 ug via INTRAVENOUS

## 2020-03-14 MED ORDER — MIDAZOLAM HCL 2 MG/2ML IJ SOLN
INTRAMUSCULAR | Status: AC
  Filled 2020-03-14: qty 2

## 2020-03-14 MED ORDER — IBUPROFEN 600 MG PO TABS: 600.0000 mg | ORAL_TABLET | Freq: Four times a day (QID) | ORAL | 1 refills | Status: DC | PRN

## 2020-03-14 MED ORDER — POVIDONE-IODINE 10 % EX SWAB: 2.0000 "application " | Freq: Once | CUTANEOUS | Status: DC

## 2020-03-14 MED ORDER — LIDOCAINE 2% (20 MG/ML) 5 ML SYRINGE
INTRAMUSCULAR | Status: AC
  Filled 2020-03-14: qty 5

## 2020-03-14 MED ORDER — FENTANYL CITRATE (PF) 250 MCG/5ML IJ SOLN
INTRAMUSCULAR | Status: AC
  Filled 2020-03-14: qty 5

## 2020-03-14 MED ORDER — FENTANYL CITRATE (PF) 100 MCG/2ML IJ SOLN: 25.0000 ug | INTRAMUSCULAR | Status: DC | PRN

## 2020-03-14 MED ORDER — FENTANYL CITRATE (PF) 100 MCG/2ML IJ SOLN
INTRAMUSCULAR | Status: DC
Start: 2020-03-14 — End: 2020-03-14
  Filled 2020-03-14: qty 2

## 2020-03-14 MED ORDER — ONDANSETRON HCL 4 MG/2ML IJ SOLN
INTRAMUSCULAR | Status: AC
  Filled 2020-03-14: qty 2

## 2020-03-14 MED ORDER — AMISULPRIDE (ANTIEMETIC) 5 MG/2ML IV SOLN: 10.0000 mg | Freq: Once | INTRAVENOUS | Status: DC | PRN

## 2020-03-14 MED ORDER — LIDOCAINE HCL (CARDIAC) PF 100 MG/5ML IV SOSY
PREFILLED_SYRINGE | INTRAVENOUS | Status: DC | PRN
  Administered 2020-03-14: 60 mg via INTRAVENOUS

## 2020-03-14 MED ORDER — MIDAZOLAM HCL 5 MG/5ML IJ SOLN
INTRAMUSCULAR | Status: DC | PRN
  Administered 2020-03-14: 1 mg via INTRAVENOUS

## 2020-03-14 MED ORDER — SODIUM CHLORIDE 0.9 % IR SOLN
Status: DC | PRN
  Administered 2020-03-14: 1000 mL

## 2020-03-14 MED ORDER — PROPOFOL 10 MG/ML IV BOLUS
INTRAVENOUS | Status: AC
  Filled 2020-03-14: qty 20

## 2020-03-14 MED ORDER — OXYCODONE HCL 5 MG PO TABS: 5.0000 mg | ORAL_TABLET | ORAL | 0 refills | Status: DC | PRN

## 2020-03-14 MED ORDER — DEXAMETHASONE SODIUM PHOSPHATE 10 MG/ML IJ SOLN
INTRAMUSCULAR | Status: AC
  Filled 2020-03-14: qty 1

## 2020-03-14 MED ORDER — LACTATED RINGERS IV SOLN: INTRAVENOUS | Status: DC

## 2020-03-14 SURGICAL SUPPLY — 17 items
CANISTER SUCT 3000ML PPV (MISCELLANEOUS) ×3 IMPLANT
CATH ROBINSON RED A/P 16FR (CATHETERS) ×3 IMPLANT
DEVICE MYOSURE LITE (MISCELLANEOUS) ×3 IMPLANT
GLOVE BIOGEL PI IND STRL 6.5 (GLOVE) ×1 IMPLANT
GLOVE BIOGEL PI IND STRL 7.0 (GLOVE) ×1 IMPLANT
GLOVE BIOGEL PI INDICATOR 6.5 (GLOVE) ×2
GLOVE BIOGEL PI INDICATOR 7.0 (GLOVE) ×2
GLOVE ORTHOPEDIC STR SZ6.5 (GLOVE) ×3 IMPLANT
GOWN STRL REUS W/ TWL LRG LVL3 (GOWN DISPOSABLE) ×2 IMPLANT
GOWN STRL REUS W/TWL LRG LVL3 (GOWN DISPOSABLE) ×6
KIT PROCEDURE FLUENT (KITS) ×3 IMPLANT
KIT TURNOVER KIT B (KITS) ×3 IMPLANT
PACK VAGINAL MINOR WOMEN LF (CUSTOM PROCEDURE TRAY) ×3 IMPLANT
PAD OB MATERNITY 4.3X12.25 (PERSONAL CARE ITEMS) ×3 IMPLANT
SEAL ROD LENS SCOPE MYOSURE (ABLATOR) ×3 IMPLANT
TOWEL GREEN STERILE FF (TOWEL DISPOSABLE) ×6 IMPLANT
UNDERPAD 30X36 HEAVY ABSORB (UNDERPADS AND DIAPERS) ×3 IMPLANT

## 2020-03-14 NOTE — Discharge Instructions (Signed)
Dilation and Curettage, Care After These instructions give you information about caring for yourself after your procedure. Your doctor may also give you more specific instructions. Call your doctor if you have any problems or questions after your procedure. Follow these instructions at home: Activity  Do not drive or use heavy machinery while taking prescription pain medicine.  For 24 hours after your procedure, avoid driving.  Take short walks often, followed by rest periods. Ask your doctor what activities are safe for you. After one or two days, you may be able to return to your normal activities.  Do not lift anything that is heavier than 10 lb (4.5 kg) until your doctor approves.  For at least 2 weeks, or as long as told by your doctor: ? Do not douche. ? Do not use tampons. ? Do not have sex. General instructions   Take over-the-counter and prescription medicines only as told by your doctor. This is very important if you take blood thinning medicine.  Do not take baths, swim, or use a hot tub until your doctor approves. Take showers instead of baths.  Wear compression stockings as told by your doctor.  It is up to you to get the results of your procedure. Ask your doctor when your results will be ready.  Keep all follow-up visits as told by your doctor. This is important. Contact a doctor if:  You have very bad cramps that get worse or do not get better with medicine.  You have very bad pain in your belly (abdomen).  You cannot drink fluids without throwing up (vomiting).  You get pain in a different part of the area between your belly and thighs (pelvis).  You have bad-smelling discharge from your vagina.  You have a rash. Get help right away if:  You are bleeding a lot from your vagina. A lot of bleeding means soaking more than one sanitary pad in an hour, for 2 hours in a row.  You have clumps of blood (blood clots) coming from your vagina.  You have a fever  or chills.  Your belly feels very tender or hard.  You have chest pain.  You have trouble breathing.  You cough up blood.  You feel dizzy.  You feel light-headed.  You pass out (faint).  You have pain in your neck or shoulder area. Summary  Take short walks often, followed by rest periods. Ask your doctor what activities are safe for you. After one or two days, you may be able to return to your normal activities.  Do not lift anything that is heavier than 10 lb (4.5 kg) until your doctor approves.  Do not take baths, swim, or use a hot tub until your doctor approves. Take showers instead of baths.  Contact your doctor if you have any symptoms of infection, like bad-smelling discharge from your vagina. This information is not intended to replace advice given to you by your health care provider. Make sure you discuss any questions you have with your health care provider. Document Revised: 05/30/2017 Document Reviewed: 03/04/2016 Elsevier Patient Education  2020 [employer].

## 2020-03-14 NOTE — Anesthesia Procedure Notes (Signed)
Procedure Name: LMA Insertion Date/Time: 03/14/2020 3:54 PM Performed by: Mariea Clonts, CRNA Pre-anesthesia Checklist: Patient identified, Emergency Drugs available, Suction available and Patient being monitored Patient Re-evaluated:Patient Re-evaluated prior to induction Oxygen Delivery Method: Circle System Utilized Preoxygenation: Pre-oxygenation with 100% oxygen Induction Type: IV induction Ventilation: Mask ventilation without difficulty LMA: LMA inserted LMA Size: 4.0 Number of attempts: 1 Airway Equipment and Method: Bite block Placement Confirmation: positive ETCO2 Tube secured with: Tape Dental Injury: Teeth and Oropharynx as per pre-operative assessment

## 2020-03-14 NOTE — H&P (Signed)
OB/GYN Pre-Op History and Physical  Danielle Calderon is a 59 y.o. [id number] presenting for hysteroscopy D&C for persistent post menopausal bleeding. Benign endometrial biopsy. Recommended for further sampling given heavy ongoing bleeding.      Past Medical History:  Diagnosis Date  . Anxiety   . Depression   . Diabetes mellitus without complication ([place])    Type II  . History of migraine headaches   . Hyperlipidemia   . Hypertension     Past Surgical History:  Procedure Laterality Date  . COLONOSCOPY W/ POLYPECTOMY      OB History  Gravida Para Term Preterm AB Living  3 1 1   2 1   SAB TAB Ectopic Multiple Live Births          1    # Outcome Date GA Lbr Len/2nd Weight Sex Delivery Anes PTL Lv  3 Term     F Vag-Spont   LIV  2 AB           1 AB             Social History   Socioeconomic History  . Marital status: Divorced    Spouse name: Not on file  . Number of children: Not on file  . Years of education: Not on file  . Highest education level: Not on file  Occupational History  . Not on file  Tobacco Use  . Smoking status: Former Smoker    Years: 17.00    Quit date: 2004    Years since quitting: 17.7  . Smokeless tobacco: Never Used  Vaping Use  . Vaping Use: Never used  Substance and Sexual Activity  . Alcohol use: Yes    Alcohol/week: 2.0 standard drinks    Types: 2 Glasses of wine per week  . Drug use: No  . Sexual activity: Not Currently  Other Topics Concern  . Not on file  Social History Narrative  . Not on file   Social Determinants of Health   Financial Resource Strain:   . Difficulty of Paying Living Expenses: Not on file  Food Insecurity:   . Worried About Charity fundraiser in the Last Year: Not on file  . Ran Out of Food in the Last Year: Not on file  Transportation Needs:   . Lack of Transportation (Medical): Not on file  . Lack of Transportation (Non-Medical): Not on file  Physical Activity:   . Days of Exercise per Week: Not on file    . Minutes of Exercise per Session: Not on file  Stress:   . Feeling of Stress : Not on file  Social Connections:   . Frequency of Communication with Friends and Family: Not on file  . Frequency of Social Gatherings with Friends and Family: Not on file  . Attends Religious Services: Not on file  . Active Member of Clubs or Organizations: Not on file  . Attends Archivist Meetings: Not on file  . Marital Status: Not on file    Family History  Problem Relation Age of Onset  . Hypertension Mother   . Hypertension Father   . Cancer Father   . Diabetes Father   . Breast cancer Maternal Aunt   . AAA (abdominal aortic aneurysm) Maternal Aunt     Medications Prior to Admission  Medication Sig Dispense Refill Last Dose  . alendronate (FOSAMAX) 70 MG tablet TAKE 1 TABLET BY MOUTH EVERY 7 DAYS. TAKE WITH A FULL GLASS OF WATER ON AN  EMPTY STOMACH (Patient taking differently: Take 70 mg by mouth once a week. ) 4 tablet 2 03/13/2020 at Unknown time  . calcium carbonate 1250 MG capsule Take 2,400 mg by mouth at bedtime.    03/13/2020 at Unknown time  . cholecalciferol (VITAMIN D) 1000 UNITS tablet Take 2,000 Units by mouth at bedtime.    03/13/2020 at Unknown time  . citalopram (CELEXA) 40 MG tablet TAKE 1 TABLET BY MOUTH EVERY DAY (Patient taking differently: Take 40 mg by mouth at bedtime. ) 30 tablet 5 03/13/2020 at Unknown time  . diphenhydrAMINE (SOMINEX) 25 MG tablet Take 25 mg by mouth at bedtime as needed for allergies.    03/13/2020 at Unknown time  . gabapentin (NEURONTIN) 300 MG capsule TAKE 1 CAPSULE BY MOUTH TWICE A DAY (Patient taking differently: Take 300 mg by mouth 2 (two) times daily. ) 60 capsule 0 03/14/2020 at 0900  . Magnesium 250 MG TABS Take 250 mg by mouth at bedtime.   03/13/2020 at Unknown time  . metFORMIN (GLUCOPHAGE) 500 MG tablet Take 1 tablet (500 mg total) by mouth 2 (two) times daily with a meal. 60 tablet 2 03/13/2020 at Unknown time  . omeprazole (PRILOSEC) 20  MG capsule Take 20 mg by mouth daily.   03/14/2020 at 0900  . rosuvastatin (CRESTOR) 5 MG tablet TAKE 1 TABLET BY MOUTH EVERY DAY (Patient taking differently: Take 5 mg by mouth at bedtime. ) 30 tablet 2 03/13/2020 at Unknown time  . telmisartan (MICARDIS) 40 MG tablet Take 1 tablet (40 mg total) by mouth daily. (Patient taking differently: Take 40 mg by mouth at bedtime. ) 90 tablet 1 03/13/2020 at Unknown time    Allergies  Allergen Reactions  . Lisinopril Cough    Review of Systems: Negative except for what is mentioned in HPI.     Physical Exam: BP (!) 150/96   Pulse 67   Temp 98 F (36.7 C) (Oral)   Resp 20   Ht 5\' 4"  (1.626 m)   Wt 103 kg   SpO2 97%   BMI 38.96 kg/m  CONSTITUTIONAL: Well-developed, well-nourished female in no acute distress.  HENT:  Normocephalic, atraumatic, External right and left ear normal. Oropharynx is clear and moist EYES: Conjunctivae and EOM are normal. Pupils are equal, round, and reactive to light. No scleral icterus.  NECK: Normal range of motion, supple, no masses SKIN: Skin is warm and dry. No rash noted. Not diaphoretic. No erythema. No pallor. [place]: Alert and oriented to person, place, and time. Normal reflexes, muscle tone coordination. No cranial nerve deficit noted. PSYCHIATRIC: Normal mood and affect. Normal behavior. Normal judgment and thought content. CARDIOVASCULAR: Normal heart rate noted RESPIRATORY: Effort normal, no problems with respiration noted ABDOMEN: Soft, nontender, nondistended PELVIC: Deferred MUSCULOSKELETAL: Normal range of motion. No edema and no tenderness. 2+ distal pulses.   Pertinent Labs/Studies:   Results for orders placed or performed during the hospital encounter of 03/14/20 (from the past 72 hour(s))  CBC     Status: None   Collection Time: 03/14/20  2:01 PM  Result Value Ref Range   WBC 5.1 4.0 - 10.5 K/uL   RBC 4.75 3.87 - 5.11 MIL/uL   Hemoglobin 12.4 12.0 - 15.0 g/dL   HCT 39.9 36 - 46 %    MCV 84.0 80.0 - 100.0 fL   MCH 26.1 26.0 - 34.0 pg   MCHC 31.1 30.0 - 36.0 g/dL   RDW 14.2 11.5 - 15.5 %   Platelets  251 150 - 400 K/uL   nRBC 0.0 0.0 - 0.2 %    Comment: Performed at [facility], [place] [address], [place], [place] [address]  Glucose, capillary     Status: Abnormal   Collection Time: 03/14/20  2:25 PM  Result Value Ref Range   Glucose-Capillary 107 (H) 70 - 99 mg/dL    Comment: Glucose reference range applies only to samples taken after fasting for at least 8 hours.   Comment 1 Notify RN    Comment 2 Document in Chart        Assessment and Plan :Ilya Ess is a 59 y.o. [id number] here for hysteroscopy, D&C for persistent post menopausal bleeding. Benign EMB but recommended for further sampling given ongoing bleeding. She is agreeable.  The risks of hysteroscopic D&C were reviewed with the patient; including but not limited to: infection which may require antibiotics; bleeding which may require transfusion or re-operation; injury to bowel, bladder, ureters or other surrounding organs; need for additional procedures including hysterectomy in the event of a life-threatening hemorrhage; thromboembolic phenomenon and other postoperative/anesthesia complications. Reviewed that all sampling will be sent to pathology and further management based on findings. The patient concurred with the proposed plan, giving informed consent for the procedure. She is agreeable to a blood transfusion in the event of emergency.  Patient is NPO Anesthesia aware SCDs  Admission labs To OR when ready   K. Arvilla Meres, M.D. Attending [place], [facility] for [employer], [place]

## 2020-03-14 NOTE — Brief Op Note (Signed)
03/14/2020  4:22 PM  PATIENT:  Danielle Calderon  59 y.o. female  PRE-OPERATIVE DIAGNOSIS:  PMB  POST-OPERATIVE DIAGNOSIS:  PMB  PROCEDURE:  Procedure(s): DILATATION & CURETTAGE/HYSTEROSCOPY WITH MYOSURE (N/A)  SURGEON:  Surgeon(s) and Role:    * Sloan Leiter, MD - Primary  PHYSICIAN ASSISTANT:   ASSISTANTS: none   ANESTHESIA:   general  EBL:  < 20 mL  BLOOD ADMINISTERED:none  DRAINS: none   LOCAL MEDICATIONS USED:  NONE  SPECIMEN:  Source of Specimen:  endometrial curetting  DISPOSITION OF SPECIMEN:  PATHOLOGY  COUNTS:  YES  TOURNIQUET:  * No tourniquets in log *  DICTATION: .Note written in EPIC  PLAN OF CARE: Discharge to home after PACU  PATIENT DISPOSITION:  PACU - hemodynamically stable.   Delay start of Pharmacological VTE agent (>24hrs) due to surgical blood loss or risk of bleeding: no

## 2020-03-14 NOTE — Transfer of Care (Signed)
Immediate Anesthesia Transfer of Care Note  Patient: Danielle Calderon  Procedure(s) Performed: DILATATION & CURETTAGE/HYSTEROSCOPY WITH MYOSURE (N/A )  Patient Location: PACU  Anesthesia Type:General  Level of Consciousness: awake, alert  and oriented  Airway & Oxygen Therapy: Patient Spontanous Breathing  Post-op Assessment: Report given to RN, Post -op Vital signs reviewed and stable and Patient moving all extremities X 4  Post vital signs: Reviewed and stable  Last Vitals:  Vitals Value Taken Time  BP 122/88 03/14/20 1629  Temp    Pulse 71 03/14/20 1631  Resp 14 03/14/20 1631  SpO2 96 % 03/14/20 1631  Vitals shown include unvalidated device data.  Last Pain:  Vitals:   03/14/20 1437  TempSrc:   PainSc: 0-No pain      Patients Stated Pain Goal: 3 (97/53/00 5110)  Complications: No complications documented.

## 2020-03-14 NOTE — Op Note (Signed)
Karrissa Parchment PROCEDURE DATE: 03/14/2020   PREOPERATIVE DIAGNOSIS:  abnormal uterine bleeding, post menopausal bleeding  POSTOPERATIVE DIAGNOSIS: same  PROCEDURE: Operative Hysteroscopy, dilation and curettage  SURGEON:  Dr. Feliz Beam  ASSISTANT:  none  ANESTHESIOLOGY TEAM: Anesthesiologist: Suzette Battiest, MD CRNA: Mariea Clonts, CRNA  INDICATIONS: 59 y.o. [phone]  here for scheduled surgery for the aforementioned diagnoses.   Risks of surgery were discussed with the patient including but not limited to: bleeding which may require transfusion; infection which may require antibiotics; injury to uterus or surrounding organs; intrauterine scarring which may impair future fertility; need for additional procedures including laparotomy or laparoscopy; and other postoperative/anesthesia complications. Written informed consent was obtained.    FINDINGS: Normal appearing female genitalia with multiparous normal appearing cervix.  8 weeks size uterus. Small uterine corpus, minimal endometrial tissue, right ostia visible, left ostia not visible, no acute pathology.  ANESTHESIA:   General FLUID DEFICITS:  240 ml of normal saline ESTIMATED BLOOD LOSS:  Less than 20 ml URINE OUTPUT: 100 mL clear yellow urine SPECIMENS: endometrial curettings sent to pathology COMPLICATIONS:  None immediate.  PROCEDURE DETAILS:  The patient was seen in the pre-op area where the plan was reviewed and she again verbalized understanding and consent.  She was then taken to the operating room where general anesthesia was administered.  After an adequate timeout was performed, she was placed in the dorsal lithotomy position and examined; then prepped and draped in the sterile manner. A straight catheter was inserted into the bladder sterilely and the bladder was drained.  A weighted speculum was then placed in the patient's vagina and a Sims retractor used to visualize the cervix. A single toothed tenaculum was  applied to the anterior lip of the cervix.  The cervix then dilated manually with metal dilators to accommodate the 6 mm operative hysteroscope.  Once the cervix was dilated, the hysteroscope was inserted under direct visualization using normal saline as a suspension medium.  The uterine cavity was carefully examined with the findings as noted above. The operative channel was introduced into the cavity and the endometrium was sampled using the [facility] lite device under direct visualization. Once the lining was sampled in all quadrants, the cavity was again viewed and noted to be free of other pathology. The hysteroscope was then removed under direct visualization.  A serrated curette was used to obtain a moderate amount of endometrial curettings. The products were all sent to pathology.  The tenaculum was removed from the anterior lip of the cervix with good hemostasis noted at the tenaculum site. All instruments were remvoed from the vagina.  The patient tolerated the procedure well and was taken to the recovery area awake, extubated and in stable condition.  The patient will be discharged to home as per PACU criteria.  Routine postoperative instructions given.  She was prescribed oxycodone, Ibuprofen.  She will follow up in the clinic in 2 weeks  for postoperative evaluation.    Feliz Beam, M.D. Attending [place], [facility] for [employer], [place]

## 2020-03-14 NOTE — Anesthesia Preprocedure Evaluation (Signed)
Anesthesia Evaluation  Patient identified by MRN, date of birth, ID band Patient awake    Reviewed: Allergy & Precautions, NPO status , Patient's Chart, lab work & pertinent test results  Airway Mallampati: II  TM Distance: >3 FB Neck ROM: Full    Dental  (+) Dental Advisory Given   Pulmonary former smoker,    breath sounds clear to auscultation       Cardiovascular hypertension, Pt. on medications  Rhythm:Regular Rate:Normal     Neuro/Psych Anxiety Depression negative neurological ROS     GI/Hepatic negative GI ROS, Neg liver ROS,   Endo/Other  diabetes, Type 2, Oral Hypoglycemic Agents  Renal/GU negative Renal ROS     Musculoskeletal   Abdominal   Peds  Hematology negative hematology ROS (+)   Anesthesia Other Findings   Reproductive/Obstetrics                             Anesthesia Physical Anesthesia Plan  ASA: II  Anesthesia Plan: General   Post-op Pain Management:    Induction: Intravenous  PONV Risk Score and Plan: 3 and Midazolam, Dexamethasone, Ondansetron and Treatment may vary due to age or medical condition  Airway Management Planned: LMA  Additional Equipment: None  Intra-op Plan:   Post-operative Plan: Extubation in OR  Informed Consent: I have reviewed the patients History and Physical, chart, labs and discussed the procedure including the risks, benefits and alternatives for the proposed anesthesia with the patient or authorized representative who has indicated his/her understanding and acceptance.     Dental advisory given  Plan Discussed with: CRNA  Anesthesia Plan Comments:         Anesthesia Quick Evaluation

## 2020-03-14 NOTE — Anesthesia Postprocedure Evaluation (Signed)
Anesthesia Post Note  Patient: Danielle Calderon  Procedure(s) Performed: DILATATION & CURETTAGE/HYSTEROSCOPY WITH MYOSURE (N/A )     Patient location during evaluation: PACU Anesthesia Type: General Level of consciousness: awake and alert Pain management: pain level controlled Vital Signs Assessment: post-procedure vital signs reviewed and stable Respiratory status: spontaneous breathing, nonlabored ventilation, respiratory function stable and patient connected to nasal cannula oxygen Cardiovascular status: blood pressure returned to baseline and stable Postop Assessment: no apparent nausea or vomiting Anesthetic complications: no   No complications documented.  Last Vitals:  Vitals:   03/14/20 1715 03/14/20 1730  BP: (!) 146/87 (!) 142/95  Pulse: 66 64  Resp: 14 14  Temp:  36.9 C  SpO2: 92% 94%    Last Pain:  Vitals:   03/14/20 1730  TempSrc:   PainSc: 2                  Tiajuana Amass

## 2020-03-15 ENCOUNTER — Encounter (HOSPITAL_COMMUNITY): Payer: Self-pay | Admitting: Obstetrics and Gynecology

## 2020-03-20 ENCOUNTER — Telehealth: Payer: Self-pay | Admitting: Obstetrics and Gynecology

## 2020-03-20 ENCOUNTER — Telehealth: Payer: Self-pay | Admitting: *Deleted

## 2020-03-20 LAB — SURGICAL PATHOLOGY

## 2020-03-20 NOTE — Telephone Encounter (Signed)
Called and scheduled the patient for a new patient appt tomorrow

## 2020-03-20 NOTE — H&P (View-Only) (Signed)
GYNECOLOGIC ONCOLOGY NEW PATIENT CONSULTATION   Patient Name: Danielle Calderon  Patient Age: 59 y.o. Date of Service: 03/21/20 Referring Provider: Vivien Rota MD  Primary Care Provider: [name], [place] Consulting Provider: Jeral Pinch, MD   Assessment/Plan:  We reviewed the diagnosis of atypical hyperplasia and the treatment options, including medical management (Mirena IUD or progesterone PO) or hysterectomy.    In the setting of someone who is postmenopausal and a good surgical candidate, standard of care would be definitive surgery. The patient is a suitable candidate for hysterectomy via a minimally invasive approach to surgery.  Given that she is postmenopausal, a bilateral salpingo-oophorectomy is also recommended.  We reviewed that robotic assistance would be used to complete the surgery.  We discussed that endometrial cancer is detected in about 40% of final uterine pathology specimens from patients with CAH.  I think given the focal atypical hyperplasia, her risk of underlying malignancy is likely much lower than this.  We will thus plan to send the uterus for intraoperative frozen pathology.  If cancer is identified at the time of surgery, additional procedures including lymph node evaluation, is recommended.  I discussed the possibility of sentinel lymph node biopsy at the onset of the procedure, highlighting that if no cancer or a low-grade early cancer is found, then this would be overtreatment.  The patient ultimately wished to proceed with lymph node assessment only if indicated by frozen section.  We discussed plan for a robotic assisted hysterectomy, bilateral salpingo-oophorectomy, sentinel lymph node evaluation, possible lymph node dissection, possible laparotomy. The risks of surgery were discussed in detail and she understands these to include infection; wound separation; hernia; vaginal cuff separation, injury to adjacent organs such as bowel, bladder, blood vessels, ureters  and nerves; bleeding which may require blood transfusion; anesthesia risk; thromboembolic events; possible death; unforeseen complications; possible need for re-exploration; medical complications such as heart attack, stroke, pleural effusion and pneumonia; and, if full lymphadenectomy is performed the risk of lymphedema and lymphocyst. The patient will receive DVT and antibiotic prophylaxis as indicated. She voiced a clear understanding. She had the opportunity to ask questions. Perioperative instructions were reviewed with her. Prescriptions for post-op medications were sent to her pharmacy of choice.   We also reviewed the importance of weight loss in overall health as well as reduction in cancer risk.  I have offered nutrition/exercise counseling today.  The patient is already working on her diet and exercise for diabetes, and we will continue this conversation after surgery.  A copy of this note was sent to the patient's referring provider.   55 minutes of total time was spent for this patient encounter, including preparation, face-to-face counseling with the patient and coordination of care, and documentation of the encounter.   Jeral Pinch, MD  Division of Gynecologic Oncology  Department of Obstetrics and Gynecology  [facility]  ___________________________________________  Chief Complaint: Chief Complaint  Patient presents with  . New Patient (Initial Visit)    endometrial hyperplasia    History of Present Illness:  Danielle Calderon is a 59 y.o. y.o. female who is seen in consultation at the request of Dr. Rosana Hoes for an evaluation of atypical endometrial hyperplasia.  The patient initially presented in late 2020 after developing scant vaginal bleeding in July of that year.  Initially, this was very occasional.  In January or so, the bleeding became heavier and would last for 2 weeks and then she would stop bleeding for 2 weeks.  That pattern  has continued  until her recent D&C procedure.  At some point she thinks in January, she was started on Megace for several weeks without much effect.  She underwent initial endometrial biopsy in late January showing scant atrophic appearing endometrium in the background of endocervical glandular and squamous epithelium.  Given persistent bleeding, an ultrasound was obtained in May of this year that showed a thin endometrial lining measuring 4 mm.  She underwent repeat sampling recently that showed focal area of atypical hyperplasia.  Overall the patient reports doing well.  She endorses a good appetite without any nausea or emesis.  She reports normal bowel and bladder function.  She had very scant bleeding for about 2 days after her recent surgery but now has had none since.  She has some mild cramping as well since the procedure.  Her past medical history is notable for diabetes, with a most recent hemoglobin A1c of 7.1 in August.  She uses diet and exercise as well as Metformin.  She denies any pulmonary or cardiac medical history and denies any shortness of breath or chest pain with ambulation or at rest.  PAST MEDICAL HISTORY:  Past Medical History:  Diagnosis Date  . Anxiety   . Depression   . Diabetes mellitus without complication ([place])    Type II  . Fatigue 01/19/2019  . History of migraine headaches   . Hyperlipidemia   . Hypertension      PAST SURGICAL HISTORY:  Past Surgical History:  Procedure Laterality Date  . COLONOSCOPY W/ POLYPECTOMY    . DILATATION & CURETTAGE/HYSTEROSCOPY WITH MYOSURE N/A 03/14/2020   Procedure: DILATATION & CURETTAGE/HYSTEROSCOPY WITH MYOSURE;  Surgeon: Sloan Leiter, MD;  Location: [place];  Service: Gynecology;  Laterality: N/A;    OB/GYN HISTORY:  OB History  Gravida Para Term Preterm AB Living  3 1 1   2 1   SAB TAB Ectopic Multiple Live Births          1    # Outcome Date GA Lbr Len/2nd Weight Sex Delivery Anes PTL Lv  3 Term     F Vag-Spont   LIV  2 AB            1 AB             No LMP recorded. Patient is postmenopausal.  Age at menarche: 50  Age at menopause: 8 Hx of HRT: no Hx of STDs: no Last pap: 05/2019 - negative, HPV negative History of abnormal pap smears: no  SCREENING STUDIES:  Last mammogram: 05/2019  Last colonoscopy: 06/2019 Last bone mineral density: 10/2017  MEDICATIONS: Outpatient Encounter Medications as of 03/21/2020  Medication Sig  . alendronate (FOSAMAX) 70 MG tablet TAKE 1 TABLET BY MOUTH EVERY 7 DAYS. TAKE WITH A FULL GLASS OF WATER ON AN EMPTY STOMACH (Patient taking differently: Take 70 mg by mouth once a week. )  . calcium carbonate 1250 MG capsule Take 2,400 mg by mouth at bedtime.   . cholecalciferol (VITAMIN D) 1000 UNITS tablet Take 2,000 Units by mouth at bedtime.   . citalopram (CELEXA) 40 MG tablet TAKE 1 TABLET BY MOUTH EVERY DAY (Patient taking differently: Take 40 mg by mouth at bedtime. )  . diphenhydrAMINE (SOMINEX) 25 MG tablet Take 25 mg by mouth at bedtime as needed for allergies.   Marland Kitchen gabapentin (NEURONTIN) 300 MG capsule TAKE 1 CAPSULE BY MOUTH TWICE A DAY (Patient taking differently: Take 300 mg by mouth 2 (two) times daily. )  .  ibuprofen (ADVIL) 600 MG tablet Take 1 tablet (600 mg total) by mouth every 6 (six) hours as needed for headache, mild pain, moderate pain or cramping.  . Magnesium 250 MG TABS Take 250 mg by mouth at bedtime.  . metFORMIN (GLUCOPHAGE) 500 MG tablet Take 1 tablet (500 mg total) by mouth 2 (two) times daily with a meal.  . omeprazole (PRILOSEC) 20 MG capsule Take 20 mg by mouth daily.  Marland Kitchen oxyCODONE (ROXICODONE) 5 MG immediate release tablet Take 1 tablet (5 mg total) by mouth every 4 (four) hours as needed for severe pain.  . rosuvastatin (CRESTOR) 5 MG tablet TAKE 1 TABLET BY MOUTH EVERY DAY (Patient taking differently: Take 5 mg by mouth at bedtime. )  . telmisartan (MICARDIS) 40 MG tablet Take 1 tablet (40 mg total) by mouth daily. (Patient taking differently: Take 40  mg by mouth at bedtime. )  . senna-docusate (SENOKOT-S) 8.6-50 MG tablet Take 2 tablets by mouth at bedtime. For AFTER surgery, do not take if having diarrhea   No facility-administered encounter medications on file as of 03/21/2020.    ALLERGIES:  Allergies  Allergen Reactions  . Lisinopril Cough    Other reaction(s): Cough (ALLERGY/intolerance)     FAMILY HISTORY:  Family History  Problem Relation Age of Onset  . Hypertension Mother   . Hypertension Father   . Cancer Father   . Diabetes Father   . Breast cancer Maternal Aunt   . AAA (abdominal aortic aneurysm) Maternal Aunt   . Cancer Maternal Great-grandmother        cervical vs. uterine (sounds like cervical, treated with cryotherapy)  . Breast cancer Cousin        maternal     SOCIAL HISTORY:    Social Connections:   . Frequency of Communication with Friends and Family: Not on file  . Frequency of Social Gatherings with Friends and Family: Not on file  . Attends Religious Services: Not on file  . Active Member of Clubs or Organizations: Not on file  . Attends Archivist Meetings: Not on file  . Marital Status: Not on file    REVIEW OF SYSTEMS:  Denies appetite changes, fevers, chills, fatigue, unexplained weight changes. Denies hearing loss, neck lumps or masses, mouth sores, ringing in ears or voice changes. Denies cough or wheezing.  Denies shortness of breath. Denies chest pain or palpitations. Denies leg swelling. Denies abdominal distention, pain, blood in stools, constipation, diarrhea, nausea, vomiting, or early satiety. Denies pain with intercourse, dysuria, frequency, hematuria or incontinence. Denies hot flashes, pelvic pain, vaginal bleeding or vaginal discharge.   Denies joint pain, back pain or muscle pain/cramps. Denies itching, rash, or wounds. Denies dizziness, headaches, numbness or seizures. Denies swollen lymph nodes or glands, denies easy bruising or bleeding. Denies anxiety,  depression, confusion, or decreased concentration.  Physical Exam:  Vital Signs for this encounter:  Blood pressure (!) 143/70, pulse 65, temperature 98.2 F (36.8 C), temperature source Tympanic, resp. rate 18, height 5\' 4"  (1.626 m), weight 223 lb 3.2 oz (101.2 kg), SpO2 98 %. Body mass index is 38.31 kg/m. General: Alert, oriented, no acute distress.  HEENT: Normocephalic, atraumatic. Sclera anicteric.  Chest: Clear to auscultation bilaterally. No wheezes, rhonchi, or rales. Cardiovascular: Regular rate and rhythm, no murmurs, rubs, or gallops.  Abdomen: Obese. Normoactive bowel sounds. Soft, nondistended, nontender to palpation. No masses or hepatosplenomegaly appreciated. No palpable fluid wave.  Extremities: Grossly normal range of motion. Warm, well perfused. No edema  bilaterally.  Skin: No rashes or lesions.  Lymphatics: No cervical, supraclavicular, or inguinal adenopathy.  GU:  Normal external female genitalia. No lesions. No discharge or bleeding.             Bladder/urethra:  No lesions or masses, well supported bladder             Vagina: mildly atrophic.             Cervix: Normal appearing, no lesions. Anterior.             Uterus: Small, mobile, no parametrial involvement or nodularity.             Adnexa: No masses appreciated.  LABORATORY AND RADIOLOGIC DATA:  Outside medical records were reviewed to synthesize the above history, along with the history and physical obtained during the visit.   Lab Results  Component Value Date   WBC 5.1 03/14/2020   HGB 12.4 03/14/2020   HCT 39.9 03/14/2020   PLT 251 03/14/2020   GLUCOSE 103 (H) 03/14/2020   CHOL 188 02/03/2020   TRIG 81 02/03/2020   HDL 62 02/03/2020   LDLCALC 111 (H) 02/03/2020   ALT 13 02/03/2020   AST 15 02/03/2020   NA 138 03/14/2020   K 3.3 (L) 03/14/2020   CL 103 03/14/2020   CREATININE 0.83 03/14/2020   BUN 8 03/14/2020   CO2 26 03/14/2020   HGBA1C 7.1 (H) 02/03/2020   MICROALBUR 10 02/03/2020    D&C on 9/14: A. ENDOMETRIUM, CURETTAGE:  - Small focus suspicious for atypical hyperplasia, see comment  - Fragments of endometrial polyp   COMMENT:   Dr. Saralyn Pilar and Dr. Jeannie Done reviewed the case and concur with the diagnosis.    Pelvic ultrasound on 11/08/19: IMPRESSION: Small subserosal leiomyoma at uterine fundus 2.4 cm diameter.  Otherwise normal exam.  Specifically endometrial complex is normal in thickness at 4 mm thick without focal abnormality.

## 2020-03-20 NOTE — Telephone Encounter (Signed)
Called patient to review pathology, need for hysterectomy given atypical endometrial hyperplasia on pathology. Reviewed that she will be referred to [place] office for surgery, (previously discussed with Joylene John), to follow up with me for post op and to let us know if she does not hear from [place] to schedule an appointment in the near future. Patient verbalizes understanding and importance of follow up. No further questions.   Feliz Beam, M.D. Attending Center for [employer] [facility])

## 2020-03-20 NOTE — Progress Notes (Signed)
GYNECOLOGIC ONCOLOGY NEW PATIENT CONSULTATION   Patient Name: Danielle Calderon  Patient Age: 59 y.o. Date of Service: 03/21/20 Referring Provider: Vivien Rota MD  Primary Care Provider: [name], [place] Consulting Provider: Jeral Pinch, MD   Assessment/Plan:  We reviewed the diagnosis of atypical hyperplasia and the treatment options, including medical management (Mirena IUD or progesterone PO) or hysterectomy.    In the setting of someone who is postmenopausal and a good surgical candidate, standard of care would be definitive surgery. The patient is a suitable candidate for hysterectomy via a minimally invasive approach to surgery.  Given that she is postmenopausal, a bilateral salpingo-oophorectomy is also recommended.  We reviewed that robotic assistance would be used to complete the surgery.  We discussed that endometrial cancer is detected in about 40% of final uterine pathology specimens from patients with CAH.  I think given the focal atypical hyperplasia, her risk of underlying malignancy is likely much lower than this.  We will thus plan to send the uterus for intraoperative frozen pathology.  If cancer is identified at the time of surgery, additional procedures including lymph node evaluation, is recommended.  I discussed the possibility of sentinel lymph node biopsy at the onset of the procedure, highlighting that if no cancer or a low-grade early cancer is found, then this would be overtreatment.  The patient ultimately wished to proceed with lymph node assessment only if indicated by frozen section.  We discussed plan for a robotic assisted hysterectomy, bilateral salpingo-oophorectomy, sentinel lymph node evaluation, possible lymph node dissection, possible laparotomy. The risks of surgery were discussed in detail and she understands these to include infection; wound separation; hernia; vaginal cuff separation, injury to adjacent organs such as bowel, bladder, blood vessels, ureters  and nerves; bleeding which may require blood transfusion; anesthesia risk; thromboembolic events; possible death; unforeseen complications; possible need for re-exploration; medical complications such as heart attack, stroke, pleural effusion and pneumonia; and, if full lymphadenectomy is performed the risk of lymphedema and lymphocyst. The patient will receive DVT and antibiotic prophylaxis as indicated. She voiced a clear understanding. She had the opportunity to ask questions. Perioperative instructions were reviewed with her. Prescriptions for post-op medications were sent to her pharmacy of choice.   We also reviewed the importance of weight loss in overall health as well as reduction in cancer risk.  I have offered nutrition/exercise counseling today.  The patient is already working on her diet and exercise for diabetes, and we will continue this conversation after surgery.  A copy of this note was sent to the patient's referring provider.   55 minutes of total time was spent for this patient encounter, including preparation, face-to-face counseling with the patient and coordination of care, and documentation of the encounter.   Jeral Pinch, MD  Division of Gynecologic Oncology  Department of Obstetrics and Gynecology  [facility]  ___________________________________________  Chief Complaint: Chief Complaint  Patient presents with  . New Patient (Initial Visit)    endometrial hyperplasia    History of Present Illness:  Danielle Calderon is a 59 y.o. y.o. female who is seen in consultation at the request of Dr. Rosana Hoes for an evaluation of atypical endometrial hyperplasia.  The patient initially presented in late 2020 after developing scant vaginal bleeding in July of that year.  Initially, this was very occasional.  In January or so, the bleeding became heavier and would last for 2 weeks and then she would stop bleeding for 2 weeks.  That pattern  has continued  until her recent D&C procedure.  At some point she thinks in January, she was started on Megace for several weeks without much effect.  She underwent initial endometrial biopsy in late January showing scant atrophic appearing endometrium in the background of endocervical glandular and squamous epithelium.  Given persistent bleeding, an ultrasound was obtained in May of this year that showed a thin endometrial lining measuring 4 mm.  She underwent repeat sampling recently that showed focal area of atypical hyperplasia.  Overall the patient reports doing well.  She endorses a good appetite without any nausea or emesis.  She reports normal bowel and bladder function.  She had very scant bleeding for about 2 days after her recent surgery but now has had none since.  She has some mild cramping as well since the procedure.  Her past medical history is notable for diabetes, with a most recent hemoglobin A1c of 7.1 in August.  She uses diet and exercise as well as Metformin.  She denies any pulmonary or cardiac medical history and denies any shortness of breath or chest pain with ambulation or at rest.  PAST MEDICAL HISTORY:  Past Medical History:  Diagnosis Date  . Anxiety   . Depression   . Diabetes mellitus without complication ([place])    Type II  . Fatigue 01/19/2019  . History of migraine headaches   . Hyperlipidemia   . Hypertension      PAST SURGICAL HISTORY:  Past Surgical History:  Procedure Laterality Date  . COLONOSCOPY W/ POLYPECTOMY    . DILATATION & CURETTAGE/HYSTEROSCOPY WITH MYOSURE N/A 03/14/2020   Procedure: DILATATION & CURETTAGE/HYSTEROSCOPY WITH MYOSURE;  Surgeon: Sloan Leiter, MD;  Location: [facility];  Service: Gynecology;  Laterality: N/A;    OB/GYN HISTORY:  OB History  Gravida Para Term Preterm AB Living  3 1 1   2 1   SAB TAB Ectopic Multiple Live Births          1    # Outcome Date GA Lbr Len/2nd Weight Sex Delivery Anes PTL Lv  3 Term     F Vag-Spont   LIV  2 AB            1 AB             No LMP recorded. Patient is postmenopausal.  Age at menarche: 59  Age at menopause: 59 Hx of HRT: no Hx of STDs: no Last pap: 05/2019 - negative, HPV negative History of abnormal pap smears: no  SCREENING STUDIES:  Last mammogram: 05/2019  Last colonoscopy: 06/2019 Last bone mineral density: 10/2017  MEDICATIONS: Outpatient Encounter Medications as of 03/21/2020  Medication Sig  . alendronate (FOSAMAX) 70 MG tablet TAKE 1 TABLET BY MOUTH EVERY 7 DAYS. TAKE WITH A FULL GLASS OF WATER ON AN EMPTY STOMACH (Patient taking differently: Take 70 mg by mouth once a week. )  . calcium carbonate 1250 MG capsule Take 2,400 mg by mouth at bedtime.   . cholecalciferol (VITAMIN D) 1000 UNITS tablet Take 2,000 Units by mouth at bedtime.   . citalopram (CELEXA) 40 MG tablet TAKE 1 TABLET BY MOUTH EVERY DAY (Patient taking differently: Take 40 mg by mouth at bedtime. )  . diphenhydrAMINE (SOMINEX) 25 MG tablet Take 25 mg by mouth at bedtime as needed for allergies.   Marland Kitchen gabapentin (NEURONTIN) 300 MG capsule TAKE 1 CAPSULE BY MOUTH TWICE A DAY (Patient taking differently: Take 300 mg by mouth 2 (two) times daily. )  .  ibuprofen (ADVIL) 600 MG tablet Take 1 tablet (600 mg total) by mouth every 6 (six) hours as needed for headache, mild pain, moderate pain or cramping.  . Magnesium 250 MG TABS Take 250 mg by mouth at bedtime.  . metFORMIN (GLUCOPHAGE) 500 MG tablet Take 1 tablet (500 mg total) by mouth 2 (two) times daily with a meal.  . omeprazole (PRILOSEC) 20 MG capsule Take 20 mg by mouth daily.  Marland Kitchen oxyCODONE (ROXICODONE) 5 MG immediate release tablet Take 1 tablet (5 mg total) by mouth every 4 (four) hours as needed for severe pain.  . rosuvastatin (CRESTOR) 5 MG tablet TAKE 1 TABLET BY MOUTH EVERY DAY (Patient taking differently: Take 5 mg by mouth at bedtime. )  . telmisartan (MICARDIS) 40 MG tablet Take 1 tablet (40 mg total) by mouth daily. (Patient taking differently: Take 40  mg by mouth at bedtime. )  . senna-docusate (SENOKOT-S) 8.6-50 MG tablet Take 2 tablets by mouth at bedtime. For AFTER surgery, do not take if having diarrhea   No facility-administered encounter medications on file as of 03/21/2020.    ALLERGIES:  Allergies  Allergen Reactions  . Lisinopril Cough    Other reaction(s): Cough (ALLERGY/intolerance)     FAMILY HISTORY:  Family History  Problem Relation Age of Onset  . Hypertension Mother   . Hypertension Father   . Cancer Father   . Diabetes Father   . Breast cancer Maternal Aunt   . AAA (abdominal aortic aneurysm) Maternal Aunt   . Cancer Maternal Great-grandmother        cervical vs. uterine (sounds like cervical, treated with cryotherapy)  . Breast cancer Cousin        maternal     SOCIAL HISTORY:    Social Connections:   . Frequency of Communication with Friends and Family: Not on file  . Frequency of Social Gatherings with Friends and Family: Not on file  . Attends Religious Services: Not on file  . Active Member of Clubs or Organizations: Not on file  . Attends Archivist Meetings: Not on file  . Marital Status: Not on file    REVIEW OF SYSTEMS:  Denies appetite changes, fevers, chills, fatigue, unexplained weight changes. Denies hearing loss, neck lumps or masses, mouth sores, ringing in ears or voice changes. Denies cough or wheezing.  Denies shortness of breath. Denies chest pain or palpitations. Denies leg swelling. Denies abdominal distention, pain, blood in stools, constipation, diarrhea, nausea, vomiting, or early satiety. Denies pain with intercourse, dysuria, frequency, hematuria or incontinence. Denies hot flashes, pelvic pain, vaginal bleeding or vaginal discharge.   Denies joint pain, back pain or muscle pain/cramps. Denies itching, rash, or wounds. Denies dizziness, headaches, numbness or seizures. Denies swollen lymph nodes or glands, denies easy bruising or bleeding. Denies anxiety,  depression, confusion, or decreased concentration.  Physical Exam:  Vital Signs for this encounter:  Blood pressure (!) 143/70, pulse 65, temperature 98.2 F (36.8 C), temperature source Tympanic, resp. rate 18, height 5\' 4"  (1.626 m), weight 223 lb 3.2 oz (101.2 kg), SpO2 98 %. Body mass index is 38.31 kg/m. General: Alert, oriented, no acute distress.  HEENT: Normocephalic, atraumatic. Sclera anicteric.  Chest: Clear to auscultation bilaterally. No wheezes, rhonchi, or rales. Cardiovascular: Regular rate and rhythm, no murmurs, rubs, or gallops.  Abdomen: Obese. Normoactive bowel sounds. Soft, nondistended, nontender to palpation. No masses or hepatosplenomegaly appreciated. No palpable fluid wave.  Extremities: Grossly normal range of motion. Warm, well perfused. No edema  bilaterally.  Skin: No rashes or lesions.  Lymphatics: No cervical, supraclavicular, or inguinal adenopathy.  GU:  Normal external female genitalia. No lesions. No discharge or bleeding.             Bladder/urethra:  No lesions or masses, well supported bladder             Vagina: mildly atrophic.             Cervix: Normal appearing, no lesions. Anterior.             Uterus: Small, mobile, no parametrial involvement or nodularity.             Adnexa: No masses appreciated.  LABORATORY AND RADIOLOGIC DATA:  Outside medical records were reviewed to synthesize the above history, along with the history and physical obtained during the visit.   Lab Results  Component Value Date   WBC 5.1 03/14/2020   HGB 12.4 03/14/2020   HCT 39.9 03/14/2020   PLT 251 03/14/2020   GLUCOSE 103 (H) 03/14/2020   CHOL 188 02/03/2020   TRIG 81 02/03/2020   HDL 62 02/03/2020   LDLCALC 111 (H) 02/03/2020   ALT 13 02/03/2020   AST 15 02/03/2020   NA 138 03/14/2020   K 3.3 (L) 03/14/2020   CL 103 03/14/2020   CREATININE 0.83 03/14/2020   BUN 8 03/14/2020   CO2 26 03/14/2020   HGBA1C 7.1 (H) 02/03/2020   MICROALBUR 10 02/03/2020    D&C on 9/14: A. ENDOMETRIUM, CURETTAGE:  - Small focus suspicious for atypical hyperplasia, see comment  - Fragments of endometrial polyp   COMMENT:   Dr. Saralyn Pilar and Dr. Jeannie Done reviewed the case and concur with the diagnosis.    Pelvic ultrasound on 11/08/19: IMPRESSION: Small subserosal leiomyoma at uterine fundus 2.4 cm diameter.  Otherwise normal exam.  Specifically endometrial complex is normal in thickness at 4 mm thick without focal abnormality.

## 2020-03-21 ENCOUNTER — Inpatient Hospital Stay: Payer: 59 | Attending: Gynecologic Oncology | Admitting: Gynecologic Oncology

## 2020-03-21 ENCOUNTER — Encounter: Payer: Self-pay | Admitting: Gynecologic Oncology

## 2020-03-21 ENCOUNTER — Other Ambulatory Visit: Payer: Self-pay | Admitting: Gynecologic Oncology

## 2020-03-21 ENCOUNTER — Other Ambulatory Visit: Payer: Self-pay

## 2020-03-21 VITALS — BP 143/70 | HR 65 | Temp 98.2°F | Resp 18 | Ht 64.0 in | Wt 223.2 lb

## 2020-03-21 DIAGNOSIS — I10 Essential (primary) hypertension: Secondary | ICD-10-CM | POA: Diagnosis not present

## 2020-03-21 DIAGNOSIS — R69 Illness, unspecified: Secondary | ICD-10-CM | POA: Diagnosis not present

## 2020-03-21 DIAGNOSIS — Z8249 Family history of ischemic heart disease and other diseases of the circulatory system: Secondary | ICD-10-CM | POA: Diagnosis not present

## 2020-03-21 DIAGNOSIS — N8500 Endometrial hyperplasia, unspecified: Secondary | ICD-10-CM

## 2020-03-21 DIAGNOSIS — E669 Obesity, unspecified: Secondary | ICD-10-CM | POA: Diagnosis not present

## 2020-03-21 DIAGNOSIS — Z7984 Long term (current) use of oral hypoglycemic drugs: Secondary | ICD-10-CM | POA: Insufficient documentation

## 2020-03-21 DIAGNOSIS — E785 Hyperlipidemia, unspecified: Secondary | ICD-10-CM | POA: Diagnosis not present

## 2020-03-21 DIAGNOSIS — Z833 Family history of diabetes mellitus: Secondary | ICD-10-CM | POA: Insufficient documentation

## 2020-03-21 DIAGNOSIS — F329 Major depressive disorder, single episode, unspecified: Secondary | ICD-10-CM | POA: Insufficient documentation

## 2020-03-21 DIAGNOSIS — Z803 Family history of malignant neoplasm of breast: Secondary | ICD-10-CM | POA: Diagnosis not present

## 2020-03-21 DIAGNOSIS — Z79899 Other long term (current) drug therapy: Secondary | ICD-10-CM | POA: Diagnosis not present

## 2020-03-21 DIAGNOSIS — D252 Subserosal leiomyoma of uterus: Secondary | ICD-10-CM | POA: Diagnosis not present

## 2020-03-21 DIAGNOSIS — E119 Type 2 diabetes mellitus without complications: Secondary | ICD-10-CM | POA: Insufficient documentation

## 2020-03-21 HISTORY — DX: Obesity, unspecified: E66.9

## 2020-03-21 MED ORDER — SENNOSIDES-DOCUSATE SODIUM 8.6-50 MG PO TABS: 2.0000 | ORAL_TABLET | Freq: Every day | ORAL | 0 refills | Status: DC

## 2020-03-21 NOTE — Patient Instructions (Addendum)
Preparing for your Surgery  Plan for surgery on March 27, 2020 with Dr. Jeral Pinch at [facility]. You will be scheduled for a robotic assisted total laparoscopic hysterectomy, bilateral salpingo-oophorectomy, possible lymph node dissection, possible laparotomy.   Pre-operative Testing -You will receive a phone call from presurgical testing at [facility] to arrange for a pre-operative appointment over the phone, lab appointment, and COVID test. The COVID test normally happens 3 days prior to the surgery and they ask that you self quarantine after the test up until surgery to decrease chance of exposure.  -Bring your insurance card, copy of an advanced directive if applicable, medication list  -At that visit, you will be asked to sign a consent for a possible blood transfusion in case a transfusion becomes necessary during surgery.  The need for a blood transfusion is rare but having consent is a necessary part of your care.     -You should not be taking blood thinners or aspirin at least ten days prior to surgery unless instructed by your surgeon. You can use tylenol for pain and limit use of ibuprofen.  -Do not take supplements such as fish oil (omega 3), red yeast rice, turmeric before your surgery.   Day Before Surgery at [place] will be asked to take in a light diet the day before surgery. You will be advised you can have clear liquids up until 3 hours before your surgery.    Eat a light diet the day before surgery.  Examples including soups, broths, toast, yogurt, mashed potatoes.  AVOID GAS PRODUCING FOODS. Things to avoid include carbonated beverages (fizzy beverages), raw fruits and raw vegetables, or beans.   If your bowels are filled with gas, your surgeon will have difficulty visualizing your pelvic organs which increases your surgical risks.  Your role in recovery Your role is to become active as soon as directed by your doctor, while  still giving yourself time to heal.  Rest when you feel tired. You will be asked to do the following in order to speed your recovery:  - Cough and breathe deeply. This helps to clear and expand your lungs and can prevent pneumonia after surgery.  - [place]. Do mild physical activity. Walking or moving your legs help your circulation and body functions return to normal. Do not try to get up or walk alone the first time after surgery.   -If you develop swelling on one leg or the other, pain in the back of your leg, redness/warmth in one of your legs, please call the office or go to the Emergency Room to have a doppler to rule out a blood clot. For shortness of breath, chest pain-seek care in the Emergency Room as soon as possible. - Actively manage your pain. Managing your pain lets you move in comfort. We will ask you to rate your pain on a scale of zero to 10. It is your responsibility to tell your doctor or nurse where and how much you hurt so your pain can be treated.  Special Considerations -If you are diabetic, you may be placed on insulin after surgery to have closer control over your blood sugars to promote healing and recovery.  This does not mean that you will be discharged on insulin.  If applicable, your oral antidiabetics will be resumed when you are tolerating a solid diet.  -Your final pathology results from surgery should be available around one week after surgery  and the results will be relayed to you when available.  -FMLA forms can be faxed to [phone] and please allow 5-7 business days for completion.  Pain Management After Surgery -Make sure that you have Tylenol and Ibuprofen at home to use on a regular basis after surgery for pain control. We recommend alternating the medications every hour to six hours since they work differently and are processed in the body differently for pain relief.  -Review the attached handout on narcotic use and their risks and  side effects.   Bowel Regimen -You have been prescribed Sennakot-S to take nightly to prevent constipation especially if you are taking the narcotic pain medication intermittently.  It is important to prevent constipation and drink adequate amounts of liquids. You can stop taking this medication when you are not taking pain medication and you are back on your normal bowel routine.  Risks of Surgery Risks of surgery are low but include bleeding, infection, damage to surrounding structures, re-operation, blood clots, and very rarely death.   Blood Transfusion Information (For the consent to be signed before surgery)  We will be checking your blood type before surgery so in case of emergencies, we will know what type of blood you would need.                                            WHAT IS A BLOOD TRANSFUSION?  A transfusion is the replacement of blood or some of its parts. Blood is made up of multiple cells which provide different functions.  Red blood cells carry oxygen and are used for blood loss replacement.  White blood cells fight against infection.  Platelets control bleeding.  Plasma helps clot blood.  Other blood products are available for specialized needs, such as hemophilia or other clotting disorders. BEFORE THE TRANSFUSION  Who gives blood for transfusions?   You may be able to donate blood to be used at a later date on yourself (autologous donation).  Relatives can be asked to donate blood. This is generally not any safer than if you have received blood from a stranger. The same precautions are taken to ensure safety when a relative's blood is donated.  Healthy volunteers who are fully evaluated to make sure their blood is safe. This is blood bank blood. Transfusion therapy is the safest it has ever been in the practice of medicine. Before blood is taken from a donor, a complete history is taken to make sure that person has no history of diseases nor engages in risky  social behavior (examples are intravenous drug use or sexual activity with multiple partners). The donor's travel history is screened to minimize risk of transmitting infections, such as malaria. The donated blood is tested for signs of infectious diseases, such as HIV and hepatitis. The blood is then tested to be sure it is compatible with you in order to minimize the chance of a transfusion reaction. If you or a relative donates blood, this is often done in anticipation of surgery and is not appropriate for emergency situations. It takes many days to process the donated blood. RISKS AND COMPLICATIONS Although transfusion therapy is very safe and saves many lives, the main dangers of transfusion include:   Getting an infectious disease.  Developing a transfusion reaction. This is an allergic reaction to something in the blood you were given. Every precaution is taken  to prevent this. The decision to have a blood transfusion has been considered carefully by your caregiver before blood is given. Blood is not given unless the benefits outweigh the risks.  AFTER SURGERY INSTRUCTIONS  Return to work: 4-6 weeks if applicable  Activity: 1. Be up and out of the bed during the day.  Take a nap if needed.  You may walk up steps but be careful and use the hand rail.  Stair climbing will tire you more than you think, you may need to stop part way and rest.   2. No lifting or straining for 6 weeks over 10 pounds. No pushing, pulling, straining for 6 weeks.  3. No driving for 1 week(s).  Do not drive if you are taking narcotic pain medicine and make sure that your reaction time has returned.   4. You can shower as soon as the next day after surgery. Shower daily.  Use your regular soap and water (not directly on the incision) and pat your incision(s) dry afterwards; don't rub.  No tub baths or submerging your body in water until cleared by your surgeon. If you have the soap that was given to you by  pre-surgical testing that was used before surgery, you do not need to use it afterwards because this can irritate your incisions.   5. No sexual activity and nothing in the vagina for 8 weeks.  6. You may experience a small amount of clear drainage from your incisions, which is normal.  If the drainage persists, increases, or changes color please call the office.  7. Do not use creams, lotions, or ointments such as neosporin on your incisions after surgery until advised by your surgeon because they can cause removal of the dermabond glue on your incisions.    8. You may experience vaginal spotting after surgery or around the 6-8 week mark from surgery when the stitches at the top of the vagina begin to dissolve.  The spotting is normal but if you experience heavy bleeding, call our office.  9. Take Tylenol or ibuprofen first for pain and only use narcotic pain medication for severe pain not relieved by the Tylenol or Ibuprofen.  Monitor your Tylenol intake to a max of 4,000 mg in a 24 hour period. You can alternate these medications after surgery.  Diet: 1. Low sodium Heart Healthy Diet is recommended but you are cleared to resume your normal (before surgery) diet after your procedure.  2. It is safe to use a laxative, such as Miralax or Colace, if you have difficulty moving your bowels. You have been prescribed Sennakot at bedtime every evening to keep bowel movements regular and to prevent constipation.    Wound Care: 1. Keep clean and dry.  Shower daily.  Reasons to call the Doctor:  Fever - Oral temperature greater than 100.4 degrees Fahrenheit  Foul-smelling vaginal discharge  Difficulty urinating  Nausea and vomiting  Increased pain at the site of the incision that is unrelieved with pain medicine.  Difficulty breathing with or without chest pain  New calf pain especially if only on one side  Sudden, continuing increased vaginal bleeding with or without clots.    Contacts: For questions or concerns you should contact:  Dr. Jeral Pinch at [phone]  Joylene John, NP at [phone]  After Hours: call [phone] and have the GYN Oncologist paged/contacted

## 2020-03-21 NOTE — Patient Instructions (Addendum)
YOU ARE SCHEDULED FOR A COVID TEST _9/23________@__2 :45 pm__________. THIS TEST MUST BE DONE BEFORE SURGERY. GO TO  [place]. [place], [place], IT IS APPROXIMATELY 2 MINUTES PAST [employer] ON THE RIGHT AND REMAIN IN YOUR CAR, THIS IS A DRIVE UP TEST. ONCE YOUR COVID TEST IS DONE PLEASE FOLLOW ALL THE QUARANTINE  INSTRUCTIONS GIVEN IN YOUR HANDOUT.      Your procedure is scheduled on     Report to [facility]. [facility].   Call this number if you have problems the morning of surgery  :[phone].   OUR ADDRESS IS [place].  WE ARE LOCATED IN [facility].  PLEASE BRING YOUR INSURANCE CARD AND PHOTO ID DAY OF SURGERY.  ONLY ONE PERSON ALLOWED IN FACILITY WAITING AREA.                                     REMEMBER:  DO NOT EAT FOOD OR DRINK LIQUIDS AFTER MIDNIGHT .    YOU MAY  BRUSH YOUR TEETH MORNING OF SURGERY AND RINSE YOUR MOUTH OUT, NO CHEWING GUM CANDY OR MINTS.   TAKE THESE MEDICATIONS MORNING OF SURGERY WITH A SIP OF WATER:  _Citalopram,Prilosec  How to Manage Your Diabetes Before and After Surgery  Why is it important to control my blood sugar before and after surgery? . Improving blood sugar levels before and after surgery helps healing and can limit problems. . A way of improving blood sugar control is eating a healthy diet by: o  Eating less sugar and carbohydrates o  Increasing activity/exercise o  Talking with your doctor about reaching your blood sugar goals . High blood sugars (greater than 180 mg/dL) can raise your risk of infections and slow your recovery, so you will need to focus on controlling your diabetes during the weeks before surgery. . Make sure that the doctor who takes care of your diabetes knows about your planned surgery including the date and location.  How do I manage my blood sugar before surgery? . Check your blood sugar at least 4 times a day, starting 2 days before surgery, to  make sure that the level is not too high or low. o Check your blood sugar the morning of your surgery when you wake up and every 2 hours until you get to the Short Stay unit. . If your blood sugar is less than 70 mg/dL, you will need to treat for low blood sugar: o Do not take insulin. o Treat a low blood sugar (less than 70 mg/dL) with  cup of clear juice (cranberry or apple), 4 glucose tablets, OR glucose gel. o Recheck blood sugar in 15 minutes after treatment (to make sure it is greater than 70 mg/dL). If your blood sugar is not greater than 70 mg/dL on recheck, call [phone] for further instructions. . Report your blood sugar to the short stay nurse when you get to Short Stay.  . If you are admitted to the hospital after surgery: o Your blood sugar will be checked by the staff and you will probably be given insulin after surgery (instead of oral diabetes medicines) to make sure you have good blood sugar levels. o The goal for blood sugar control after surgery is 80-180 mg/dL.   WHAT DO I DO ABOUT MY DIABETES MEDICATION?  Marland Kitchen Do not take diabetic medication  the morning of your surgery      ONE VISITOR IS ALLOWED IN WAITING ROOM ONLY DAY OF SURGERY.    NO VISITOR MAY SPEND THE NIGHT.  VISITOR ARE ALLOWED TO STAY UNTIL 8:00 PM.                                    DO NOT WEAR JEWERLY, MAKE UP, OR NAIL POLISH ON FINGERNAILS.  DO NOT WEAR LOTIONS, POWDERS, PERFUMES OR DEODORANT.  DO NOT SHAVE FOR 24 HOURS PRIOR TO DAY OF SURGERY.  CONTACTS, GLASSES, OR DENTURES MAY NOT BE WORN TO SURGERY.                                    [facility] IS NOT RESPONSIBLE  FOR ANY BELONGINGS.                                                                    Marland Kitchen                                                                                                    [facility] - Preparing for Surgery  Before surgery, you can play an important role.   Because skin is not sterile, your skin needs to be  as free of germs as possible.   You can reduce the number of germs on your skin by washing with CHG (chlorahexidine gluconate) soap before surgery.   CHG is an antiseptic cleaner which kills germs and bonds with the skin to continue killing germs even after washing. Please DO NOT use if you have an allergy to CHG or antibacterial soaps.   If your skin becomes reddened/irritated stop using the CHG and inform your nurse when you arrive at Short Stay. Do not shave (including legs and underarms) for at least 48 hours prior to the first CHG shower.   Please follow these instructions carefully:  1.  Shower with CHG Soap the night before surgery and the  morning of Surgery.  2.  If you choose to wash your hair, wash your hair first as usual with your  normal  shampoo.  3.  After you shampoo, rinse your hair and body thoroughly to remove the  shampoo.                                        4.  Use CHG as you would any other liquid soap.  You can apply chg directly  to the skin and wash  Gently with a scrungie or clean washcloth.  5.  Apply the CHG Soap to your body ONLY FROM THE NECK DOWN.   Do not use on face/ open                           Wound or open sores. Avoid contact with eyes, ears mouth and genitals (private parts).                       Wash face,  Genitals (private parts) with your normal soap.             6.  Wash thoroughly, paying special attention to the area where your surgery  will be performed.  7.  Thoroughly rinse your body with warm water from the neck down.  8.  DO NOT shower/wash with your normal soap after using and rinsing off  the CHG Soap.             9.  Pat yourself dry with a clean towel.            10.  Wear clean pajamas.            11.  Place clean sheets on your bed the night of your first shower and do not  sleep with pets. Day of Surgery : Do not apply any lotions/deodorants the morning of surgery.  Please wear clean clothes to the  hospital/surgery center.  FAILURE TO FOLLOW THESE INSTRUCTIONS MAY RESULT IN THE CANCELLATION OF YOUR SURGERY PATIENT SIGNATURE_________________________________  NURSE SIGNATURE__________________________________  ________________________________________________________________________

## 2020-03-21 NOTE — Progress Notes (Signed)
Called [facility] on [address]. Oxycodone 5 mg tabs prescription was on hold due to not in stock.   The pharmacy has it in stock now and will fill the prescription and call the patient. Information given to Joylene John, NP.

## 2020-03-22 ENCOUNTER — Encounter (HOSPITAL_COMMUNITY): Payer: Self-pay

## 2020-03-22 ENCOUNTER — Encounter (HOSPITAL_COMMUNITY)
Admission: RE | Admit: 2020-03-22 | Discharge: 2020-03-22 | Disposition: A | Discharge status: Home / Self Care | Payer: 59 | Source: Ambulatory Visit | Attending: Gynecologic Oncology | Admitting: Gynecologic Oncology

## 2020-03-22 ENCOUNTER — Other Ambulatory Visit: Payer: Self-pay

## 2020-03-22 DIAGNOSIS — Z01812 Encounter for preprocedural laboratory examination: Secondary | ICD-10-CM | POA: Diagnosis not present

## 2020-03-22 HISTORY — DX: Gastro-esophageal reflux disease without esophagitis: K21.9

## 2020-03-22 HISTORY — DX: Headache, unspecified: R51.9

## 2020-03-22 LAB — COMPREHENSIVE METABOLIC PANEL
ALT: 16 U/L (ref 0–44)
AST: 19 U/L (ref 15–41)
Albumin: 3.6 g/dL (ref 3.5–5.0)
Alkaline Phosphatase: 44 U/L (ref 38–126)
Anion gap: 11 (ref 5–15)
BUN: 11 mg/dL (ref 6–20)
CO2: 24 mmol/L (ref 22–32)
Calcium: 8.7 mg/dL — ABNORMAL LOW (ref 8.9–10.3)
Chloride: 102 mmol/L (ref 98–111)
Creatinine, Ser: 0.89 mg/dL (ref 0.44–1.00)
GFR calc Af Amer: >60 mL/min (ref >60)
GFR calc non Af Amer: >60 mL/min (ref >60)
Glucose, Bld: 178 mg/dL — ABNORMAL HIGH (ref 70–99)
Potassium: 3.3 mmol/L — ABNORMAL LOW (ref 3.5–5.1)
Sodium: 137 mmol/L (ref 135–145)
Total Bilirubin: 0.7 mg/dL (ref 0.3–1.2)
Total Protein: 7.4 g/dL (ref 6.5–8.1)

## 2020-03-22 LAB — CBC
HCT: 38.4 % (ref 36.0–46.0)
Hemoglobin: 12 g/dL (ref 12.0–15.0)
MCH: 26.1 pg (ref 26.0–34.0)
MCHC: 31.3 g/dL (ref 30.0–36.0)
MCV: 83.5 fL (ref 80.0–100.0)
Platelets: 234 10*3/uL (ref 150–400)
RBC: 4.6 MIL/uL (ref 3.87–5.11)
RDW: 14.6 % (ref 11.5–15.5)
WBC: 6.2 10*3/uL (ref 4.0–10.5)
nRBC: 0 % (ref 0.0–0.2)

## 2020-03-22 LAB — URINALYSIS, ROUTINE W REFLEX MICROSCOPIC
Bacteria, UA: NONE SEEN
Bilirubin Urine: NEGATIVE
Glucose, UA: NEGATIVE mg/dL
Hgb urine dipstick: NEGATIVE
Ketones, ur: NEGATIVE mg/dL
Nitrite: NEGATIVE
Protein, ur: NEGATIVE mg/dL
Specific Gravity, Urine: 1.012 (ref 1.005–1.030)
pH: 7 (ref 5.0–8.0)

## 2020-03-22 LAB — GLUCOSE, CAPILLARY: Glucose-Capillary: 183 mg/dL — ABNORMAL HIGH (ref 70–99)

## 2020-03-22 LAB — HEMOGLOBIN A1C
Hgb A1c MFr Bld: 6.9 % — ABNORMAL HIGH (ref 4.8–5.6)
Mean Plasma Glucose: 151.33 mg/dL

## 2020-03-22 NOTE — Progress Notes (Signed)
COVID Vaccine Completed:Yes Date COVID Vaccine completed:09/26/19 COVID vaccine manufacturer:   Moderna    PCP - [name] FNP Cardiologist - no  Chest x-ray - no EKG - 02/07/20-Epic Stress Test - no ECHO - no Cardiac Cath - no Pacemaker/ICD device last checked:NA  Sleep Study - no CPAP -   Fasting Blood Sugar - Pt doesn't test Checks Blood Sugar _____ times a day  Blood Thinner Instructions:NA Aspirin Instructions: Last Dose:  Anesthesia review:   Patient denies shortness of breath, fever, cough and chest pain at PAT appointment Yes  Patient verbalized understanding of instructions that were given to them at the PAT appointment. Patient was also instructed that they will need to review over the PAT instructions again at home before surgery. Yes Pt reports no SOB with stairs , doing housework or with ADLs

## 2020-03-23 ENCOUNTER — Other Ambulatory Visit (HOSPITAL_COMMUNITY)
Admission: RE | Admit: 2020-03-23 | Discharge: 2020-03-23 | Disposition: A | Discharge status: Home / Self Care | Payer: 59 | Source: Ambulatory Visit | Attending: Gynecologic Oncology | Admitting: Gynecologic Oncology

## 2020-03-23 DIAGNOSIS — Z01812 Encounter for preprocedural laboratory examination: Secondary | ICD-10-CM | POA: Insufficient documentation

## 2020-03-23 DIAGNOSIS — Z20822 Contact with and (suspected) exposure to covid-19: Secondary | ICD-10-CM | POA: Insufficient documentation

## 2020-03-23 LAB — SARS CORONAVIRUS 2 (TAT 6-24 HRS): SARS Coronavirus 2: NEGATIVE

## 2020-03-24 ENCOUNTER — Telehealth: Payer: Self-pay

## 2020-03-24 NOTE — Telephone Encounter (Signed)
Sent SMS notification to patient since voice mail is full. Sent phone number [phone] to call back.  Attempting to reach patient to discuss pre op instructions and see if she has any questions regarding the written instructions.

## 2020-03-24 NOTE — Telephone Encounter (Signed)
Attempting to reach patient to discuss pre op instructions and see if she has any questions regarding the written instructions. Not able to leave a message. The voice mail was full.

## 2020-03-26 NOTE — Anesthesia Preprocedure Evaluation (Addendum)
Anesthesia Evaluation  Patient identified by MRN, date of birth, ID band Patient awake    Reviewed: Allergy & Precautions, NPO status , Patient's Chart, lab work & pertinent test results  Airway Mallampati: I  TM Distance: >3 FB Neck ROM: Full    Dental no notable dental hx. (+) Teeth Intact, Poor Dentition, Missing,    Pulmonary neg pulmonary ROS, former smoker,    Pulmonary exam normal breath sounds clear to auscultation       Cardiovascular hypertension, Pt. on medications Normal cardiovascular exam Rhythm:Regular Rate:Normal     Neuro/Psych  Headaches, Anxiety Depression    GI/Hepatic Neg liver ROS, GERD  Medicated,  Endo/Other  diabetes, Type 2, Oral Hypoglycemic AgentsMorbid obesity  Renal/GU negative Renal ROS  negative genitourinary   Musculoskeletal negative musculoskeletal ROS (+)   Abdominal   Peds negative pediatric ROS (+)  Hematology negative hematology ROS (+)   Anesthesia Other Findings   Reproductive/Obstetrics negative OB ROS                            Anesthesia Physical  Anesthesia Plan  ASA: III  Anesthesia Plan: General   Post-op Pain Management:    Induction: Intravenous  PONV Risk Score and Plan: 3 and Midazolam, Dexamethasone, Ondansetron and Treatment may vary due to age or medical condition  Airway Management Planned: Oral ETT  Additional Equipment: None  Intra-op Plan:   Post-operative Plan: Extubation in OR  Informed Consent: I have reviewed the patients History and Physical, chart, labs and discussed the procedure including the risks, benefits and alternatives for the proposed anesthesia with the patient or authorized representative who has indicated his/her understanding and acceptance.     Dental advisory given  Plan Discussed with: CRNA and Anesthesiologist  Anesthesia Plan Comments:         Anesthesia Quick Evaluation

## 2020-03-27 ENCOUNTER — Encounter (HOSPITAL_BASED_OUTPATIENT_CLINIC_OR_DEPARTMENT_OTHER): Payer: Self-pay | Admitting: Gynecologic Oncology

## 2020-03-27 ENCOUNTER — Other Ambulatory Visit: Payer: Self-pay

## 2020-03-27 ENCOUNTER — Ambulatory Visit (HOSPITAL_BASED_OUTPATIENT_CLINIC_OR_DEPARTMENT_OTHER): Payer: 59 | Admitting: Anesthesiology

## 2020-03-27 ENCOUNTER — Ambulatory Visit (HOSPITAL_BASED_OUTPATIENT_CLINIC_OR_DEPARTMENT_OTHER)
Admission: RE | Admit: 2020-03-27 | Discharge: 2020-03-27 | Disposition: A | Discharge status: Home / Self Care | Payer: 59 | Attending: Gynecologic Oncology | Admitting: Gynecologic Oncology

## 2020-03-27 ENCOUNTER — Encounter (HOSPITAL_BASED_OUTPATIENT_CLINIC_OR_DEPARTMENT_OTHER)
Admission: RE | Disposition: A | Discharge status: Home / Self Care | Payer: Self-pay | Source: Home / Self Care | Attending: Gynecologic Oncology

## 2020-03-27 DIAGNOSIS — Z7984 Long term (current) use of oral hypoglycemic drugs: Secondary | ICD-10-CM | POA: Insufficient documentation

## 2020-03-27 DIAGNOSIS — N888 Other specified noninflammatory disorders of cervix uteri: Secondary | ICD-10-CM | POA: Diagnosis not present

## 2020-03-27 DIAGNOSIS — N83292 Other ovarian cyst, left side: Secondary | ICD-10-CM | POA: Insufficient documentation

## 2020-03-27 DIAGNOSIS — Z79899 Other long term (current) drug therapy: Secondary | ICD-10-CM | POA: Diagnosis not present

## 2020-03-27 DIAGNOSIS — Z8249 Family history of ischemic heart disease and other diseases of the circulatory system: Secondary | ICD-10-CM | POA: Diagnosis not present

## 2020-03-27 DIAGNOSIS — Z888 Allergy status to other drugs, medicaments and biological substances status: Secondary | ICD-10-CM | POA: Diagnosis not present

## 2020-03-27 DIAGNOSIS — N83202 Unspecified ovarian cyst, left side: Secondary | ICD-10-CM | POA: Diagnosis not present

## 2020-03-27 DIAGNOSIS — E785 Hyperlipidemia, unspecified: Secondary | ICD-10-CM | POA: Insufficient documentation

## 2020-03-27 DIAGNOSIS — N800 Endometriosis of uterus: Secondary | ICD-10-CM | POA: Insufficient documentation

## 2020-03-27 DIAGNOSIS — E119 Type 2 diabetes mellitus without complications: Secondary | ICD-10-CM | POA: Diagnosis not present

## 2020-03-27 DIAGNOSIS — Z833 Family history of diabetes mellitus: Secondary | ICD-10-CM | POA: Insufficient documentation

## 2020-03-27 DIAGNOSIS — N8500 Endometrial hyperplasia, unspecified: Secondary | ICD-10-CM | POA: Diagnosis not present

## 2020-03-27 DIAGNOSIS — Z7983 Long term (current) use of bisphosphonates: Secondary | ICD-10-CM | POA: Diagnosis not present

## 2020-03-27 DIAGNOSIS — N83201 Unspecified ovarian cyst, right side: Secondary | ICD-10-CM | POA: Diagnosis not present

## 2020-03-27 DIAGNOSIS — I10 Essential (primary) hypertension: Secondary | ICD-10-CM | POA: Insufficient documentation

## 2020-03-27 DIAGNOSIS — N83291 Other ovarian cyst, right side: Secondary | ICD-10-CM | POA: Diagnosis not present

## 2020-03-27 DIAGNOSIS — N72 Inflammatory disease of cervix uteri: Secondary | ICD-10-CM | POA: Insufficient documentation

## 2020-03-27 DIAGNOSIS — D251 Intramural leiomyoma of uterus: Secondary | ICD-10-CM | POA: Diagnosis not present

## 2020-03-27 DIAGNOSIS — N8502 Endometrial intraepithelial neoplasia [EIN]: Secondary | ICD-10-CM | POA: Diagnosis not present

## 2020-03-27 DIAGNOSIS — F329 Major depressive disorder, single episode, unspecified: Secondary | ICD-10-CM | POA: Diagnosis not present

## 2020-03-27 HISTORY — PX: ROBOTIC ASSISTED TOTAL HYSTERECTOMY WITH BILATERAL SALPINGO OOPHERECTOMY: SHX6086

## 2020-03-27 LAB — ABO/RH: ABO/RH(D): A POS

## 2020-03-27 LAB — TYPE AND SCREEN
ABO/RH(D): A POS
Antibody Screen: NEGATIVE

## 2020-03-27 LAB — GLUCOSE, CAPILLARY
Glucose-Capillary: 150 mg/dL — ABNORMAL HIGH (ref 70–99)
Glucose-Capillary: 174 mg/dL — ABNORMAL HIGH (ref 70–99)

## 2020-03-27 SURGERY — HYSTERECTOMY, TOTAL, ROBOT-ASSISTED, LAPAROSCOPIC, WITH BILATERAL SALPINGO-OOPHORECTOMY
Anesthesia: General | Site: Abdomen

## 2020-03-27 MED ORDER — ONDANSETRON HCL 4 MG/2ML IJ SOLN
INTRAMUSCULAR | Status: DC | PRN
  Administered 2020-03-27: 4 mg via INTRAVENOUS

## 2020-03-27 MED ORDER — BUPIVACAINE LIPOSOME 1.3 % IJ SUSP: INTRAMUSCULAR | Status: DC | PRN

## 2020-03-27 MED ORDER — ONDANSETRON HCL 4 MG/2ML IJ SOLN
INTRAMUSCULAR | Status: AC
  Filled 2020-03-27: qty 2

## 2020-03-27 MED ORDER — HEPARIN SODIUM (PORCINE) 5000 UNIT/ML IJ SOLN
5000.0000 [IU] | INTRAMUSCULAR | Status: AC
  Administered 2020-03-27: 5000 [IU] via SUBCUTANEOUS

## 2020-03-27 MED ORDER — BUPIVACAINE HCL 0.25 % IJ SOLN
INTRAMUSCULAR | Status: DC | PRN
  Administered 2020-03-27: 20 mL

## 2020-03-27 MED ORDER — SUGAMMADEX SODIUM 200 MG/2ML IV SOLN
INTRAVENOUS | Status: DC | PRN
  Administered 2020-03-27: 400 mg via INTRAVENOUS

## 2020-03-27 MED ORDER — SCOPOLAMINE 1 MG/3DAYS TD PT72
MEDICATED_PATCH | TRANSDERMAL | Status: AC
  Filled 2020-03-27: qty 1

## 2020-03-27 MED ORDER — ACETAMINOPHEN 325 MG PO TABS: 325.0000 mg | ORAL_TABLET | ORAL | Status: DC | PRN

## 2020-03-27 MED ORDER — ACETAMINOPHEN 500 MG PO TABS
ORAL_TABLET | ORAL | Status: AC
  Filled 2020-03-27: qty 2

## 2020-03-27 MED ORDER — DEXAMETHASONE SODIUM PHOSPHATE 10 MG/ML IJ SOLN
INTRAMUSCULAR | Status: DC | PRN
  Administered 2020-03-27: 8 mg via INTRAVENOUS

## 2020-03-27 MED ORDER — ONDANSETRON HCL 4 MG/2ML IJ SOLN: 4.0000 mg | Freq: Four times a day (QID) | INTRAMUSCULAR | Status: DC | PRN

## 2020-03-27 MED ORDER — ONDANSETRON HCL 4 MG/2ML IJ SOLN: 4.0000 mg | Freq: Once | INTRAMUSCULAR | Status: DC | PRN

## 2020-03-27 MED ORDER — FENTANYL CITRATE (PF) 250 MCG/5ML IJ SOLN
INTRAMUSCULAR | Status: AC
  Filled 2020-03-27: qty 5

## 2020-03-27 MED ORDER — ACETAMINOPHEN 160 MG/5ML PO SOLN: 325.0000 mg | ORAL | Status: DC | PRN

## 2020-03-27 MED ORDER — GABAPENTIN 300 MG PO CAPS
300.0000 mg | ORAL_CAPSULE | ORAL | Status: AC
  Administered 2020-03-27: 300 mg via ORAL

## 2020-03-27 MED ORDER — DEXAMETHASONE SODIUM PHOSPHATE 10 MG/ML IJ SOLN
INTRAMUSCULAR | Status: AC
  Filled 2020-03-27: qty 1

## 2020-03-27 MED ORDER — DEXAMETHASONE SODIUM PHOSPHATE 10 MG/ML IJ SOLN: 4.0000 mg | INTRAMUSCULAR | Status: DC

## 2020-03-27 MED ORDER — CELECOXIB 200 MG PO CAPS
ORAL_CAPSULE | ORAL | Status: AC
  Filled 2020-03-27: qty 2

## 2020-03-27 MED ORDER — MEPERIDINE HCL 25 MG/ML IJ SOLN: 6.2500 mg | INTRAMUSCULAR | Status: DC | PRN

## 2020-03-27 MED ORDER — LIDOCAINE 2% (20 MG/ML) 5 ML SYRINGE
INTRAMUSCULAR | Status: AC
  Filled 2020-03-27: qty 10

## 2020-03-27 MED ORDER — MIDAZOLAM HCL 2 MG/2ML IJ SOLN
INTRAMUSCULAR | Status: AC
  Filled 2020-03-27: qty 2

## 2020-03-27 MED ORDER — FENTANYL CITRATE (PF) 100 MCG/2ML IJ SOLN: 25.0000 ug | INTRAMUSCULAR | Status: DC | PRN

## 2020-03-27 MED ORDER — CEFAZOLIN SODIUM-DEXTROSE 2-4 GM/100ML-% IV SOLN
INTRAVENOUS | Status: AC
  Filled 2020-03-27: qty 100

## 2020-03-27 MED ORDER — KETOROLAC TROMETHAMINE 30 MG/ML IJ SOLN
INTRAMUSCULAR | Status: AC
  Filled 2020-03-27: qty 1

## 2020-03-27 MED ORDER — ROCURONIUM BROMIDE 10 MG/ML (PF) SYRINGE
PREFILLED_SYRINGE | INTRAVENOUS | Status: AC
  Filled 2020-03-27: qty 10

## 2020-03-27 MED ORDER — KETAMINE HCL 10 MG/ML IJ SOLN
INTRAMUSCULAR | Status: DC | PRN
  Administered 2020-03-27: 30 mg via INTRAVENOUS

## 2020-03-27 MED ORDER — LIDOCAINE 2% (20 MG/ML) 5 ML SYRINGE
INTRAMUSCULAR | Status: AC
  Filled 2020-03-27: qty 5

## 2020-03-27 MED ORDER — OXYCODONE HCL 5 MG PO TABS
5.0000 mg | ORAL_TABLET | Freq: Once | ORAL | Status: AC | PRN
  Administered 2020-03-27: 5 mg via ORAL

## 2020-03-27 MED ORDER — CELECOXIB 200 MG PO CAPS
400.0000 mg | ORAL_CAPSULE | ORAL | Status: AC
  Administered 2020-03-27: 400 mg via ORAL

## 2020-03-27 MED ORDER — SCOPOLAMINE 1 MG/3DAYS TD PT72
1.0000 | MEDICATED_PATCH | TRANSDERMAL | Status: DC
  Administered 2020-03-27: 1.5 mg via TRANSDERMAL

## 2020-03-27 MED ORDER — GABAPENTIN 300 MG PO CAPS
ORAL_CAPSULE | ORAL | Status: AC
  Filled 2020-03-27: qty 1

## 2020-03-27 MED ORDER — FENTANYL CITRATE (PF) 100 MCG/2ML IJ SOLN
INTRAMUSCULAR | Status: DC | PRN
Start: 2020-03-27 — End: 2020-03-27
  Administered 2020-03-27 (×3): 50 ug via INTRAVENOUS
  Administered 2020-03-27: 100 ug via INTRAVENOUS

## 2020-03-27 MED ORDER — CEFAZOLIN SODIUM-DEXTROSE 2-4 GM/100ML-% IV SOLN
2.0000 g | INTRAVENOUS | Status: AC
  Administered 2020-03-27: 2 g via INTRAVENOUS

## 2020-03-27 MED ORDER — LACTATED RINGERS IV SOLN: INTRAVENOUS | Status: DC

## 2020-03-27 MED ORDER — OXYCODONE HCL 5 MG PO TABS: 5.0000 mg | ORAL_TABLET | ORAL | Status: DC | PRN

## 2020-03-27 MED ORDER — SODIUM CHLORIDE (PF) 0.9 % IJ SOLN
INTRAMUSCULAR | Status: AC
  Filled 2020-03-27: qty 10

## 2020-03-27 MED ORDER — PROPOFOL 10 MG/ML IV BOLUS
INTRAVENOUS | Status: DC | PRN
  Administered 2020-03-27: 150 mg via INTRAVENOUS

## 2020-03-27 MED ORDER — LIDOCAINE 2% (20 MG/ML) 5 ML SYRINGE
INTRAMUSCULAR | Status: DC | PRN
  Administered 2020-03-27: 100 mg via INTRAVENOUS

## 2020-03-27 MED ORDER — OXYCODONE HCL 5 MG/5ML PO SOLN: 5.0000 mg | Freq: Once | ORAL | Status: AC | PRN

## 2020-03-27 MED ORDER — KETAMINE HCL 10 MG/ML IJ SOLN
INTRAMUSCULAR | Status: AC
  Filled 2020-03-27: qty 1

## 2020-03-27 MED ORDER — PROPOFOL 10 MG/ML IV BOLUS
INTRAVENOUS | Status: AC
  Filled 2020-03-27: qty 40

## 2020-03-27 MED ORDER — TRAMADOL HCL 50 MG PO TABS: 50.0000 mg | ORAL_TABLET | Freq: Four times a day (QID) | ORAL | Status: DC | PRN

## 2020-03-27 MED ORDER — ACETAMINOPHEN 500 MG PO TABS
1000.0000 mg | ORAL_TABLET | ORAL | Status: AC
  Administered 2020-03-27: 1000 mg via ORAL

## 2020-03-27 MED ORDER — HEPARIN SODIUM (PORCINE) 5000 UNIT/ML IJ SOLN
INTRAMUSCULAR | Status: AC
  Filled 2020-03-27: qty 1

## 2020-03-27 MED ORDER — SODIUM CHLORIDE 0.9 % IR SOLN
Status: DC | PRN
  Administered 2020-03-27: 3000 mL

## 2020-03-27 MED ORDER — ONDANSETRON HCL 4 MG PO TABS: 4.0000 mg | ORAL_TABLET | Freq: Four times a day (QID) | ORAL | Status: DC | PRN

## 2020-03-27 MED ORDER — OXYCODONE HCL 5 MG PO TABS
ORAL_TABLET | ORAL | Status: AC
  Filled 2020-03-27: qty 1

## 2020-03-27 MED ORDER — MIDAZOLAM HCL 2 MG/2ML IJ SOLN
INTRAMUSCULAR | Status: DC | PRN
  Administered 2020-03-27: 2 mg via INTRAVENOUS

## 2020-03-27 MED ORDER — ARTIFICIAL TEARS OPHTHALMIC OINT
TOPICAL_OINTMENT | OPHTHALMIC | Status: AC
  Filled 2020-03-27: qty 3.5

## 2020-03-27 MED ORDER — HYDROMORPHONE HCL 1 MG/ML IJ SOLN: 0.2000 mg | INTRAMUSCULAR | Status: DC | PRN

## 2020-03-27 MED ORDER — ROCURONIUM BROMIDE 10 MG/ML (PF) SYRINGE
PREFILLED_SYRINGE | INTRAVENOUS | Status: DC | PRN
  Administered 2020-03-27: 20 mg via INTRAVENOUS
  Administered 2020-03-27: 60 mg via INTRAVENOUS

## 2020-03-27 MED ORDER — LIDOCAINE 2% (20 MG/ML) 5 ML SYRINGE
INTRAMUSCULAR | Status: DC | PRN
  Administered 2020-03-27: 1.5 mg/kg/h via INTRAVENOUS

## 2020-03-27 SURGICAL SUPPLY — 54 items
ADH SKN CLS APL DERMABOND .7 (GAUZE/BANDAGES/DRESSINGS) ×2
APL PRP STRL LF DISP 70% ISPRP (MISCELLANEOUS) ×2
APL SWBSTK 6 STRL LF DISP (MISCELLANEOUS) ×2
APPLICATOR COTTON TIP 6 STRL (MISCELLANEOUS) ×2 IMPLANT
APPLICATOR COTTON TIP 6IN STRL (MISCELLANEOUS) ×4
CHLORAPREP W/TINT 26 (MISCELLANEOUS) ×4 IMPLANT
COVER BACK TABLE 60X90IN (DRAPES) ×4 IMPLANT
COVER TIP SHEARS 8 DVNC (MISCELLANEOUS) ×2 IMPLANT
COVER TIP SHEARS 8MM DA VINCI (MISCELLANEOUS) ×4
COVER WAND RF STERILE (DRAPES) ×4 IMPLANT
DECANTER SPIKE VIAL GLASS SM (MISCELLANEOUS) ×4 IMPLANT
DERMABOND ADVANCED (GAUZE/BANDAGES/DRESSINGS) ×2
DERMABOND ADVANCED .7 DNX12 (GAUZE/BANDAGES/DRESSINGS) ×2 IMPLANT
DRAPE ARM DVNC X/XI (DISPOSABLE) ×8 IMPLANT
DRAPE COLUMN DVNC XI (DISPOSABLE) ×2 IMPLANT
DRAPE DA VINCI XI ARM (DISPOSABLE) ×16
DRAPE DA VINCI XI COLUMN (DISPOSABLE) ×4
DRAPE SHEET LG 3/4 BI-LAMINATE (DRAPES) ×4 IMPLANT
DRAPE SURG IRRIG POUCH 19X23 (DRAPES) ×4 IMPLANT
ELECT REM PT RETURN 9FT ADLT (ELECTROSURGICAL) ×4
ELECTRODE REM PT RTRN 9FT ADLT (ELECTROSURGICAL) ×2 IMPLANT
GAUZE 4X4 16PLY RFD (DISPOSABLE) ×4 IMPLANT
GLOVE BIO SURGEON STRL SZ 6 (GLOVE) ×16 IMPLANT
GLOVE BIO SURGEON STRL SZ 6.5 (GLOVE) ×6 IMPLANT
GLOVE BIO SURGEON STRL SZ7 (GLOVE) ×12 IMPLANT
GLOVE BIO SURGEONS STRL SZ 6.5 (GLOVE) ×2
GLOVE BIOGEL PI IND STRL 7.0 (GLOVE) ×6 IMPLANT
GLOVE BIOGEL PI IND STRL 7.5 (GLOVE) ×4 IMPLANT
GLOVE BIOGEL PI INDICATOR 7.0 (GLOVE) ×6
GLOVE BIOGEL PI INDICATOR 7.5 (GLOVE) ×4
HOLDER FOLEY CATH W/STRAP (MISCELLANEOUS) ×4 IMPLANT
IRRIG SUCT STRYKERFLOW 2 WTIP (MISCELLANEOUS) ×4
IRRIGATION SUCT STRKRFLW 2 WTP (MISCELLANEOUS) ×2 IMPLANT
KIT TURNOVER CYSTO (KITS) ×4 IMPLANT
LEGGING LITHOTOMY PAIR STRL (DRAPES) ×4 IMPLANT
MANIFOLD NEPTUNE II (INSTRUMENTS) ×4 IMPLANT
MANIPULATOR UTERINE 4.5 ZUMI (MISCELLANEOUS) ×4 IMPLANT
NEEDLE HYPO 22GX1.5 SAFETY (NEEDLE) ×4 IMPLANT
NS IRRIG 500ML POUR BTL (IV SOLUTION) ×4 IMPLANT
OBTURATOR OPTICAL STANDARD 8MM (TROCAR) ×4
OBTURATOR OPTICAL STND 8 DVNC (TROCAR) ×2
OBTURATOR OPTICALSTD 8 DVNC (TROCAR) ×2 IMPLANT
PACK ROBOT GYN CUSTOM WL (TRAY / TRAY PROCEDURE) ×4 IMPLANT
PACK ROBOTIC GOWN (GOWN DISPOSABLE) ×4 IMPLANT
PAD POSITIONING PINK XL (MISCELLANEOUS) ×4 IMPLANT
PORT ACCESS TROCAR AIRSEAL 12 (TROCAR) ×2 IMPLANT
PORT ACCESS TROCAR AIRSEAL 5M (TROCAR) ×2
SEAL CANN UNIV 5-8 DVNC XI (MISCELLANEOUS) ×6 IMPLANT
SEAL XI 5MM-8MM UNIVERSAL (MISCELLANEOUS) ×12
SET TRI-LUMEN FLTR TB AIRSEAL (TUBING) ×4 IMPLANT
SUT VIC AB 4-0 PS2 18 (SUTURE) ×8 IMPLANT
SYR CONTROL 10ML LL (SYRINGE) ×8 IMPLANT
TRAY FOLEY W/BAG SLVR 14FR LF (SET/KITS/TRAYS/PACK) ×4 IMPLANT
UNDERPAD 30X36 HEAVY ABSORB (UNDERPADS AND DIAPERS) ×4 IMPLANT

## 2020-03-27 NOTE — Anesthesia Procedure Notes (Addendum)
Procedure Name: Intubation Date/Time: 03/27/2020 9:44 AM Performed by: Bonney Aid, CRNA Pre-anesthesia Checklist: Patient identified, Emergency Drugs available, Suction available and Patient being monitored Patient Re-evaluated:Patient Re-evaluated prior to induction Oxygen Delivery Method: Circle system utilized Preoxygenation: Pre-oxygenation with 100% oxygen Induction Type: IV induction Ventilation: Mask ventilation without difficulty Laryngoscope Size: Mac and 3 Grade View: Grade I Tube type: Oral Tube size: 7.0 mm Number of attempts: 1 Airway Equipment and Method: Stylet and Oral airway Placement Confirmation: ETT inserted through vocal cords under direct vision,  positive ETCO2 and breath sounds checked- equal and bilateral Secured at: 21 cm Tube secured with: Tape Dental Injury: Teeth and Oropharynx as per pre-operative assessment

## 2020-03-27 NOTE — Anesthesia Postprocedure Evaluation (Signed)
Anesthesia Post Note  Patient: Danielle Calderon  Procedure(s) Performed: XI ROBOTIC ASSISTED TOTAL HYSTERECTOMY WITH BILATERAL SALPINGO OOPHORECTOMY (N/A Abdomen)     Patient location during evaluation: PACU Anesthesia Type: General Level of consciousness: awake and alert Pain management: pain level controlled Vital Signs Assessment: post-procedure vital signs reviewed and stable Respiratory status: spontaneous breathing, nonlabored ventilation, respiratory function stable and patient connected to nasal cannula oxygen Cardiovascular status: blood pressure returned to baseline and stable Postop Assessment: no apparent nausea or vomiting Anesthetic complications: no   No complications documented.  Last Vitals:  Vitals:   03/27/20 1200 03/27/20 1215  BP: (!) 145/95 138/89  Pulse: 73 74  Resp: 12 19  Temp:    SpO2: 97% 93%    Last Pain:  Vitals:   03/27/20 1215  TempSrc:   PainSc: 4                  Jaegar Croft

## 2020-03-27 NOTE — Op Note (Signed)
OPERATIVE NOTE  Pre-operative Diagnosis: Focal complexa atypical hyperplasia  Post-operative Diagnosis: same, no gross lesion noted on hysterectomy specimen  Operation: Robotic-assisted laparoscopic total hysterectomy with bilateral salpingoophorectomy  Surgeon: Jeral Pinch MD  Assistant Surgeon: Joylene John NP  Anesthesia: GET  Urine Output: 425cc  Operative Findings: Small mobile uterus on EUA. On intra-abdominal entry, normal upper abdominal survey. Normal appearing small and large bowel, appendix, and omentum. On of sigmoid epiploica adherent to the bladder peritoneum on the left. Uterus 8-10cm. Normal appearing and atrophic ovaries, tubes. No intra-abdominal or pelvic evidence of disease. IOFS revealed no gross lesion.  Estimated Blood Loss:  less than 50 mL      Total IV Fluids: see I&O flowsheet         Specimens: uterus, cervix, bilateral tubes and ovaries         Complications:  None apparent; patient tolerated the procedure well.         Disposition: PACU - hemodynamically stable.  Procedure Details  The patient was seen in the Holding Room. The risks, benefits, complications, treatment options, and expected outcomes were discussed with the patient.  The patient concurred with the proposed plan, giving informed consent.  The site of surgery properly noted/marked. The patient was identified as Danielle Calderon and the procedure verified as a Robotic-assisted hysterectomy with bilateral salpingo oophorectomy.   After induction of anesthesia, the patient was draped and prepped in the usual sterile manner. Patient was placed in supine position after anesthesia and draped and prepped in the usual sterile manner as follows: Her arms were tucked to her side with all appropriate precautions.  The shoulders were stabilized with padded shoulder blocks applied to the acromium processes.  The patient was placed in the semi-lithotomy position in [place].  The perineum and vagina  were prepped with CholoraPrep. The patient was draped after the CholoraPrep had been allowed to dry for 3 minutes.  A Time Out was held and the above information confirmed.  The urethra was prepped with Betadine. Foley catheter was placed.  A sterile speculum was placed in the vagina.  The cervix was grasped with a single-tooth tenaculum. The cervix was dilated with Kennon Rounds dilators.  The ZUMI uterine manipulator with a medium colpotomizer ring was placed without difficulty.  A pneum occluder balloon was placed over the manipulator.  OG tube placement was confirmed and to suction.   Next, a 10 mm skin incision was made 1 cm below the subcostal margin in the midclavicular line.  The 5 mm Optiview port and scope was used for direct entry.  Opening pressure was under 10 mm CO2.  The abdomen was insufflated and the findings were noted as above.   At this point and all points during the procedure, the patient's intra-abdominal pressure did not exceed 15 mmHg. Next, an 8 mm skin incision was made superior to the umbilicus and a right and left port were placed about 8 cm lateral to the robot port on the right and left side.  A fourth arm was placed on the right.  The 5 mm assist trocar was exchanged for a 10-12 mm port. All ports were placed under direct visualization.  The patient was placed in steep Trendelenburg.  Bowel was folded away into the upper abdomen.  The robot was docked in the normal manner.  The right and left peritoneum were opened parallel to the IP ligament to open the retroperitoneal spaces bilaterally. The round ligaments were transected. The ureter was noted to  be on the medial leaf of the broad ligament.  The peritoneum above the ureter was incised and stretched and the infundibulopelvic ligament was skeletonized, cauterized and cut.    The posterior peritoneum was taken down to the level of the KOH ring.  The anterior peritoneum was also taken down.  The bladder flap was created to the level of  the KOH ring.  The uterine artery on the right side was skeletonized, cauterized and cut in the normal manner.  A similar procedure was performed on the left.  The colpotomy was made and the uterus, cervix, bilateral ovaries and tubes were amputated and delivered through the vagina. The specimen was sent to pathology for frozen section. Pedicles were inspected and excellent hemostasis was achieved.    The colpotomy at the vaginal cuff was closed with Vicryl on a CT1 needle in a running manner.  Irrigation was used and excellent hemostasis was achieved.  At this point in the procedure was completed.  Robotic instruments were removed under direct visulaization.  The robot was undocked. The fascia at the 10-12 mm port was closed with 0 Vicryl on a UR-5 needle.  The subcuticular tissue was closed with 4-0 Vicryl and the skin was closed with 4-0 Monocryl in a subcuticular manner.  Dermabond was applied.    The vagina was swabbed with minimal bleeding noted.   All sponge, lap and needle counts were correct x  3. Foley catheter was removed.  The patient was transferred to the recovery room in stable condition.  Jeral Pinch, MD

## 2020-03-27 NOTE — Discharge Instructions (Signed)
03/27/2020  Return to work: 4-6 weeks if applicable  Activity: 1. Be up and out of the bed during the day.  Take a nap if needed.  You may walk up steps but be careful and use the hand rail.  Stair climbing will tire you more than you think, you may need to stop part way and rest.   2. No lifting or straining for 6 weeks.  3. No driving for 1 week(s).  Do not drive if you are taking narcotic pain medicine.  4. Shower daily.  Use soap and water on your incision and pat dry; don't rub.  No tub baths until cleared by your surgeon.   5. No sexual activity and nothing in the vagina for 8 weeks.  6. You may experience a small amount of clear drainage from your incisions, which is normal.  If the drainage persists or increases, please call the office.  7. You may experience vaginal spotting after surgery or around the 6-8 week mark from surgery when the stitches at the top of the vagina begin to dissolve.  The spotting is normal but if you experience heavy bleeding, call our office.  8. Take Tylenol or ibuprofen first for pain and only use narcotic pain medication for severe pain not relieved by the Tylenol or Ibuprofen.  Monitor your Tylenol intake to a max of 4,000 mg.  Diet: 1. Low sodium Heart Healthy Diet is recommended.  2. It is safe to use a laxative, such as Miralax or Colace, if you have difficulty moving your bowels. You can take Sennakot at bedtime every evening to keep bowel movements regular and to prevent constipation.    Wound Care: 1. Keep clean and dry.  Shower daily.  Reasons to call the Doctor:  Fever - Oral temperature greater than 100.4 degrees Fahrenheit  Foul-smelling vaginal discharge  Difficulty urinating  Nausea and vomiting  Increased pain at the site of the incision that is unrelieved with pain medicine.  Difficulty breathing with or without chest pain  New calf pain especially if only on one side  Sudden, continuing increased vaginal bleeding with or  without clots.   Contacts: For questions or concerns you should contact:  Dr. Jeral Pinch at [phone]  Joylene John, NP at [phone]  After Hours: call [phone] and have the GYN Oncologist paged/contacted   Post Anesthesia Home Care Instructions  Activity: Get plenty of rest for the remainder of the day. A responsible adult should stay with you for 24 hours following the procedure.  For the next 24 hours, DO NOT: -Drive a car -Paediatric nurse -Drink alcoholic beverages -Take any medication unless instructed by your physician -Make any legal decisions or sign important papers.  Meals: Start with liquid foods such as gelatin or soup. Progress to regular foods as tolerated. Avoid greasy, spicy, heavy foods. If nausea and/or vomiting occur, drink only clear liquids until the nausea and/or vomiting subsides. Call your physician if vomiting continues.  Special Instructions/Symptoms: Your throat may feel dry or sore from the anesthesia or the breathing tube placed in your throat during surgery. If this causes discomfort, gargle with warm salt water. The discomfort should disappear within 24 hours.  If you had a scopolamine patch placed behind your ear for the management of post- operative nausea and/or vomiting:  1. The medication in the patch is effective for 72 hours, after which it should be removed.  Wrap patch in a tissue and discard in the trash. Wash hands thoroughly with  soap and water. 2. You may remove the patch earlier than 72 hours if you experience unpleasant side effects which may include dry mouth, dizziness or visual disturbances. 3. Avoid touching the patch. Wash your hands with soap and water after contact with the patch.

## 2020-03-27 NOTE — Interval H&P Note (Signed)
History and Physical Interval Note:  03/27/2020 7:14 AM  Danielle Calderon  has presented today for surgery, with the diagnosis of COMPLEX ATYPICAL ENDOMETRIAL HYPERPLASIA.  The various methods of treatment have been discussed with the patient and family. After consideration of risks, benefits and other options for treatment, the patient has consented to  Procedure(s): XI ROBOTIC ASSISTED TOTAL HYSTERECTOMY WITH BILATERAL SALPINGO OOPHORECTOMY (N/A) POSSIBLE LYMPH NODE DISSECTION, POSSIBLE LAPAROTOMY (N/A) as a surgical intervention.  The patient's history has been reviewed, patient examined, no change in status, stable for surgery.  I have reviewed the patient's chart and labs.  Questions were answered to the patient's satisfaction.     Danielle Calderon

## 2020-03-27 NOTE — Transfer of Care (Signed)
°  Last Vitals:  Vitals Value Taken Time  BP 171/98 03/27/20 1137  Temp 36.4 C 03/27/20 1137  Pulse 73 03/27/20 1144  Resp 17 03/27/20 1144  SpO2 94 % 03/27/20 1144  Vitals shown include unvalidated device data.  Last Pain:  Vitals:   03/27/20 0740  TempSrc: Oral  PainSc: 0-No pain      Patients Stated Pain Goal: 3 (03/27/20 0740)  Immediate Anesthesia Transfer of Care Note  Patient: Danielle Calderon  Procedure(s) Performed: Procedure(s) (LRB): XI ROBOTIC ASSISTED TOTAL HYSTERECTOMY WITH BILATERAL SALPINGO OOPHORECTOMY (N/A)  Patient Location: PACU  Anesthesia Type: General  Level of Consciousness: awake, alert  and oriented  Airway & Oxygen Therapy: Patient Spontanous Breathing and Patient connected to face mask oxygen  Post-op Assessment: Report given to PACU RN and Post -op Vital signs reviewed and stable  Post vital signs: Reviewed and stable  Complications: No apparent anesthesia complications

## 2020-03-28 ENCOUNTER — Other Ambulatory Visit: Payer: Self-pay | Admitting: Obstetrics

## 2020-03-28 ENCOUNTER — Encounter (HOSPITAL_BASED_OUTPATIENT_CLINIC_OR_DEPARTMENT_OTHER): Payer: Self-pay | Admitting: Gynecologic Oncology

## 2020-03-28 ENCOUNTER — Other Ambulatory Visit: Payer: Self-pay | Admitting: Nurse Practitioner

## 2020-03-28 ENCOUNTER — Telehealth: Payer: Self-pay

## 2020-03-28 DIAGNOSIS — G629 Polyneuropathy, unspecified: Secondary | ICD-10-CM

## 2020-03-28 DIAGNOSIS — R9389 Abnormal findings on diagnostic imaging of other specified body structures: Secondary | ICD-10-CM

## 2020-03-28 LAB — SURGICAL PATHOLOGY

## 2020-03-28 NOTE — Telephone Encounter (Signed)
Ms Schnitzer is eating, drinking, and urinating well. She is passing gas.  Instructed her to increase the senokot-s to 2 tabs with 8 oz of water bid.  If no BM by Thursday 03-30-20, she can add a capful of Miralax bid. Afebrile. Incisions are D&I Pain controled with Ibuprofen and tylenol. Pt aware of post op appointments as well as office number [phone] to call if any questions or concerns.

## 2020-04-11 ENCOUNTER — Telehealth: Payer: Self-pay | Admitting: *Deleted

## 2020-04-11 ENCOUNTER — Encounter: Payer: Self-pay | Admitting: Gynecologic Oncology

## 2020-04-11 ENCOUNTER — Inpatient Hospital Stay: Payer: 59 | Attending: Gynecologic Oncology | Admitting: Gynecologic Oncology

## 2020-04-11 DIAGNOSIS — Z90710 Acquired absence of both cervix and uterus: Secondary | ICD-10-CM

## 2020-04-11 DIAGNOSIS — N8500 Endometrial hyperplasia, unspecified: Secondary | ICD-10-CM

## 2020-04-11 DIAGNOSIS — Z90722 Acquired absence of ovaries, bilateral: Secondary | ICD-10-CM

## 2020-04-11 NOTE — Progress Notes (Signed)
The patient is scheduled for a phone visit with me today. I called her home/work number. There was no answer, and the mailbox is full so I was unable to leave a voicemail. Valarie Cones MD

## 2020-04-11 NOTE — Telephone Encounter (Signed)
Called the patient and moved her appt from 10/19 to 11/1

## 2020-04-11 NOTE — Progress Notes (Signed)
Gynecologic Oncology Telehealth Consult Note: Gyn-Onc  I connected with Kylin Genna on 04/11/20 at  3:15 PM EDT by telephone and verified that I am speaking with the correct person using two identifiers.  I discussed the limitations, risks, security and privacy concerns of performing an evaluation and management service by telemedicine and the availability of in-person appointments. I also discussed with the patient that there may be a patient responsible charge related to this service. The patient expressed understanding and agreed to proceed.  Other persons participating in the visit and their role in the encounter: none.  Patient's location: home Provider's location: hospital  Reason for Visit: post-op follow-up  Interval History: Patient is doing very well since surgery. She took ibuprofen on POD#2, otherwise denies significant pain or needing pain medication. Denies VB or discharge. Reports normal bowel and bladder function. Tolerating a regular diet. Has lost 8 pounds, is eating less and walking about 30 minutes a day.  Past Medical/Surgical History: Past Medical History:  Diagnosis Date  . Anxiety   . Depression   . Diabetes mellitus without complication ([place])    Type II  . GERD (gastroesophageal reflux disease)   . Headache    mild daily takes gabapentin  . History of migraine headaches   . Hyperlipidemia   . Hypertension     Past Surgical History:  Procedure Laterality Date  . COLONOSCOPY W/ POLYPECTOMY    . DILATATION & CURETTAGE/HYSTEROSCOPY WITH MYOSURE N/A 03/14/2020   Procedure: DILATATION & CURETTAGE/HYSTEROSCOPY WITH MYOSURE;  Surgeon: Sloan Leiter, MD;  Location: [facility];  Service: Gynecology;  Laterality: N/A;  . ROBOTIC ASSISTED TOTAL HYSTERECTOMY WITH BILATERAL SALPINGO OOPHERECTOMY N/A 03/27/2020   Procedure: XI ROBOTIC ASSISTED TOTAL HYSTERECTOMY WITH BILATERAL SALPINGO OOPHORECTOMY;  Surgeon: Lafonda Mosses, MD;  Location: [facility];   Service: Gynecology;  Laterality: N/A;    Family History  Problem Relation Age of Onset  . Hypertension Mother   . Hypertension Father   . Cancer Father   . Diabetes Father   . Breast cancer Maternal Aunt   . AAA (abdominal aortic aneurysm) Maternal Aunt   . Cancer Maternal Great-grandmother        cervical vs. uterine (sounds like cervical, treated with cryotherapy)  . Breast cancer Cousin        maternal    Social History   Socioeconomic History  . Marital status: Divorced    Spouse name: Not on file  . Number of children: Not on file  . Years of education: Not on file  . Highest education level: Not on file  Occupational History  . Not on file  Tobacco Use  . Smoking status: Former Smoker    Years: 17.00    Quit date: 2004    Years since quitting: 17.7  . Smokeless tobacco: Never Used  Vaping Use  . Vaping Use: Never used  Substance and Sexual Activity  . Alcohol use: Yes    Alcohol/week: 2.0 standard drinks    Types: 2 Glasses of wine per week  . Drug use: No  . Sexual activity: Not Currently  Other Topics Concern  . Not on file  Social History Narrative  . Not on file   Social Determinants of Health   Financial Resource Strain:   . Difficulty of Paying Living Expenses: Not on file  Food Insecurity:   . Worried About Charity fundraiser in the Last Year: Not on file  . Ran Out of Food in the  Last Year: Not on file  Transportation Needs:   . Lack of Transportation (Medical): Not on file  . Lack of Transportation (Non-Medical): Not on file  Physical Activity:   . Days of Exercise per Week: Not on file  . Minutes of Exercise per Session: Not on file  Stress:   . Feeling of Stress : Not on file  Social Connections:   . Frequency of Communication with Friends and Family: Not on file  . Frequency of Social Gatherings with Friends and Family: Not on file  . Attends Religious Services: Not on file  . Active Member of Clubs or Organizations: Not on file  .  Attends Archivist Meetings: Not on file  . Marital Status: Not on file    Current Medications:  Current Outpatient Medications:  .  alendronate (FOSAMAX) 70 MG tablet, TAKE 1 TABLET BY MOUTH EVERY 7 DAYS. TAKE WITH A FULL GLASS OF WATER ON AN EMPTY STOMACH (Patient taking differently: Take 70 mg by mouth once a week. ), Disp: 4 tablet, Rfl: 2 .  calcium carbonate 1250 MG capsule, Take 1,200 mg by mouth at bedtime. , Disp: , Rfl:  .  cholecalciferol (VITAMIN D) 1000 UNITS tablet, Take 2,000 Units by mouth at bedtime. , Disp: , Rfl:  .  citalopram (CELEXA) 40 MG tablet, TAKE 1 TABLET BY MOUTH EVERY DAY (Patient taking differently: Take 40 mg by mouth at bedtime. ), Disp: 30 tablet, Rfl: 5 .  diphenhydrAMINE (SOMINEX) 25 MG tablet, Take 25 mg by mouth at bedtime as needed for allergies. , Disp: , Rfl:  .  gabapentin (NEURONTIN) 300 MG capsule, TAKE 1 CAPSULE BY MOUTH TWICE A DAY, Disp: 60 capsule, Rfl: 0 .  ibuprofen (ADVIL) 600 MG tablet, Take 1 tablet (600 mg total) by mouth every 6 (six) hours as needed for headache, mild pain, moderate pain or cramping., Disp: 30 tablet, Rfl: 1 .  Magnesium 250 MG TABS, Take 250 mg by mouth at bedtime., Disp: , Rfl:  .  metFORMIN (GLUCOPHAGE) 500 MG tablet, Take 1 tablet (500 mg total) by mouth 2 (two) times daily with a meal., Disp: 60 tablet, Rfl: 2 .  omeprazole (PRILOSEC) 20 MG capsule, Take 20 mg by mouth daily., Disp: , Rfl:  .  oxyCODONE (ROXICODONE) 5 MG immediate release tablet, Take 1 tablet (5 mg total) by mouth every 4 (four) hours as needed for severe pain., Disp: 30 tablet, Rfl: 0 .  rosuvastatin (CRESTOR) 5 MG tablet, TAKE 1 TABLET BY MOUTH EVERY DAY (Patient taking differently: Take 5 mg by mouth at bedtime. ), Disp: 30 tablet, Rfl: 2 .  senna-docusate (SENOKOT-S) 8.6-50 MG tablet, Take 2 tablets by mouth at bedtime. For AFTER surgery, do not take if having diarrhea, Disp: 30 tablet, Rfl: 0 .  telmisartan (MICARDIS) 40 MG tablet, Take  1 tablet (40 mg total) by mouth daily. (Patient taking differently: Take 40 mg by mouth at bedtime. ), Disp: 90 tablet, Rfl: 1  Review of Symptoms: Pertinent positives as per HPI, otherwise ROS negative.  Physical Exam: There were no vitals taken for this visit. Deferred given limitations of phone visit  Laboratory & Radiologic Studies: A. UTERUS, CERVIX AND BILATERAL FALLOPIAN TUBES AND OVARIES,  HYSTERECTOMY AND BILATERAL SALPINGO-OOPHORECTOMY:  Cervix:  - Mild acute and chronic cervicitis. Nabothian cysts.   Uterus:  - Endometrium: Attenuated inactive endometrium with scattered cystically  dilated glands.  - Myometrium: Adenomyosis. Leiomyomata.  - Serosa: No significant histopathologic findings.   Adnexa:  -  Ovaries: Inclusion cysts. Corpora albicantia.  - Fallopian tubes: No significant histopathologic findings.   Assessment & Plan: Danielle Calderon is a 59 y.o. woman who is s/p TRH/BSO on 9/27 for CAH with no residual hyperplasia noted on final pathology.  She is doing exceptionally well after surgery. We reviewed pathology again and I praised her on her weight loss to date. She is scheduled for in person follow-up with me in early November.  I discussed the assessment and treatment plan with the patient. The patient was provided with an opportunity to ask questions and all were answered. The patient agreed with the plan and demonstrated an understanding of the instructions.   The patient was advised to call back or see an in-person evaluation if the symptoms worsen or if the condition fails to improve as anticipated.   14 minutes of total time was spent for this patient encounter, including preparation, face-to-face counseling with the patient and coordination of care, and documentation of the encounter.   Jeral Pinch, MD  Division of Gynecologic Oncology  Department of Obstetrics and Gynecology  [facility]

## 2020-04-14 ENCOUNTER — Encounter: Payer: Self-pay | Admitting: Gynecologic Oncology

## 2020-04-14 ENCOUNTER — Telehealth: Payer: Self-pay | Admitting: *Deleted

## 2020-04-14 NOTE — Telephone Encounter (Signed)
Patient called and stated "my job said even through the [facility] paperwork was filled out that they need a letter says I can come back to work on 10/26 with no restriction." Message forwarded to [facility] APP

## 2020-04-14 NOTE — Telephone Encounter (Signed)
Called the patient back and explained that the letter for FMLA has been e-mail to her employer

## 2020-04-18 ENCOUNTER — Encounter: Payer: 59 | Admitting: Gynecologic Oncology

## 2020-04-24 ENCOUNTER — Encounter: Payer: Self-pay | Admitting: Obstetrics and Gynecology

## 2020-04-24 ENCOUNTER — Other Ambulatory Visit: Payer: Self-pay

## 2020-04-24 ENCOUNTER — Ambulatory Visit (INDEPENDENT_AMBULATORY_CARE_PROVIDER_SITE_OTHER): Payer: 59 | Admitting: Obstetrics and Gynecology

## 2020-04-24 VITALS — BP 130/81 | HR 68 | Ht 64.0 in | Wt 221.3 lb

## 2020-04-24 DIAGNOSIS — Z9889 Other specified postprocedural states: Secondary | ICD-10-CM | POA: Diagnosis not present

## 2020-04-24 DIAGNOSIS — N8502 Endometrial intraepithelial neoplasia [EIN]: Secondary | ICD-10-CM

## 2020-04-24 NOTE — Progress Notes (Signed)
Pt presents for post op s/p Robotic-assisted laparoscopic total hysterectomy with bilateral salpingoophorectomy on 03/27/2020 Doing well no complaints per pt

## 2020-04-24 NOTE — Progress Notes (Signed)
GYNECOLOGY OFFICE FOLLOW UP NOTE  History:  59 y.o. [id number] here today for follow up for D&C hysteroscopy. She is doing well. Of note, path showed atypical hyperplasia and she was referred to [facility] for surgery. She has since had TLH, BSO for same, with negative path. States she is doing very well, had some pain but only had to take pain medication one day. She is very happy with outcome.   Past Medical History:  Diagnosis Date  . Anxiety   . Depression   . Diabetes mellitus without complication ([place])    Type II  . GERD (gastroesophageal reflux disease)   . Headache    mild daily takes gabapentin  . History of migraine headaches   . Hyperlipidemia   . Hypertension     Past Surgical History:  Procedure Laterality Date  . COLONOSCOPY W/ POLYPECTOMY    . DILATATION & CURETTAGE/HYSTEROSCOPY WITH MYOSURE N/A 03/14/2020   Procedure: DILATATION & CURETTAGE/HYSTEROSCOPY WITH MYOSURE;  Surgeon: Sloan Leiter, MD;  Location: [facility];  Service: Gynecology;  Laterality: N/A;  . ROBOTIC ASSISTED TOTAL HYSTERECTOMY WITH BILATERAL SALPINGO OOPHERECTOMY N/A 03/27/2020   Procedure: XI ROBOTIC ASSISTED TOTAL HYSTERECTOMY WITH BILATERAL SALPINGO OOPHORECTOMY;  Surgeon: Lafonda Mosses, MD;  Location: [facility];  Service: Gynecology;  Laterality: N/A;     Current Outpatient Medications:  .  alendronate (FOSAMAX) 70 MG tablet, TAKE 1 TABLET BY MOUTH EVERY 7 DAYS. TAKE WITH A FULL GLASS OF WATER ON AN EMPTY STOMACH (Patient taking differently: Take 70 mg by mouth once a week. ), Disp: 4 tablet, Rfl: 2 .  calcium carbonate 1250 MG capsule, Take 1,200 mg by mouth at bedtime. , Disp: , Rfl:  .  cholecalciferol (VITAMIN D) 1000 UNITS tablet, Take 2,000 Units by mouth at bedtime. , Disp: , Rfl:  .  citalopram (CELEXA) 40 MG tablet, TAKE 1 TABLET BY MOUTH EVERY DAY (Patient taking differently: Take 40 mg by mouth at bedtime. ), Disp: 30 tablet, Rfl: 5 .  diphenhydrAMINE (SOMINEX) 25 MG  tablet, Take 25 mg by mouth at bedtime as needed for allergies. , Disp: , Rfl:  .  gabapentin (NEURONTIN) 300 MG capsule, TAKE 1 CAPSULE BY MOUTH TWICE A DAY, Disp: 60 capsule, Rfl: 0 .  ibuprofen (ADVIL) 600 MG tablet, Take 1 tablet (600 mg total) by mouth every 6 (six) hours as needed for headache, mild pain, moderate pain or cramping., Disp: 30 tablet, Rfl: 1 .  Magnesium 250 MG TABS, Take 250 mg by mouth at bedtime., Disp: , Rfl:  .  metFORMIN (GLUCOPHAGE) 500 MG tablet, Take 1 tablet (500 mg total) by mouth 2 (two) times daily with a meal., Disp: 60 tablet, Rfl: 2 .  omeprazole (PRILOSEC) 20 MG capsule, Take 20 mg by mouth daily., Disp: , Rfl:  .  rosuvastatin (CRESTOR) 5 MG tablet, TAKE 1 TABLET BY MOUTH EVERY DAY (Patient taking differently: Take 5 mg by mouth at bedtime. ), Disp: 30 tablet, Rfl: 2 .  senna-docusate (SENOKOT-S) 8.6-50 MG tablet, Take 2 tablets by mouth at bedtime. For AFTER surgery, do not take if having diarrhea, Disp: 30 tablet, Rfl: 0 .  telmisartan (MICARDIS) 40 MG tablet, Take 1 tablet (40 mg total) by mouth daily. (Patient taking differently: Take 40 mg by mouth at bedtime. ), Disp: 90 tablet, Rfl: 1 .  oxyCODONE (ROXICODONE) 5 MG immediate release tablet, Take 1 tablet (5 mg total) by mouth every 4 (four) hours as needed for severe  pain. (Patient not taking: Reported on 04/24/2020), Disp: 30 tablet, Rfl: 0  The following portions of the patient's history were reviewed and updated as appropriate: allergies, current medications, past family history, past medical history, past social history, past surgical history and problem list.   Review of Systems:  Pertinent items noted in HPI and remainder of comprehensive ROS otherwise negative.   Objective:  Physical Exam BP 130/81   Pulse 68   Ht 5\' 4"  (1.626 m)   Wt 221 lb 4.8 oz (100.4 kg)   BMI 37.99 kg/m  CONSTITUTIONAL: Well-developed, well-nourished female in no acute distress.  HENT:  Normocephalic, atraumatic.  External right and left ear normal. Oropharynx is clear and moist EYES: Conjunctivae and EOM are normal. Pupils are equal, round, and reactive to light. No scleral icterus.  NECK: Normal range of motion, supple, no masses SKIN: Skin is warm and dry. No rash noted. Not diaphoretic. No erythema. No pallor. NEUROLOGIC: Alert and oriented to person, place, and time. Normal reflexes, muscle tone coordination. No cranial nerve deficit noted. PSYCHIATRIC: Normal mood and affect. Normal behavior. Normal judgment and thought content. CARDIOVASCULAR: Normal heart rate noted RESPIRATORY: Effort normal, no problems with respiration noted ABDOMEN: Soft, no distention noted.   PELVIC: deferred MUSCULOSKELETAL: Normal range of motion. No edema noted.  Labs and Imaging No results found.  Assessment & Plan:   1. Postoperative state Doing well, no issues Return 1 year or sooner as needed  2. Complex atypical endometrial hyperplasia F/u with Dr. Berline Lopes  Routine preventative health maintenance measures emphasized. Please refer to After Visit Summary for other counseling recommendations.   Return in about 1 year (around 04/24/2021).  Total face-to-face time with patient: 15 minutes. Over 50% of encounter was spent on counseling and coordination of care.  Feliz Beam, M.D. Attending Center for [employer] farm manager)

## 2020-05-01 ENCOUNTER — Inpatient Hospital Stay: Payer: 59 | Attending: Gynecologic Oncology | Admitting: Gynecologic Oncology

## 2020-05-01 ENCOUNTER — Other Ambulatory Visit: Payer: Self-pay

## 2020-05-01 ENCOUNTER — Encounter: Payer: Self-pay | Admitting: Gynecologic Oncology

## 2020-05-01 VITALS — BP 145/75 | HR 66 | Temp 98.2°F | Resp 18 | Wt 216.2 lb

## 2020-05-01 DIAGNOSIS — E669 Obesity, unspecified: Secondary | ICD-10-CM

## 2020-05-01 DIAGNOSIS — Z90722 Acquired absence of ovaries, bilateral: Secondary | ICD-10-CM | POA: Insufficient documentation

## 2020-05-01 DIAGNOSIS — Z90710 Acquired absence of both cervix and uterus: Secondary | ICD-10-CM | POA: Insufficient documentation

## 2020-05-01 DIAGNOSIS — N8500 Endometrial hyperplasia, unspecified: Secondary | ICD-10-CM | POA: Insufficient documentation

## 2020-05-01 NOTE — Progress Notes (Signed)
Gynecologic Oncology Return Clinic Visit  11/1  Reason for Visit: post-op follow-up  Treatment History: Late 2020 - began having PMB 07/2019 - EMB showed scant atrophic endometrium 10/2019 - Ultrasound showed 81mm endometrial lining 03/14/20 - D&C showed small focus suspicious for atypical hyperplasia 03/27/20 - TRH/BSO, no residual hyperplasia on hysterectomy specimen  Interval History: Presents today for follow-up after surgery. Is doing very well. Denies any pain. Went back to work last week. Tolerating PO without nausea or emesis. Reports regular bowel and bladder function. Denies vaginal bleeding or discharge.   Past Medical/Surgical History: Past Medical History:  Diagnosis Date  . Anxiety   . Depression   . Diabetes mellitus without complication ([place])    Type II  . GERD (gastroesophageal reflux disease)   . Headache    mild daily takes gabapentin  . History of migraine headaches   . Hyperlipidemia   . Hypertension     Past Surgical History:  Procedure Laterality Date  . COLONOSCOPY W/ POLYPECTOMY    . DILATATION & CURETTAGE/HYSTEROSCOPY WITH MYOSURE N/A 03/14/2020   Procedure: DILATATION & CURETTAGE/HYSTEROSCOPY WITH MYOSURE;  Surgeon: Sloan Leiter, MD;  Location: [facility];  Service: Gynecology;  Laterality: N/A;  . ROBOTIC ASSISTED TOTAL HYSTERECTOMY WITH BILATERAL SALPINGO OOPHERECTOMY N/A 03/27/2020   Procedure: XI ROBOTIC ASSISTED TOTAL HYSTERECTOMY WITH BILATERAL SALPINGO OOPHORECTOMY;  Surgeon: Lafonda Mosses, MD;  Location: [facility];  Service: Gynecology;  Laterality: N/A;    Family History  Problem Relation Age of Onset  . Hypertension Mother   . Hypertension Father   . Cancer Father   . Diabetes Father   . Breast cancer Maternal Aunt   . AAA (abdominal aortic aneurysm) Maternal Aunt   . Cancer Maternal Great-grandmother        cervical vs. uterine (sounds like cervical, treated with cryotherapy)  . Breast cancer Cousin        maternal     Social History   Socioeconomic History  . Marital status: Divorced    Spouse name: Not on file  . Number of children: Not on file  . Years of education: Not on file  . Highest education level: Not on file  Occupational History  . Not on file  Tobacco Use  . Smoking status: Former Smoker    Years: 17.00    Quit date: 2004    Years since quitting: 17.8  . Smokeless tobacco: Never Used  Vaping Use  . Vaping Use: Never used  Substance and Sexual Activity  . Alcohol use: Yes    Alcohol/week: 2.0 standard drinks    Types: 2 Glasses of wine per week  . Drug use: No  . Sexual activity: Not Currently  Other Topics Concern  . Not on file  Social History Narrative  . Not on file   Social Determinants of Health   Financial Resource Strain:   . Difficulty of Paying Living Expenses: Not on file  Food Insecurity:   . Worried About Charity fundraiser in the Last Year: Not on file  . Ran Out of Food in the Last Year: Not on file  Transportation Needs:   . Lack of Transportation (Medical): Not on file  . Lack of Transportation (Non-Medical): Not on file  Physical Activity:   . Days of Exercise per Week: Not on file  . Minutes of Exercise per Session: Not on file  Stress:   . Feeling of Stress : Not on file  Social Connections:   .  Frequency of Communication with Friends and Family: Not on file  . Frequency of Social Gatherings with Friends and Family: Not on file  . Attends Religious Services: Not on file  . Active Member of Clubs or Organizations: Not on file  . Attends Archivist Meetings: Not on file  . Marital Status: Not on file    Current Medications:  Current Outpatient Medications:  .  alendronate (FOSAMAX) 70 MG tablet, TAKE 1 TABLET BY MOUTH EVERY 7 DAYS. TAKE WITH A FULL GLASS OF WATER ON AN EMPTY STOMACH (Patient taking differently: Take 70 mg by mouth once a week. ), Disp: 4 tablet, Rfl: 2 .  calcium carbonate 1250 MG capsule, Take 1,200 mg by  mouth at bedtime. , Disp: , Rfl:  .  cholecalciferol (VITAMIN D) 1000 UNITS tablet, Take 2,000 Units by mouth at bedtime. , Disp: , Rfl:  .  citalopram (CELEXA) 40 MG tablet, TAKE 1 TABLET BY MOUTH EVERY DAY (Patient taking differently: Take 40 mg by mouth at bedtime. ), Disp: 30 tablet, Rfl: 5 .  diphenhydrAMINE (SOMINEX) 25 MG tablet, Take 25 mg by mouth at bedtime as needed for allergies. , Disp: , Rfl:  .  gabapentin (NEURONTIN) 300 MG capsule, TAKE 1 CAPSULE BY MOUTH TWICE A DAY, Disp: 60 capsule, Rfl: 0 .  ibuprofen (ADVIL) 600 MG tablet, Take 1 tablet (600 mg total) by mouth every 6 (six) hours as needed for headache, mild pain, moderate pain or cramping., Disp: 30 tablet, Rfl: 1 .  Magnesium 250 MG TABS, Take 250 mg by mouth at bedtime., Disp: , Rfl:  .  metFORMIN (GLUCOPHAGE) 500 MG tablet, Take 1 tablet (500 mg total) by mouth 2 (two) times daily with a meal., Disp: 60 tablet, Rfl: 2 .  omeprazole (PRILOSEC) 20 MG capsule, Take 20 mg by mouth daily., Disp: , Rfl:  .  oxyCODONE (ROXICODONE) 5 MG immediate release tablet, Take 1 tablet (5 mg total) by mouth every 4 (four) hours as needed for severe pain., Disp: 30 tablet, Rfl: 0 .  rosuvastatin (CRESTOR) 5 MG tablet, TAKE 1 TABLET BY MOUTH EVERY DAY (Patient taking differently: Take 5 mg by mouth at bedtime. ), Disp: 30 tablet, Rfl: 2 .  senna-docusate (SENOKOT-S) 8.6-50 MG tablet, Take 2 tablets by mouth at bedtime. For AFTER surgery, do not take if having diarrhea, Disp: 30 tablet, Rfl: 0 .  telmisartan (MICARDIS) 40 MG tablet, Take 1 tablet (40 mg total) by mouth daily. (Patient taking differently: Take 40 mg by mouth at bedtime. ), Disp: 90 tablet, Rfl: 1  Review of Systems: Denies appetite changes, fevers, chills, fatigue, unexplained weight changes. Denies hearing loss, neck lumps or masses, mouth sores, ringing in ears or voice changes. Denies cough or wheezing.  Denies shortness of breath. Denies chest pain or palpitations. Denies  leg swelling. Denies abdominal distention, pain, blood in stools, constipation, diarrhea, nausea, vomiting, or early satiety. Denies pain with intercourse, dysuria, frequency, hematuria or incontinence. Denies hot flashes, pelvic pain, vaginal bleeding or vaginal discharge.   Denies joint pain, back pain or muscle pain/cramps. Denies itching, rash, or wounds. Denies dizziness, headaches, numbness or seizures. Denies swollen lymph nodes or glands, denies easy bruising or bleeding. Denies anxiety, depression, confusion, or decreased concentration.  Physical Exam: BP (!) 145/75 (BP Location: Left Arm, Patient Position: Sitting)   Pulse 66   Temp 98.2 F (36.8 C) (Tympanic)   Resp 18   Wt 216 lb 3.2 oz (98.1 kg)   SpO2  100%   BMI 37.11 kg/m  General: Alert, oriented, no acute distress. HEENT: Normocephalic, atraumatic, sclera anicteric. Chest: Unlabored breathing on room air. Abdomen: Obese, soft, nontender.  Normoactive bowel sounds.  No masses or hepatosplenomegaly appreciated.  Well-healed laparoscopic incisions. Extremities: Grossly normal range of motion.  Warm, well perfused.  No edema bilaterally. Skin: No rashes or lesions noted. GU: Normal appearing external genitalia without erythema, excoriation, or lesions.  Speculum exam reveals cuff intact, sutures visible along midportion of the cuff, no blood or discharge within the vault.  Bimanual exam reveals cuff intact, no tenderness or fluctuance.    Laboratory & Radiologic Studies: A. UTERUS, CERVIX AND BILATERAL FALLOPIAN TUBES AND OVARIES,  HYSTERECTOMY AND BILATERAL SALPINGO-OOPHORECTOMY:  Cervix:  - Mild acute and chronic cervicitis. Nabothian cysts.   Uterus:  - Endometrium: Attenuated inactive endometrium with scattered cystically  dilated glands.  - Myometrium: Adenomyosis. Leiomyomata.  - Serosa: No significant histopathologic findings.   Adnexa:  - Ovaries: Inclusion cysts. Corpora albicantia.  - Fallopian  tubes: No significant histopathologic findings.  Assessment & Plan: Danielle Calderon is a 59 y.o. woman with  who presents for follow-up 1 month after TRH/BSO for endometrial hyperplasia with no residual hyperplasia on hysterectomy.   The patient is doing very well from a post-op standpoint. We reviewed continued activity restrictions.   I praised her on her weight loss so far (7 lbs by our scale since her pre-op visit on 9/21). She has made some diet changes and is walking daily. She is very happy with her progress and I encouraged her to continue these interventions. I am releasing her back to routine well woman care with her gynecologist.  20 minutes of total time was spent for this patient encounter, including preparation, face-to-face counseling with the patient and coordination of care, and documentation of the encounter.  Jeral Pinch, MD  Division of Gynecologic Oncology  Department of Obstetrics and Gynecology  [facility]

## 2020-05-01 NOTE — Patient Instructions (Signed)
You are healing great from surgery! Remember, no heavy lifting/pushing/pulling until 6 weeks after surgery. I still seem some stitches at the top of the vagina (very normal), so nothing in the vagina until at least 8 weeks after surgery.  Congratulations on your weight loss! I wish you all the best with continued exercise and dietary changes. I am releasing you back to care with your gynecologist. Don't hesitate to contact my office if you need anything in the future.  Jeral Pinch MD Gynecologic Oncology [phone]

## 2020-05-09 ENCOUNTER — Other Ambulatory Visit: Payer: Self-pay | Admitting: Nurse Practitioner

## 2020-05-09 DIAGNOSIS — G629 Polyneuropathy, unspecified: Secondary | ICD-10-CM

## 2020-05-12 ENCOUNTER — Other Ambulatory Visit: Payer: Self-pay | Admitting: Nurse Practitioner

## 2020-05-18 ENCOUNTER — Encounter: Payer: Self-pay | Admitting: Nurse Practitioner

## 2020-05-18 ENCOUNTER — Other Ambulatory Visit: Payer: Self-pay

## 2020-05-18 ENCOUNTER — Ambulatory Visit (INDEPENDENT_AMBULATORY_CARE_PROVIDER_SITE_OTHER): Payer: 59 | Admitting: Nurse Practitioner

## 2020-05-18 VITALS — BP 144/86 | HR 76 | Temp 97.6°F | Ht 64.8 in | Wt 214.4 lb

## 2020-05-18 DIAGNOSIS — Z6835 Body mass index (BMI) 35.0-35.9, adult: Secondary | ICD-10-CM | POA: Diagnosis not present

## 2020-05-18 DIAGNOSIS — I10 Essential (primary) hypertension: Secondary | ICD-10-CM

## 2020-05-18 DIAGNOSIS — E1169 Type 2 diabetes mellitus with other specified complication: Secondary | ICD-10-CM | POA: Diagnosis not present

## 2020-05-18 DIAGNOSIS — E669 Obesity, unspecified: Secondary | ICD-10-CM

## 2020-05-18 DIAGNOSIS — E782 Mixed hyperlipidemia: Secondary | ICD-10-CM

## 2020-05-18 MED ORDER — BLOOD GLUCOSE MONITOR KIT: PACK | 0 refills | Status: DC

## 2020-05-18 MED ORDER — ROSUVASTATIN CALCIUM 5 MG PO TABS: 5.0000 mg | ORAL_TABLET | Freq: Every day | ORAL | 1 refills | Status: DC

## 2020-05-18 NOTE — Patient Instructions (Signed)

## 2020-05-18 NOTE — Progress Notes (Signed)
I,Yamilka Roman [employer] as a Education administrator for [employer], FNP.,have documented all relevant documentation on the behalf of [name], FNP,as directed by  [name], FNP while in the presence of [name], [place]. This visit occurred during the SARS-CoV-2 public health emergency.  Safety protocols were in place, including screening questions prior to the visit, additional usage of staff PPE, and extensive cleaning of exam room while observing appropriate contact time as indicated for disinfecting solutions.  Subjective:     Patient ID: Danielle Calderon , female    DOB: [birth year] , 59 y.o.   MRN: [id number]   Chief Complaint  Patient presents with  . Hypertension  . Prediabetes    HPI  Patient here for a f/u on her prediabetes and blood pressure.  Wt Readings from Last 3 Encounters: 05/18/20 : 214 lb 6.4 oz (97.3 kg) 05/01/20 : 216 lb 3.2 oz (98.1 kg) 04/24/20 : 221 lb 4.8 oz (100.4 kg)  She takes her blood pressure medication at night. She had flounder last night at a restaurant. She is walking 3 times a week  Hypertension This is a chronic problem. The current episode started more than 1 year ago. The problem is unchanged. The problem is uncontrolled (has not taken her medication today). Pertinent negatives include no anxiety, chest pain, headaches (interminttent - thought to be nerve related) or shortness of breath. There are no associated agents to hypertension. Risk factors for coronary artery disease include obesity and sedentary lifestyle. Past treatments include angiotensin blockers. Compliance problems include exercise and diet.  There is no history of kidney disease. There is no history of chronic renal disease.     Past Medical History:  Diagnosis Date  . Anxiety   . Depression   . Diabetes mellitus without complication ([place])    Type II  . GERD (gastroesophageal reflux disease)   . Headache    mild daily takes gabapentin  . History of migraine headaches   .  Hyperlipidemia   . Hypertension      Family History  Problem Relation Age of Onset  . Hypertension Mother   . Hypertension Father   . Cancer Father   . Diabetes Father   . Breast cancer Maternal Aunt   . AAA (abdominal aortic aneurysm) Maternal Aunt   . Cancer Maternal Great-grandmother        cervical vs. uterine (sounds like cervical, treated with cryotherapy)  . Breast cancer Cousin        maternal     Current Outpatient Medications:  .  alendronate (FOSAMAX) 70 MG tablet, TAKE 1 TABLET BY MOUTH EVERY 7 DAYS. TAKE WITH A FULL GLASS OF WATER ON AN EMPTY STOMACH (Patient taking differently: Take 70 mg by mouth once a week. ), Disp: 4 tablet, Rfl: 2 .  calcium carbonate 1250 MG capsule, Take 1,200 mg by mouth at bedtime. , Disp: , Rfl:  .  cholecalciferol (VITAMIN D) 1000 UNITS tablet, Take 2,000 Units by mouth at bedtime. , Disp: , Rfl:  .  citalopram (CELEXA) 40 MG tablet, TAKE 1 TABLET BY MOUTH EVERY DAY (Patient taking differently: Take 40 mg by mouth at bedtime. ), Disp: 30 tablet, Rfl: 5 .  diphenhydrAMINE (SOMINEX) 25 MG tablet, Take 25 mg by mouth at bedtime as needed for allergies. , Disp: , Rfl:  .  gabapentin (NEURONTIN) 300 MG capsule, TAKE 1 CAPSULE BY MOUTH TWICE A DAY, Disp: 60 capsule, Rfl: 0 .  ibuprofen (ADVIL) 600 MG tablet, Take 1 tablet (600  mg total) by mouth every 6 (six) hours as needed for headache, mild pain, moderate pain or cramping., Disp: 30 tablet, Rfl: 1 .  Magnesium 250 MG TABS, Take 250 mg by mouth at bedtime., Disp: , Rfl:  .  metFORMIN (GLUCOPHAGE) 500 MG tablet, TAKE 1 TABLET (500 MG TOTAL) BY MOUTH 2 (TWO) TIMES DAILY WITH A MEAL., Disp: 60 tablet, Rfl: 2 .  omeprazole (PRILOSEC) 20 MG capsule, Take 20 mg by mouth daily., Disp: , Rfl:  .  oxyCODONE (ROXICODONE) 5 MG immediate release tablet, Take 1 tablet (5 mg total) by mouth every 4 (four) hours as needed for severe pain., Disp: 30 tablet, Rfl: 0 .  rosuvastatin (CRESTOR) 5 MG tablet, Take 1  tablet (5 mg total) by mouth at bedtime., Disp: 90 tablet, Rfl: 1 .  senna-docusate (SENOKOT-S) 8.6-50 MG tablet, Take 2 tablets by mouth at bedtime. For AFTER surgery, do not take if having diarrhea, Disp: 30 tablet, Rfl: 0 .  telmisartan (MICARDIS) 40 MG tablet, Take 1 tablet (40 mg total) by mouth daily. (Patient taking differently: Take 40 mg by mouth at bedtime. ), Disp: 90 tablet, Rfl: 1 .  blood glucose meter kit and supplies KIT, Dispense based on patient and insurance preference. Use up to four times daily as directed. (FOR ICD-9 250.00, 250.01)., Disp: 1 each, Rfl: 0   Allergies  Allergen Reactions  . Lisinopril Cough    Other reaction(s): Cough (ALLERGY/intolerance)     Review of Systems  Constitutional: Negative.   HENT: Negative.   Eyes: Negative.   Respiratory: Negative.  Negative for shortness of breath.   Cardiovascular: Negative.  Negative for chest pain.  Endocrine: Negative.  Negative for polydipsia, polyphagia and polyuria.  Musculoskeletal: Negative.   Skin: Negative.   Neurological: Negative.  Negative for dizziness and headaches (interminttent - thought to be nerve related).  Hematological: Negative.   Psychiatric/Behavioral: Negative.      Today's Vitals   05/18/20 1122  BP: (!) 144/86  Pulse: 76  Temp: 97.6 F (36.4 C)  Weight: 214 lb 6.4 oz (97.3 kg)  Height: 5' 4.8" (1.646 m)  PainSc: 0-No pain   Body mass index is 35.9 kg/m.   Objective:  Physical Exam Vitals reviewed.  Constitutional:      General: She is not in acute distress.    Appearance: Normal appearance. She is obese.  Cardiovascular:     Rate and Rhythm: Normal rate and regular rhythm.     Pulses: Normal pulses.     Heart sounds: Normal heart sounds. No murmur heard.   Pulmonary:     Effort: Pulmonary effort is normal. No respiratory distress.     Breath sounds: Normal breath sounds. No wheezing.  Skin:    Capillary Refill: Capillary refill takes less than 2 seconds.   Neurological:     General: No focal deficit present.     Mental Status: She is alert and oriented to person, place, and time.     Cranial Nerves: No cranial nerve deficit.  Psychiatric:        Mood and Affect: Mood normal.        Behavior: Behavior normal.        Thought Content: Thought content normal.        Judgment: Judgment normal.         Assessment And Plan:     1. Essential hypertension  Chronic, blood pressure is slightly elevated   She has not taken her medications at this time  She is encouraged to avoid high salt foods and to stay well hydrated with water - CMP14+EGFR  2. Type 2 diabetes mellitus with other specified complication, without long-term current use of insulin (HCC)  Chronic, this is a new diagnosis  She is advised to check her blood pressure daily and goal is  70-120  Will refer to diabetic nutritionist - Hemoglobin A1c - Ambulatory referral to diabetic education - blood glucose meter kit and supplies KIT; Dispense based on patient and insurance preference. Use up to four times daily as directed. (FOR ICD-9 250.00, 250.01).  Dispense: 1 each; Refill: 0  3. Mixed hyperlipidemia  Chronic, controlled  Continue with current medications, tolerating well - rosuvastatin (CRESTOR) 5 MG tablet; Take 1 tablet (5 mg total) by mouth at bedtime.  Dispense: 90 tablet; Refill: 1 - Lipid panel  4. Obesity (BMI 30-39.9)  Chronic  Discussed healthy diet and regular exercise options   Encouraged to exercise at least 150 minutes per week with 2 days of strength training  She is encouraged to strive for BMI less than 30 to decrease cardiac risk.     Patient was given opportunity to ask questions. Patient verbalized understanding of the plan and was able to repeat key elements of the plan. All questions were answered to their satisfaction.    Teola Bradley, FNP, have reviewed all documentation for this visit. The documentation on 05/19/20 for the exam,  diagnosis, procedures, and orders are all accurate and complete.   THE PATIENT IS ENCOURAGED TO PRACTICE SOCIAL DISTANCING DUE TO THE COVID-19 PANDEMIC.

## 2020-05-19 LAB — LIPID PANEL
Chol/HDL Ratio: 2.6 ratio (ref 0.0–4.4)
Cholesterol, Total: 160 mg/dL (ref 100–199)
HDL: 62 mg/dL (ref >39)
LDL Chol Calc (NIH): 85 mg/dL (ref 0–99)
Triglycerides: 65 mg/dL (ref 0–149)
VLDL Cholesterol Cal: 13 mg/dL (ref 5–40)

## 2020-05-19 LAB — CMP14+EGFR
ALT: 15 IU/L (ref 0–32)
AST: 18 IU/L (ref 0–40)
Albumin/Globulin Ratio: 1.2 (ref 1.2–2.2)
Albumin: 4.2 g/dL (ref 3.8–4.9)
Alkaline Phosphatase: 63 IU/L (ref 44–121)
BUN/Creatinine Ratio: 11 — ABNORMAL LOW (ref 12–28)
BUN: 10 mg/dL (ref 8–27)
Bilirubin Total: 0.5 mg/dL (ref 0.0–1.2)
CO2: 25 mmol/L (ref 20–29)
Calcium: 8.8 mg/dL (ref 8.7–10.3)
Chloride: 104 mmol/L (ref 96–106)
Creatinine, Ser: 0.95 mg/dL (ref 0.57–1.00)
GFR calc Af Amer: 75 mL/min/{1.73_m2} (ref >59)
GFR calc non Af Amer: 65 mL/min/{1.73_m2} (ref >59)
Globulin, Total: 3.4 g/dL (ref 1.5–4.5)
Glucose: 100 mg/dL — ABNORMAL HIGH (ref 65–99)
Potassium: 3.8 mmol/L (ref 3.5–5.2)
Sodium: 144 mmol/L (ref 134–144)
Total Protein: 7.6 g/dL (ref 6.0–8.5)

## 2020-05-19 LAB — HEMOGLOBIN A1C
Est. average glucose Bld gHb Est-mCnc: 157 mg/dL
Hgb A1c MFr Bld: 7.1 % — ABNORMAL HIGH (ref 4.8–5.6)

## 2020-05-31 ENCOUNTER — Other Ambulatory Visit: Payer: Self-pay | Admitting: Nurse Practitioner

## 2020-05-31 DIAGNOSIS — Z780 Asymptomatic menopausal state: Secondary | ICD-10-CM

## 2020-06-02 ENCOUNTER — Other Ambulatory Visit: Payer: Self-pay | Admitting: Nurse Practitioner

## 2020-06-02 DIAGNOSIS — F32A Depression, unspecified: Secondary | ICD-10-CM

## 2020-06-05 NOTE — Telephone Encounter (Signed)
Please refill patient's prescription YL,RMA 

## 2020-06-07 DIAGNOSIS — Z1231 Encounter for screening mammogram for malignant neoplasm of breast: Secondary | ICD-10-CM | POA: Diagnosis not present

## 2020-06-07 LAB — HM MAMMOGRAPHY

## 2020-06-08 ENCOUNTER — Encounter: Payer: Self-pay | Admitting: Nurse Practitioner

## 2020-06-10 ENCOUNTER — Other Ambulatory Visit: Payer: Self-pay | Admitting: Nurse Practitioner

## 2020-06-21 ENCOUNTER — Other Ambulatory Visit: Payer: Self-pay | Admitting: Nurse Practitioner

## 2020-06-21 DIAGNOSIS — G629 Polyneuropathy, unspecified: Secondary | ICD-10-CM

## 2020-06-22 ENCOUNTER — Other Ambulatory Visit: Payer: Self-pay

## 2020-06-22 DIAGNOSIS — G629 Polyneuropathy, unspecified: Secondary | ICD-10-CM

## 2020-06-22 MED ORDER — GABAPENTIN 300 MG PO CAPS: 300.0000 mg | ORAL_CAPSULE | Freq: Two times a day (BID) | ORAL | 0 refills | Status: DC

## 2020-06-22 MED ORDER — TELMISARTAN 40 MG PO TABS
40.0000 mg | ORAL_TABLET | Freq: Every day | ORAL | 1 refills | Status: DC
Start: 2020-06-22 — End: 2020-12-11

## 2020-07-24 ENCOUNTER — Other Ambulatory Visit: Payer: Self-pay | Admitting: Nurse Practitioner

## 2020-07-24 ENCOUNTER — Encounter: Payer: Self-pay | Admitting: Nurse Practitioner

## 2020-07-24 DIAGNOSIS — G629 Polyneuropathy, unspecified: Secondary | ICD-10-CM

## 2020-07-24 DIAGNOSIS — M85851 Other specified disorders of bone density and structure, right thigh: Secondary | ICD-10-CM | POA: Diagnosis not present

## 2020-07-24 DIAGNOSIS — M85852 Other specified disorders of bone density and structure, left thigh: Secondary | ICD-10-CM | POA: Diagnosis not present

## 2020-07-24 LAB — HM DEXA SCAN

## 2020-08-15 DIAGNOSIS — Z86010 Personal history of colonic polyps: Secondary | ICD-10-CM | POA: Diagnosis not present

## 2020-08-15 DIAGNOSIS — K219 Gastro-esophageal reflux disease without esophagitis: Secondary | ICD-10-CM | POA: Diagnosis not present

## 2020-08-15 DIAGNOSIS — E669 Obesity, unspecified: Secondary | ICD-10-CM | POA: Diagnosis not present

## 2020-08-15 DIAGNOSIS — Z1211 Encounter for screening for malignant neoplasm of colon: Secondary | ICD-10-CM | POA: Diagnosis not present

## 2020-08-22 ENCOUNTER — Other Ambulatory Visit: Payer: Self-pay | Admitting: Nurse Practitioner

## 2020-08-22 DIAGNOSIS — M8580 Other specified disorders of bone density and structure, unspecified site: Secondary | ICD-10-CM

## 2020-08-23 ENCOUNTER — Other Ambulatory Visit: Payer: Self-pay

## 2020-08-23 ENCOUNTER — Ambulatory Visit (INDEPENDENT_AMBULATORY_CARE_PROVIDER_SITE_OTHER): Payer: 59 | Admitting: Nurse Practitioner

## 2020-08-23 ENCOUNTER — Encounter: Payer: Self-pay | Admitting: Nurse Practitioner

## 2020-08-23 VITALS — BP 124/80 | HR 64 | Temp 97.8°F | Ht 64.8 in | Wt 204.0 lb

## 2020-08-23 DIAGNOSIS — E6609 Other obesity due to excess calories: Secondary | ICD-10-CM | POA: Diagnosis not present

## 2020-08-23 DIAGNOSIS — Z6834 Body mass index (BMI) 34.0-34.9, adult: Secondary | ICD-10-CM | POA: Diagnosis not present

## 2020-08-23 DIAGNOSIS — I10 Essential (primary) hypertension: Secondary | ICD-10-CM | POA: Diagnosis not present

## 2020-08-23 DIAGNOSIS — E1169 Type 2 diabetes mellitus with other specified complication: Secondary | ICD-10-CM

## 2020-08-23 MED ORDER — RYBELSUS 7 MG PO TABS: 1.0000 | ORAL_TABLET | Freq: Every day | ORAL | 0 refills | Status: DC

## 2020-08-23 NOTE — Progress Notes (Signed)
I,Yamilka Roman [employer] as a Education administrator for [employer], FNP.,have documented all relevant documentation on the behalf of [name], FNP,as directed by  [name], FNP while in the presence of [name], [place]. This visit occurred during the SARS-CoV-2 public health emergency.  Safety protocols were in place, including screening questions prior to the visit, additional usage of staff PPE, and extensive cleaning of exam room while observing appropriate contact time as indicated for disinfecting solutions.  Subjective:     Patient ID: Danielle Calderon , female    DOB: 1961/05/01 , 60 y.o.   MRN: [id number]   Chief Complaint  Patient presents with  . Diabetes  . Hypertension    HPI  Patient here for a f/u on her prediabetes and blood pressure.  Wt Readings from Last 3 Encounters: 08/23/20 : 204 lb (92.5 kg) 05/18/20 : 214 lb 6.4 oz (97.3 kg) 05/01/20 : 216 lb 3.2 oz (98.1 kg)  She takes her blood pressure medication at night. She had flounder last night at a restaurant. She is walking 3 times a week.  She is drinking more water.  She is having her Colonoscopy on March 23rd with Dr. Collene Mares  Diabetes She presents for her follow-up diabetic visit. She has type 2 diabetes mellitus. Her disease course has been stable. Pertinent negatives for hypoglycemia include no confusion, headaches or nervousness/anxiousness. Pertinent negatives for diabetes include no chest pain, no polydipsia, no polyphagia and no polyuria. There are no hypoglycemic complications. There are no diabetic complications. Risk factors for coronary artery disease include obesity. Current diabetic treatment includes oral agent (monotherapy). She is compliant with treatment all of the time. She is following a generally healthy (she is doing better with her diet) diet. When asked about meal planning, she reported none. She has not had a previous visit with a dietitian. She participates in exercise three times a week. (Not checking  due to not wanting to stick her finger) An ACE inhibitor/angiotensin II receptor blocker is being taken. She does not see a podiatrist.Eye exam is current.  Hypertension This is a chronic problem. The current episode started more than 1 year ago. The problem is unchanged. The problem is uncontrolled (has not taken her medication today). Pertinent negatives include no anxiety, chest pain, headaches or shortness of breath. There are no associated agents to hypertension. Risk factors for coronary artery disease include obesity and sedentary lifestyle. Past treatments include angiotensin blockers. Compliance problems include exercise and diet.  There is no history of kidney disease. There is no history of chronic renal disease.     Past Medical History:  Diagnosis Date  . Anxiety   . Depression   . Diabetes mellitus without complication ([place])    Type II  . GERD (gastroesophageal reflux disease)   . Headache    mild daily takes gabapentin  . History of migraine headaches   . Hyperlipidemia   . Hypertension      Family History  Problem Relation Age of Onset  . Hypertension Mother   . Hypertension Father   . Cancer Father   . Diabetes Father   . Breast cancer Maternal Aunt   . AAA (abdominal aortic aneurysm) Maternal Aunt   . Cancer Maternal Great-grandmother        cervical vs. uterine (sounds like cervical, treated with cryotherapy)  . Breast cancer Cousin        maternal     Current Outpatient Medications:  .  alendronate (FOSAMAX) 70 MG tablet, TAKE 1  TABLET BY MOUTH EVERY 7 DAYS. TAKE WITH A FULL GLASS OF WATER ON AN EMPTY STOMACH, Disp: 4 tablet, Rfl: 2 .  calcium carbonate 1250 MG capsule, Take 1,200 mg by mouth at bedtime. , Disp: , Rfl:  .  cholecalciferol (VITAMIN D) 1000 UNITS tablet, Take 2,000 Units by mouth at bedtime. , Disp: , Rfl:  .  citalopram (CELEXA) 40 MG tablet, Take 1 tablet (40 mg total) by mouth at bedtime., Disp: 90 tablet, Rfl: 1 .  diphenhydrAMINE  (SOMINEX) 25 MG tablet, Take 25 mg by mouth at bedtime as needed for allergies. , Disp: , Rfl:  .  gabapentin (NEURONTIN) 300 MG capsule, TAKE 1 CAPSULE BY MOUTH TWICE A DAY, Disp: 60 capsule, Rfl: 0 .  ibuprofen (ADVIL) 600 MG tablet, Take 1 tablet (600 mg total) by mouth every 6 (six) hours as needed for headache, mild pain, moderate pain or cramping., Disp: 30 tablet, Rfl: 1 .  metFORMIN (GLUCOPHAGE) 500 MG tablet, TAKE 1 TABLET BY MOUTH 2 TIMES DAILY WITH A MEAL., Disp: 60 tablet, Rfl: 2 .  omeprazole (PRILOSEC) 20 MG capsule, Take 20 mg by mouth daily., Disp: , Rfl:  .  rosuvastatin (CRESTOR) 5 MG tablet, Take 1 tablet (5 mg total) by mouth at bedtime., Disp: 90 tablet, Rfl: 1 .  senna-docusate (SENOKOT-S) 8.6-50 MG tablet, Take 2 tablets by mouth at bedtime. For AFTER surgery, do not take if having diarrhea, Disp: 30 tablet, Rfl: 0 .  telmisartan (MICARDIS) 40 MG tablet, Take 1 tablet (40 mg total) by mouth daily., Disp: 90 tablet, Rfl: 1 .  ACCU-CHEK GUIDE test strip, TEST UP TO 4 TIMES A DAY AS DIRECTED (Patient not taking: Reported on 08/23/2020), Disp: 300 strip, Rfl: 2 .  blood glucose meter kit and supplies KIT, Dispense based on patient and insurance preference. Use up to four times daily as directed. (FOR ICD-9 250.00, 250.01). (Patient not taking: Reported on 08/23/2020), Disp: 1 each, Rfl: 0 .  Semaglutide (RYBELSUS) 7 MG TABS, Take 1 tablet by mouth daily., Disp: 30 tablet, Rfl: 0   Allergies  Allergen Reactions  . Lisinopril Cough    Other reaction(s): Cough (ALLERGY/intolerance)     Review of Systems  Respiratory: Negative for shortness of breath.   Cardiovascular: Negative for chest pain.  Endocrine: Negative for polydipsia, polyphagia and polyuria.  Neurological: Negative for headaches.  Psychiatric/Behavioral: Negative for confusion. The patient is not nervous/anxious.      Today's Vitals   08/23/20 0939  BP: 124/80  Pulse: 64  Temp: 97.8 F (36.6 C)  TempSrc: Oral   Weight: 204 lb (92.5 kg)  Height: 5' 4.8" (1.646 m)  PainSc: 0-No pain   Body mass index is 34.16 kg/m.   Objective:  Physical Exam Constitutional:      General: She is not in acute distress.    Appearance: Normal appearance. She is obese.  Cardiovascular:     Rate and Rhythm: Normal rate and regular rhythm.     Pulses: Normal pulses.     Heart sounds: Normal heart sounds.  Pulmonary:     Effort: Pulmonary effort is normal.     Breath sounds: Normal breath sounds.  Abdominal:     General: Abdomen is flat. Bowel sounds are normal.     Palpations: Abdomen is soft.  Musculoskeletal:        General: Normal range of motion.     Cervical back: Normal range of motion and neck supple.  Skin:    General:  Skin is warm and dry.     Capillary Refill: Capillary refill takes less than 2 seconds.  Neurological:     General: No focal deficit present.     Mental Status: She is alert and oriented to person, place, and time.  Psychiatric:        Mood and Affect: Mood normal.        Behavior: Behavior normal.        Thought Content: Thought content normal.        Judgment: Judgment normal.         Assessment And Plan:     1. Essential hypertension  Chronic, blood pressure is controlled  Continue with current medications - BMP8+eGFR  2. Type 2 diabetes mellitus with other specified complication, without long-term current use of insulin (HCC)  Chronic, HgbA1c was slightly up at last visit  She is doing more activity and has lost approximately 10 lbs since her last visit hopefully this will show an improvement in her HgbA1c.  rybelsus samples given with instructions to take 30 minutes before breakfast and if she is full to stop eating may cause nausea.  If has constipation take stool softner 3 days a week. - Hemoglobin A1c - Semaglutide (RYBELSUS) 7 MG TABS; Take 1 tablet by mouth daily.  Dispense: 30 tablet; Refill: 0  3. Class 1 obesity due to excess calories without serious  comorbidity with body mass index (BMI) of 34.0 to 34.9 in adult Congratulated on her 10 lb weight loss Continue with increasing physical activity to 150 minutes week and eating healthy diet   Patient was given opportunity to ask questions. Patient verbalized understanding of the plan and was able to repeat key elements of the plan. All questions were answered to their satisfaction.  [name], FNP   I, [name], FNP, have reviewed all documentation for this visit. The documentation on 08/23/20 for the exam, diagnosis, procedures, and orders are all accurate and complete.   THE PATIENT IS ENCOURAGED TO PRACTICE SOCIAL DISTANCING DUE TO THE COVID-19 PANDEMIC.

## 2020-08-23 NOTE — Patient Instructions (Signed)

## 2020-08-24 LAB — BMP8+EGFR
BUN/Creatinine Ratio: 9 — ABNORMAL LOW (ref 12–28)
BUN: 8 mg/dL (ref 8–27)
CO2: 23 mmol/L (ref 20–29)
Calcium: 8.8 mg/dL (ref 8.7–10.3)
Chloride: 100 mmol/L (ref 96–106)
Creatinine, Ser: 0.88 mg/dL (ref 0.57–1.00)
GFR calc Af Amer: 82 mL/min/{1.73_m2} (ref >59)
GFR calc non Af Amer: 71 mL/min/{1.73_m2} (ref >59)
Glucose: 110 mg/dL — ABNORMAL HIGH (ref 65–99)
Potassium: 3.9 mmol/L (ref 3.5–5.2)
Sodium: 139 mmol/L (ref 134–144)

## 2020-08-24 LAB — HEMOGLOBIN A1C
Est. average glucose Bld gHb Est-mCnc: 140 mg/dL
Hgb A1c MFr Bld: 6.5 % — ABNORMAL HIGH (ref 4.8–5.6)

## 2020-09-16 ENCOUNTER — Other Ambulatory Visit: Payer: Self-pay | Admitting: Nurse Practitioner

## 2020-09-25 ENCOUNTER — Encounter: Payer: Self-pay | Admitting: Nurse Practitioner

## 2020-09-25 DIAGNOSIS — D127 Benign neoplasm of rectosigmoid junction: Secondary | ICD-10-CM | POA: Diagnosis not present

## 2020-09-25 DIAGNOSIS — Z1211 Encounter for screening for malignant neoplasm of colon: Secondary | ICD-10-CM | POA: Diagnosis not present

## 2020-09-25 DIAGNOSIS — D122 Benign neoplasm of ascending colon: Secondary | ICD-10-CM | POA: Diagnosis not present

## 2020-09-25 DIAGNOSIS — K635 Polyp of colon: Secondary | ICD-10-CM | POA: Diagnosis not present

## 2020-09-25 LAB — HM COLONOSCOPY

## 2020-09-27 ENCOUNTER — Encounter: Payer: Self-pay | Admitting: Nurse Practitioner

## 2020-09-27 ENCOUNTER — Ambulatory Visit (INDEPENDENT_AMBULATORY_CARE_PROVIDER_SITE_OTHER): Payer: 59 | Admitting: Nurse Practitioner

## 2020-09-27 ENCOUNTER — Other Ambulatory Visit: Payer: Self-pay

## 2020-09-27 VITALS — BP 118/84 | HR 61 | Temp 98.0°F | Ht 64.8 in | Wt 198.0 lb

## 2020-09-27 DIAGNOSIS — E1169 Type 2 diabetes mellitus with other specified complication: Secondary | ICD-10-CM

## 2020-09-27 MED ORDER — RYBELSUS 14 MG PO TABS: 1.0000 | ORAL_TABLET | Freq: Every day | ORAL | 1 refills | Status: DC

## 2020-09-27 NOTE — Progress Notes (Signed)
Rutherford Nail as a scribe for [name], FNP.,have documented all relevant documentation on the behalf of [name], FNP,as directed by  [name], FNP while in the presence of [name], [place]. This visit occurred during the SARS-CoV-2 public health emergency.  Safety protocols were in place, including screening questions prior to the visit, additional usage of staff PPE, and extensive cleaning of exam room while observing appropriate contact time as indicated for disinfecting solutions.  Subjective:     Patient ID: Danielle Calderon , female    DOB: [birth year] , 60 y.o.   MRN: [id number]   Chief Complaint  Patient presents with  . Diabetes  . Hypertension    HPI  Patient here for a f/u on her diabetes. She started Rybelsus after her last visit. She is tolerating Rybelsus well, denies nausea or constipation.  She continues to walking 5 days a week.    Wt Readings from Last 3 Encounters: 09/27/20 : 198 lb (89.8 kg) 08/23/20 : 204 lb (92.5 kg) 05/18/20 : 214 lb 6.4 oz (97.3 kg)   Diabetes She presents for her follow-up diabetic visit. She has type 2 diabetes mellitus. Her disease course has been stable. Pertinent negatives for hypoglycemia include no confusion, dizziness, headaches or nervousness/anxiousness. Pertinent negatives for diabetes include no chest pain, no fatigue, no polydipsia, no polyphagia and no polyuria. There are no hypoglycemic complications. There are no diabetic complications. Risk factors for coronary artery disease include obesity. Current diabetic treatment includes oral agent (monotherapy). She is compliant with treatment all of the time. She is following a generally healthy (she is doing better with her diet) diet. When asked about meal planning, she reported none. She has not had a previous visit with a dietitian. She participates in exercise three times a week. (Not checking due to not wanting to stick her finger) An ACE inhibitor/angiotensin II  receptor blocker is being taken. She does not see a podiatrist.Eye exam is current.  Hypertension This is a chronic problem. The current episode started more than 1 year ago. The problem is unchanged. The problem is uncontrolled (has not taken her medication today). Pertinent negatives include no anxiety, chest pain, headaches, palpitations or shortness of breath. There are no associated agents to hypertension. Risk factors for coronary artery disease include obesity and sedentary lifestyle. Past treatments include angiotensin blockers. Compliance problems include exercise and diet.  There is no history of kidney disease. There is no history of chronic renal disease.     Past Medical History:  Diagnosis Date  . Anxiety   . Depression   . Diabetes mellitus without complication ([place])    Type II  . GERD (gastroesophageal reflux disease)   . Headache    mild daily takes gabapentin  . History of migraine headaches   . Hyperlipidemia   . Hypertension      Family History  Problem Relation Age of Onset  . Hypertension Mother   . Hypertension Father   . Cancer Father   . Diabetes Father   . Breast cancer Maternal Aunt   . AAA (abdominal aortic aneurysm) Maternal Aunt   . Cancer Maternal Great-grandmother        cervical vs. uterine (sounds like cervical, treated with cryotherapy)  . Breast cancer Cousin        maternal     Current Outpatient Medications:  .  alendronate (FOSAMAX) 70 MG tablet, TAKE 1 TABLET BY MOUTH EVERY 7 DAYS. TAKE WITH A FULL GLASS OF WATER ON AN  EMPTY STOMACH, Disp: 4 tablet, Rfl: 2 .  calcium carbonate 1250 MG capsule, Take 1,200 mg by mouth at bedtime. , Disp: , Rfl:  .  cholecalciferol (VITAMIN D) 1000 UNITS tablet, Take 2,000 Units by mouth at bedtime. , Disp: , Rfl:  .  citalopram (CELEXA) 40 MG tablet, Take 1 tablet (40 mg total) by mouth at bedtime., Disp: 90 tablet, Rfl: 1 .  diphenhydrAMINE (SOMINEX) 25 MG tablet, Take 25 mg by mouth at bedtime as needed  for allergies. , Disp: , Rfl:  .  gabapentin (NEURONTIN) 300 MG capsule, TAKE 1 CAPSULE BY MOUTH TWICE A DAY, Disp: 60 capsule, Rfl: 0 .  metFORMIN (GLUCOPHAGE) 500 MG tablet, TAKE 1 TABLET BY MOUTH 2 TIMES DAILY WITH A MEAL., Disp: 60 tablet, Rfl: 2 .  omeprazole (PRILOSEC) 20 MG capsule, Take 20 mg by mouth daily., Disp: , Rfl:  .  rosuvastatin (CRESTOR) 5 MG tablet, Take 1 tablet (5 mg total) by mouth at bedtime., Disp: 90 tablet, Rfl: 1 .  Semaglutide (RYBELSUS) 14 MG TABS, Take 1 tablet by mouth daily. Take 30 minutes before breakfast, Disp: 90 tablet, Rfl: 1 .  telmisartan (MICARDIS) 40 MG tablet, Take 1 tablet (40 mg total) by mouth daily., Disp: 90 tablet, Rfl: 1   Allergies  Allergen Reactions  . Lisinopril Cough    Other reaction(s): Cough (ALLERGY/intolerance)     Review of Systems  Constitutional: Negative.  Negative for fatigue.  HENT: Negative.   Respiratory: Negative for shortness of breath.   Cardiovascular: Negative for chest pain, palpitations and leg swelling.  Endocrine: Negative for polydipsia, polyphagia and polyuria.  Musculoskeletal: Negative.   Skin: Negative.   Neurological: Negative for dizziness and headaches.  Psychiatric/Behavioral: Negative.  Negative for confusion. The patient is not nervous/anxious.      Today's Vitals   09/27/20 1115  BP: 118/84  Pulse: 61  Temp: 98 F (36.7 C)  TempSrc: Oral  Weight: 198 lb (89.8 kg)  Height: 5' 4.8" (1.646 m)   Body mass index is 33.15 kg/m.  Wt Readings from Last 3 Encounters:  09/27/20 198 lb (89.8 kg)  08/23/20 204 lb (92.5 kg)  05/18/20 214 lb 6.4 oz (97.3 kg)   Objective:  Physical Exam Vitals reviewed.  Constitutional:      General: She is not in acute distress.    Appearance: Normal appearance. She is obese.  HENT:     Head: Normocephalic.     Right Ear: Tympanic membrane, ear canal and external ear normal. There is no impacted cerumen.     Left Ear: Tympanic membrane, ear canal and  external ear normal. There is no impacted cerumen.     Nose: Nose normal. No congestion.     Mouth/Throat:     Mouth: Mucous membranes are moist.  Eyes:     Extraocular Movements: Extraocular movements intact.     Conjunctiva/sclera: Conjunctivae normal.     Pupils: Pupils are equal, round, and reactive to light.  Cardiovascular:     Rate and Rhythm: Normal rate and regular rhythm.     Pulses: Normal pulses.     Heart sounds: Normal heart sounds. No murmur heard.   Pulmonary:     Effort: Pulmonary effort is normal. No respiratory distress.     Breath sounds: Normal breath sounds. No wheezing.  Skin:    General: Skin is warm and dry.     Capillary Refill: Capillary refill takes less than 2 seconds.  Neurological:  General: No focal deficit present.     Mental Status: She is alert and oriented to person, place, and time.     Cranial Nerves: No cranial nerve deficit.  Psychiatric:        Mood and Affect: Mood normal.        Behavior: Behavior normal.        Thought Content: Thought content normal.        Judgment: Judgment normal.         Assessment And Plan:     1. Type 2 diabetes mellitus with other specified complication, without long-term current use of insulin (HCC)  Chronic, she started Rybelsus after last visit and is doing well.   I will increase to 14 mg since she is tolerating well  She has had a 6 lb weight loss since her last visit as well. - Semaglutide (RYBELSUS) 14 MG TABS; Take 1 tablet by mouth daily. Take 30 minutes before breakfast  Dispense: 90 tablet; Refill: 1 She is encouraged to strive for BMI less than 30 to decrease cardiac risk. Advised to aim for at least 150 minutes of exercise per week.    Patient was given opportunity to ask questions. Patient verbalized understanding of the plan and was able to repeat key elements of the plan. All questions were answered to their satisfaction.  [name], FNP   I, [name], FNP, have reviewed all  documentation for this visit. The documentation on 09/27/20 for the exam, diagnosis, procedures, and orders are all accurate and complete.   IF YOU HAVE BEEN REFERRED TO A SPECIALIST, IT MAY TAKE 1-2 WEEKS TO SCHEDULE/PROCESS THE REFERRAL. IF YOU HAVE NOT HEARD FROM US/SPECIALIST IN TWO WEEKS, PLEASE GIVE [place] A CALL AT [phone].   THE PATIENT IS ENCOURAGED TO PRACTICE SOCIAL DISTANCING DUE TO THE COVID-19 PANDEMIC.

## 2020-09-27 NOTE — Patient Instructions (Signed)
Hypertension, Adult Hypertension is another name for high blood pressure. High blood pressure forces your heart to work harder to pump blood. This can cause problems over time. There are two numbers in a blood pressure reading. There is a top number (systolic) over a bottom number (diastolic). It is best to have a blood pressure that is below 120/80. Healthy choices can help lower your blood pressure, or you may need medicine to help lower it. What are the causes? The cause of this condition is not known. Some conditions may be related to high blood pressure. What increases the risk?  Smoking.  Having type 2 diabetes mellitus, high cholesterol, or both.  Not getting enough exercise or physical activity.  Being overweight.  Having too much fat, sugar, calories, or salt (sodium) in your diet.  Drinking too much alcohol.  Having long-term (chronic) kidney disease.  Having a family history of high blood pressure.  Age. Risk increases with age.  Race. You may be at higher risk if you are African American.  Gender. Men are at higher risk than women before age 45. After age 65, women are at higher risk than men.  Having obstructive sleep apnea.  Stress. What are the signs or symptoms?  High blood pressure may not cause symptoms. Very high blood pressure (hypertensive crisis) may cause: ? Headache. ? Feelings of worry or nervousness (anxiety). ? Shortness of breath. ? Nosebleed. ? A feeling of being sick to your stomach (nausea). ? Throwing up (vomiting). ? Changes in how you see. ? Very bad chest pain. ? Seizures. How is this treated?  This condition is treated by making healthy lifestyle changes, such as: ? Eating healthy foods. ? Exercising more. ? Drinking less alcohol.  Your health care provider may prescribe medicine if lifestyle changes are not enough to get your blood pressure under control, and if: ? Your top number is above 130. ? Your bottom number is above  80.  Your personal target blood pressure may vary. Follow these instructions at home: Eating and drinking  If told, follow the DASH eating plan. To follow this plan: ? Fill one half of your plate at each meal with fruits and vegetables. ? Fill one fourth of your plate at each meal with whole grains. Whole grains include whole-wheat pasta, brown rice, and whole-grain bread. ? Eat or drink low-fat dairy products, such as skim milk or low-fat yogurt. ? Fill one fourth of your plate at each meal with low-fat (lean) proteins. Low-fat proteins include fish, chicken without skin, eggs, beans, and tofu. ? Avoid fatty meat, cured and processed meat, or chicken with skin. ? Avoid pre-made or processed food.  Eat less than 1,500 mg of salt each day.  Do not drink alcohol if: ? Your doctor tells you not to drink. ? You are pregnant, may be pregnant, or are planning to become pregnant.  If you drink alcohol: ? Limit how much you use to:  0-1 drink a day for women.  0-2 drinks a day for men. ? Be aware of how much alcohol is in your drink. In the [place] one drink equals one 12 oz bottle of beer (355 mL), one 5 oz glass of wine (148 mL), or one 1 oz glass of hard liquor (44 mL).   Lifestyle  Work with your doctor to stay at a healthy weight or to lose weight. Ask your doctor what the best weight is for you.  Get at least 30 minutes of exercise most   days of the week. This may include walking, swimming, or biking.  Get at least 30 minutes of exercise that strengthens your muscles (resistance exercise) at least 3 days a week. This may include lifting weights or doing Pilates.  Do not use any products that contain nicotine or tobacco, such as cigarettes, e-cigarettes, and chewing tobacco. If you need help quitting, ask your doctor.  Check your blood pressure at home as told by your doctor.  Keep all follow-up visits as told by your doctor. This is important.   Medicines  Take over-the-counter  and prescription medicines only as told by your doctor. Follow directions carefully.  Do not skip doses of blood pressure medicine. The medicine does not work as well if you skip doses. Skipping doses also puts you at risk for problems.  Ask your doctor about side effects or reactions to medicines that you should watch for. Contact a doctor if you:  Think you are having a reaction to the medicine you are taking.  Have headaches that keep coming back (recurring).  Feel dizzy.  Have swelling in your ankles.  Have trouble with your vision. Get help right away if you:  Get a very bad headache.  Start to feel mixed up (confused).  Feel weak or numb.  Feel faint.  Have very bad pain in your: ? Chest. ? Belly (abdomen).  Throw up more than once.  Have trouble breathing. Summary  Hypertension is another name for high blood pressure.  High blood pressure forces your heart to work harder to pump blood.  For most people, a normal blood pressure is less than 120/80.  Making healthy choices can help lower blood pressure. If your blood pressure does not get lower with healthy choices, you may need to take medicine. This information is not intended to replace advice given to you by your health care provider. Make sure you discuss any questions you have with your health care provider. Document Revised: 02/25/2018 Document Reviewed: 02/25/2018 Elsevier Patient Education  [place]. Diabetes Mellitus and Exercise Exercising regularly is important for overall health, especially for people who have diabetes mellitus. Exercising is not only about losing weight. It has many other health benefits, such as increasing muscle strength and bone density and reducing body fat and stress. This leads to improved fitness, flexibility, and endurance, all of which result in better overall health. What are the benefits of exercise if I have diabetes? Exercise has many benefits for people with  diabetes. They include:  Helping to lower and control blood sugar (glucose).  Helping the body to respond better to the hormone insulin by improving insulin sensitivity.  Reducing how much insulin the body needs.  Lowering the risk for heart disease by: ? Lowering "bad" cholesterol and triglyceride levels. ? Increasing "good" cholesterol levels. ? Lowering blood pressure. ? Lowering blood glucose levels. What is my activity plan? Your health care provider or certified diabetes educator can help you make a plan for the type and frequency of exercise that works for you. This is called your activity plan. Be sure to:  Get at least 150 minutes of medium-intensity or high-intensity exercise each week. Exercises may include brisk walking, biking, or water aerobics.  Do stretching and strengthening exercises, such as yoga or weight lifting, at least 2 times a week.  Spread out your activity over at least 3 days of the week.  Get some form of physical activity each day. ? Do not go more than 2 days in  a row without some kind of physical activity. ? Avoid being inactive for more than 90 minutes at a time. Take frequent breaks to walk or stretch.  Choose exercises or activities that you enjoy. Set realistic goals.  Start slowly and gradually increase your exercise intensity over time.   How do I manage my diabetes during exercise? Monitor your blood glucose  Check your blood glucose before and after exercising. If your blood glucose is: ? 240 mg/dL (13.3 mmol/L) or higher before you exercise, check your urine for ketones. These are chemicals created by the liver. If you have ketones in your urine, do not exercise until your blood glucose returns to normal. ? 100 mg/dL (5.6 mmol/L) or lower, eat a snack containing 15-20 grams of carbohydrate. Check your blood glucose 15 minutes after the snack to make sure that your glucose level is above 100 mg/dL (5.6 mmol/L) before you start your  exercise.  Know the symptoms of low blood glucose (hypoglycemia) and how to treat it. Your risk for hypoglycemia increases during and after exercise. Follow these tips and your health care provider's instructions  Keep a carbohydrate snack that is fast-acting for use before, during, and after exercise to help prevent or treat hypoglycemia.  Avoid injecting insulin into areas of the body that are going to be exercised. For example, avoid injecting insulin into: ? Your arms, when you are about to play tennis. ? Your legs, when you are about to go jogging.  Keep records of your exercise habits. Doing this can help you and your health care provider adjust your diabetes management plan as needed. Write down: ? Food that you eat before and after you exercise. ? Blood glucose levels before and after you exercise. ? The type and amount of exercise you have done.  Work with your health care provider when you start a new exercise or activity. He or she may need to: ? Make sure that the activity is safe for you. ? Adjust your insulin, other medicines, and food that you eat.  Drink plenty of water while you exercise. This prevents loss of water (dehydration) and problems caused by a lot of heat in the body (heat stroke).   Where to find more information  American Diabetes Association: www.diabetes.org Summary  Exercising regularly is important for overall health, especially for people who have diabetes mellitus.  Exercising has many health benefits. It increases muscle strength and bone density and reduces body fat and stress. It also lowers and controls blood glucose.  Your health care provider or certified diabetes educator can help you make an activity plan for the type and frequency of exercise that works for you.  Work with your health care provider to make sure any new activity is safe for you. Also work with your health care provider to adjust your insulin, other medicines, and the food  you eat. This information is not intended to replace advice given to you by your health care provider. Make sure you discuss any questions you have with your health care provider. Document Revised: 03/15/2019 Document Reviewed: 03/15/2019 Elsevier Patient Education  [place].

## 2020-09-28 ENCOUNTER — Encounter: Payer: Self-pay | Admitting: Nurse Practitioner

## 2020-11-10 ENCOUNTER — Other Ambulatory Visit: Payer: Self-pay | Admitting: Nurse Practitioner

## 2020-11-10 DIAGNOSIS — Z780 Asymptomatic menopausal state: Secondary | ICD-10-CM

## 2020-11-12 ENCOUNTER — Other Ambulatory Visit: Payer: Self-pay | Admitting: Nurse Practitioner

## 2020-11-12 DIAGNOSIS — E782 Mixed hyperlipidemia: Secondary | ICD-10-CM

## 2020-11-15 ENCOUNTER — Other Ambulatory Visit: Payer: Self-pay | Admitting: Nurse Practitioner

## 2020-11-15 DIAGNOSIS — G629 Polyneuropathy, unspecified: Secondary | ICD-10-CM

## 2020-11-20 ENCOUNTER — Encounter: Payer: Self-pay | Admitting: Nurse Practitioner

## 2020-11-20 ENCOUNTER — Ambulatory Visit (INDEPENDENT_AMBULATORY_CARE_PROVIDER_SITE_OTHER): Payer: Managed Care, Other (non HMO) | Admitting: Nurse Practitioner

## 2020-11-20 ENCOUNTER — Other Ambulatory Visit: Payer: Self-pay

## 2020-11-20 VITALS — BP 132/80 | HR 65 | Temp 98.1°F | Ht 64.4 in | Wt 186.8 lb

## 2020-11-20 DIAGNOSIS — E669 Obesity, unspecified: Secondary | ICD-10-CM | POA: Diagnosis not present

## 2020-11-20 DIAGNOSIS — I10 Essential (primary) hypertension: Secondary | ICD-10-CM

## 2020-11-20 DIAGNOSIS — Z6831 Body mass index (BMI) 31.0-31.9, adult: Secondary | ICD-10-CM

## 2020-11-20 DIAGNOSIS — E782 Mixed hyperlipidemia: Secondary | ICD-10-CM

## 2020-11-20 DIAGNOSIS — E1169 Type 2 diabetes mellitus with other specified complication: Secondary | ICD-10-CM

## 2020-11-20 DIAGNOSIS — R21 Rash and other nonspecific skin eruption: Secondary | ICD-10-CM | POA: Diagnosis not present

## 2020-11-20 NOTE — Patient Instructions (Signed)

## 2020-11-20 NOTE — Progress Notes (Signed)
I,Yamilka Roman [employer] as a Education administrator for [employer], FNP.,have documented all relevant documentation on the behalf of [name], FNP,as directed by  [name], FNP while in the presence of [name], [place]. This visit occurred during the SARS-CoV-2 public health emergency.  Safety protocols were in place, including screening questions prior to the visit, additional usage of staff PPE, and extensive cleaning of exam room while observing appropriate contact time as indicated for disinfecting solutions.  Subjective:     Patient ID: Danielle Calderon , female    DOB: May 30, 1961 , 60 y.o.   MRN: [id number]   Chief Complaint  Patient presents with  . Diabetes  . Hypertension    HPI  Patient here for a f/u on her diabetes and blood pressure. Denies any side effects to include constipation or nausea. She does report having noticed her curbed appetite.   Wt Readings from Last 3 Encounters: 11/20/20 : 186 lb 12.8 oz (84.7 kg) 09/27/20 : 198 lb (89.8 kg) 08/23/20 : 204 lb (92.5 kg)  She has not been exercising as much after her dog died earlier this year.   Diabetes She presents for her follow-up diabetic visit. She has type 2 diabetes mellitus. Her disease course has been stable. Pertinent negatives for hypoglycemia include no confusion, dizziness, headaches or nervousness/anxiousness. Pertinent negatives for diabetes include no chest pain, no polydipsia, no polyphagia and no polyuria. There are no hypoglycemic complications. There are no diabetic complications. Risk factors for coronary artery disease include obesity. Current diabetic treatment includes oral agent (monotherapy). She is compliant with treatment all of the time. She is following a generally healthy (she is doing better with her diet) diet. When asked about meal planning, she reported none. She has not had a previous visit with a dietitian. She participates in exercise three times a week. (Not checking due to not wanting to  stick her finger) An ACE inhibitor/angiotensin II receptor blocker is being taken. She does not see a podiatrist.Eye exam is current.  Hypertension This is a chronic problem. The current episode started more than 1 year ago. The problem is unchanged. The problem is uncontrolled (has not taken her medication today). Pertinent negatives include no anxiety, chest pain, headaches or palpitations. There are no associated agents to hypertension. Risk factors for coronary artery disease include obesity and sedentary lifestyle. Past treatments include angiotensin blockers. Compliance problems include exercise and diet.  There is no history of kidney disease. There is no history of chronic renal disease.     Past Medical History:  Diagnosis Date  . Anxiety   . Depression   . Diabetes mellitus without complication ([place])    Type II  . GERD (gastroesophageal reflux disease)   . Headache    mild daily takes gabapentin  . History of migraine headaches   . Hyperlipidemia   . Hypertension      Family History  Problem Relation Age of Onset  . Hypertension Mother   . Hypertension Father   . Cancer Father   . Diabetes Father   . Breast cancer Maternal Aunt   . AAA (abdominal aortic aneurysm) Maternal Aunt   . Cancer Maternal Great-grandmother        cervical vs. uterine (sounds like cervical, treated with cryotherapy)  . Breast cancer Cousin        maternal     Current Outpatient Medications:  .  alendronate (FOSAMAX) 70 MG tablet, TAKE 1 TABLET BY MOUTH EVERY 7 DAYS. TAKE WITH A FULL GLASS  OF WATER ON AN EMPTY STOMACH, Disp: 4 tablet, Rfl: 2 .  calcium carbonate 1250 MG capsule, Take 1,200 mg by mouth at bedtime. , Disp: , Rfl:  .  cholecalciferol (VITAMIN D) 1000 UNITS tablet, Take 2,000 Units by mouth at bedtime. , Disp: , Rfl:  .  citalopram (CELEXA) 40 MG tablet, Take 1 tablet (40 mg total) by mouth at bedtime., Disp: 90 tablet, Rfl: 1 .  diphenhydrAMINE (SOMINEX) 25 MG tablet, Take 25 mg by  mouth at bedtime as needed for allergies. , Disp: , Rfl:  .  gabapentin (NEURONTIN) 300 MG capsule, TAKE 1 CAPSULE BY MOUTH TWICE A DAY, Disp: 60 capsule, Rfl: 0 .  metFORMIN (GLUCOPHAGE) 500 MG tablet, TAKE 1 TABLET BY MOUTH 2 TIMES DAILY WITH A MEAL., Disp: 60 tablet, Rfl: 2 .  omeprazole (PRILOSEC) 20 MG capsule, Take 20 mg by mouth daily., Disp: , Rfl:  .  rosuvastatin (CRESTOR) 5 MG tablet, TAKE 1 TABLET BY MOUTH EVERYDAY AT BEDTIME, Disp: 30 tablet, Rfl: 5 .  Semaglutide (RYBELSUS) 14 MG TABS, Take 1 tablet by mouth daily. Take 30 minutes before breakfast, Disp: 90 tablet, Rfl: 1 .  telmisartan (MICARDIS) 40 MG tablet, Take 1 tablet (40 mg total) by mouth daily., Disp: 90 tablet, Rfl: 1   Allergies  Allergen Reactions  . Lisinopril Cough    Other reaction(s): Cough (ALLERGY/intolerance)     Review of Systems  Respiratory: Negative.   Cardiovascular: Negative for chest pain, palpitations and leg swelling.  Endocrine: Negative for polydipsia, polyphagia and polyuria.  Skin: Positive for rash (left chest with circular rash, no moisture has been using lotrimin cream).  Neurological: Negative for dizziness and headaches.  Psychiatric/Behavioral: Negative for confusion. The patient is not nervous/anxious.      Today's Vitals   11/20/20 1128  BP: 132/80  Pulse: 65  Temp: 98.1 F (36.7 C)  TempSrc: Oral  Weight: 186 lb 12.8 oz (84.7 kg)  Height: 5' 4.4" (1.636 m)  PainSc: 0-No pain   Body mass index is 31.67 kg/m.   Objective:  Physical Exam Vitals reviewed.  Constitutional:      General: She is not in acute distress.    Appearance: Normal appearance. She is obese.  HENT:     Head: Normocephalic.  Eyes:     Extraocular Movements: Extraocular movements intact.     Conjunctiva/sclera: Conjunctivae normal.     Pupils: Pupils are equal, round, and reactive to light.  Cardiovascular:     Rate and Rhythm: Normal rate and regular rhythm.     Pulses: Normal pulses.     Heart  sounds: Normal heart sounds. No murmur heard.   Pulmonary:     Effort: Pulmonary effort is normal. No respiratory distress.     Breath sounds: Normal breath sounds. No wheezing.  Skin:    General: Skin is warm and dry.     Capillary Refill: Capillary refill takes less than 2 seconds.     Findings: Rash (She has a macular circular hyperpigmentation with a lighter center to her left chest) present.  Neurological:     General: No focal deficit present.     Mental Status: She is alert and oriented to person, place, and time.     Cranial Nerves: No cranial nerve deficit.     Motor: No weakness.  Psychiatric:        Mood and Affect: Mood normal.        Behavior: Behavior normal.  Thought Content: Thought content normal.        Judgment: Judgment normal.         Assessment And Plan:     1. Type 2 diabetes mellitus with other specified complication, without long-term current use of insulin (HCC)  Chronic, HgbA1c improving at last visit   Continue with current medications  Encouraged to limit intake of sugary foods and drinks  Encouraged to increase physical activity to 150 minutes per week  She is tolerating rybelsus well - CMP14+EGFR - Hemoglobin A1c  2. Essential hypertension  Chronic, fair control  Continue with current medications - CMP14+EGFR  3. Obesity (BMI 30-39.9)  Congratulated her on her 12 lb weight loss  Encouraged to continue with walking and eating a healthy diet  4. Rash and nonspecific skin eruption  She has a macular circular hyperpigmentation with a lighter center to her left chest  Will check for tick borne infection - Lyme Ab (IgG/M) + RMSF( IgG/M)  5. Mixed hyperlipidemia  Chronic, controlled  Continue with current medications, tolerating medications well - Lipid panel     Patient was given opportunity to ask questions. Patient verbalized understanding of the plan and was able to repeat key elements of the plan. All questions  were answered to their satisfaction.  [name], FNP   I, [name], FNP, have reviewed all documentation for this visit. The documentation on 11/20/20 for the exam, diagnosis, procedures, and orders are all accurate and complete.   IF YOU HAVE BEEN REFERRED TO A SPECIALIST, IT MAY TAKE 1-2 WEEKS TO SCHEDULE/PROCESS THE REFERRAL. IF YOU HAVE NOT HEARD FROM US/SPECIALIST IN TWO WEEKS, PLEASE GIVE [place] A CALL AT [phone].   THE PATIENT IS ENCOURAGED TO PRACTICE SOCIAL DISTANCING DUE TO THE COVID-19 PANDEMIC.

## 2020-11-21 LAB — CMP14+EGFR
ALT: 13 IU/L (ref 0–32)
AST: 15 IU/L (ref 0–40)
Albumin/Globulin Ratio: 1.3 (ref 1.2–2.2)
Albumin: 4 g/dL (ref 3.8–4.8)
Alkaline Phosphatase: 49 IU/L (ref 44–121)
BUN/Creatinine Ratio: 10 — ABNORMAL LOW (ref 12–28)
BUN: 8 mg/dL (ref 8–27)
Bilirubin Total: 0.4 mg/dL (ref 0.0–1.2)
CO2: 27 mmol/L (ref 20–29)
Calcium: 9 mg/dL (ref 8.7–10.3)
Chloride: 100 mmol/L (ref 96–106)
Creatinine, Ser: 0.82 mg/dL (ref 0.57–1.00)
Globulin, Total: 3.1 g/dL (ref 1.5–4.5)
Glucose: 86 mg/dL (ref 65–99)
Potassium: 3.7 mmol/L (ref 3.5–5.2)
Sodium: 140 mmol/L (ref 134–144)
Total Protein: 7.1 g/dL (ref 6.0–8.5)
eGFR: 81 mL/min/{1.73_m2} (ref >59)

## 2020-11-21 LAB — HEMOGLOBIN A1C
Est. average glucose Bld gHb Est-mCnc: 126 mg/dL
Hgb A1c MFr Bld: 6 % — ABNORMAL HIGH (ref 4.8–5.6)

## 2020-11-21 LAB — LIPID PANEL
Chol/HDL Ratio: 2.3 ratio (ref 0.0–4.4)
Cholesterol, Total: 149 mg/dL (ref 100–199)
HDL: 64 mg/dL (ref >39)
LDL Chol Calc (NIH): 69 mg/dL (ref 0–99)
Triglycerides: 84 mg/dL (ref 0–149)
VLDL Cholesterol Cal: 16 mg/dL (ref 5–40)

## 2020-11-21 LAB — LYME DISEASE SEROLOGY W/REFLEX: Lyme Total Antibody EIA: NEGATIVE

## 2020-11-22 ENCOUNTER — Other Ambulatory Visit: Payer: Self-pay | Admitting: Nurse Practitioner

## 2020-11-22 DIAGNOSIS — E1169 Type 2 diabetes mellitus with other specified complication: Secondary | ICD-10-CM

## 2020-11-23 ENCOUNTER — Other Ambulatory Visit: Payer: Self-pay

## 2020-11-23 DIAGNOSIS — E1169 Type 2 diabetes mellitus with other specified complication: Secondary | ICD-10-CM

## 2020-11-23 MED ORDER — RYBELSUS 14 MG PO TABS: 1.0000 | ORAL_TABLET | Freq: Every day | ORAL | 0 refills | Status: DC

## 2020-11-29 ENCOUNTER — Telehealth: Payer: Self-pay

## 2020-11-29 NOTE — Telephone Encounter (Signed)
PA for rybelsus has been approved. Pt notified YL,RMA

## 2020-11-29 NOTE — Telephone Encounter (Signed)
PA Request

## 2020-12-11 ENCOUNTER — Other Ambulatory Visit: Payer: Self-pay | Admitting: Nurse Practitioner

## 2020-12-11 DIAGNOSIS — F32A Depression, unspecified: Secondary | ICD-10-CM

## 2020-12-12 ENCOUNTER — Other Ambulatory Visit: Payer: Self-pay | Admitting: Nurse Practitioner

## 2020-12-12 DIAGNOSIS — E1169 Type 2 diabetes mellitus with other specified complication: Secondary | ICD-10-CM

## 2020-12-26 ENCOUNTER — Other Ambulatory Visit: Payer: Self-pay | Admitting: Nurse Practitioner

## 2020-12-26 DIAGNOSIS — E1169 Type 2 diabetes mellitus with other specified complication: Secondary | ICD-10-CM

## 2020-12-28 ENCOUNTER — Other Ambulatory Visit: Payer: Self-pay | Admitting: Nurse Practitioner

## 2020-12-28 DIAGNOSIS — E1169 Type 2 diabetes mellitus with other specified complication: Secondary | ICD-10-CM

## 2021-01-16 ENCOUNTER — Other Ambulatory Visit: Payer: Self-pay | Admitting: Nurse Practitioner

## 2021-01-21 ENCOUNTER — Other Ambulatory Visit: Payer: Self-pay | Admitting: Nurse Practitioner

## 2021-01-21 DIAGNOSIS — G629 Polyneuropathy, unspecified: Secondary | ICD-10-CM

## 2021-01-31 LAB — HM DIABETES EYE EXAM

## 2021-02-01 ENCOUNTER — Encounter: Payer: Self-pay | Admitting: Nurse Practitioner

## 2021-02-08 ENCOUNTER — Ambulatory Visit (INDEPENDENT_AMBULATORY_CARE_PROVIDER_SITE_OTHER): Payer: Managed Care, Other (non HMO) | Admitting: Nurse Practitioner

## 2021-02-08 ENCOUNTER — Other Ambulatory Visit: Payer: Self-pay

## 2021-02-08 ENCOUNTER — Encounter: Payer: Self-pay | Admitting: Nurse Practitioner

## 2021-02-08 VITALS — BP 142/86 | HR 70 | Temp 97.3°F | Ht 64.6 in | Wt 189.3 lb

## 2021-02-08 DIAGNOSIS — Z23 Encounter for immunization: Secondary | ICD-10-CM

## 2021-02-08 DIAGNOSIS — F3342 Major depressive disorder, recurrent, in full remission: Secondary | ICD-10-CM

## 2021-02-08 DIAGNOSIS — E6609 Other obesity due to excess calories: Secondary | ICD-10-CM

## 2021-02-08 DIAGNOSIS — Z0000 Encounter for general adult medical examination without abnormal findings: Secondary | ICD-10-CM | POA: Diagnosis not present

## 2021-02-08 DIAGNOSIS — R82998 Other abnormal findings in urine: Secondary | ICD-10-CM

## 2021-02-08 DIAGNOSIS — I10 Essential (primary) hypertension: Secondary | ICD-10-CM

## 2021-02-08 DIAGNOSIS — E782 Mixed hyperlipidemia: Secondary | ICD-10-CM

## 2021-02-08 DIAGNOSIS — F32A Depression, unspecified: Secondary | ICD-10-CM

## 2021-02-08 DIAGNOSIS — Z6831 Body mass index (BMI) 31.0-31.9, adult: Secondary | ICD-10-CM

## 2021-02-08 DIAGNOSIS — E1169 Type 2 diabetes mellitus with other specified complication: Secondary | ICD-10-CM | POA: Diagnosis not present

## 2021-02-08 LAB — POCT URINALYSIS DIPSTICK
Bilirubin, UA: NEGATIVE
Blood, UA: NEGATIVE
Glucose, UA: NEGATIVE
Ketones, UA: NEGATIVE
Nitrite, UA: NEGATIVE
Protein, UA: NEGATIVE
Spec Grav, UA: 1.015 (ref 1.010–1.025)
Urobilinogen, UA: 0.2 E.U./dL
pH, UA: 7.5 (ref 5.0–8.0)

## 2021-02-08 LAB — POCT UA - MICROALBUMIN
Albumin/Creatinine Ratio, Urine, POC: <30
Creatinine, POC: 100 mg/dL
Microalbumin Ur, POC: 10 mg/L

## 2021-02-08 MED ORDER — ROSUVASTATIN CALCIUM 5 MG PO TABS: ORAL_TABLET | ORAL | 1 refills | Status: DC

## 2021-02-08 MED ORDER — TELMISARTAN 40 MG PO TABS: 40.0000 mg | ORAL_TABLET | Freq: Every day | ORAL | 1 refills | Status: DC

## 2021-02-08 MED ORDER — CITALOPRAM HYDROBROMIDE 40 MG PO TABS: ORAL_TABLET | ORAL | 1 refills | Status: DC

## 2021-02-08 MED ORDER — SHINGRIX 50 MCG/0.5ML IM SUSR
0.5000 mL | Freq: Once | INTRAMUSCULAR | 0 refills | Status: AC
Start: 2021-02-08 — End: 2021-02-08

## 2021-02-08 MED ORDER — RYBELSUS 7 MG PO TABS: 1.0000 | ORAL_TABLET | Freq: Every day | ORAL | 5 refills | Status: DC

## 2021-02-08 NOTE — Patient Instructions (Signed)
Health Maintenance, Female Adopting a healthy lifestyle and getting preventive care are important in promoting health and wellness. Ask your health care provider about: The right schedule for you to have regular tests and exams. Things you can do on your own to prevent diseases and keep yourself healthy. What should I know about diet, weight, and exercise? Eat a healthy diet  Eat a diet that includes plenty of vegetables, fruits, low-fat dairy products, and lean protein. Do not eat a lot of foods that are high in solid fats, added sugars, or sodium.  Maintain a healthy weight Body mass index (BMI) is used to identify weight problems. It estimates body fat based on height and weight. Your health care provider can help determineyour BMI and help you achieve or maintain a healthy weight. Get regular exercise Get regular exercise. This is one of the most important things you can do for your health. Most adults should: Exercise for at least 150 minutes each week. The exercise should increase your heart rate and make you sweat (moderate-intensity exercise). Do strengthening exercises at least twice a week. This is in addition to the moderate-intensity exercise. Spend less time sitting. Even light physical activity can be beneficial. Watch cholesterol and blood lipids Have your blood tested for lipids and cholesterol at 62 years of age, then havethis test every 5 years. Have your cholesterol levels checked more often if: Your lipid or cholesterol levels are high. You are older than 62 years of age. You are at high risk for heart disease. What should I know about cancer screening? Depending on your health history and family history, you may need to have cancer screening at various ages. This may include screening for: Breast cancer. Cervical cancer. Colorectal cancer. Skin cancer. Lung cancer. What should I know about heart disease, diabetes, and high blood pressure? Blood pressure and heart  disease High blood pressure causes heart disease and increases the risk of stroke. This is more likely to develop in people who have high blood pressure readings, are of African descent, or are overweight. Have your blood pressure checked: Every 3-5 years if you are 18-39 years of age. Every year if you are 40 years old or older. Diabetes Have regular diabetes screenings. This checks your fasting blood sugar level. Have the screening done: Once every three years after age 40 if you are at a normal weight and have a low risk for diabetes. More often and at a younger age if you are overweight or have a high risk for diabetes. What should I know about preventing infection? Hepatitis B If you have a higher risk for hepatitis B, you should be screened for this virus. Talk with your health care provider to find out if you are at risk forhepatitis B infection. Hepatitis C Testing is recommended for: Everyone born from 1945 through 1965. Anyone with known risk factors for hepatitis C. Sexually transmitted infections (STIs) Get screened for STIs, including gonorrhea and chlamydia, if: You are sexually active and are younger than 62 years of age. You are older than 62 years of age and your health care provider tells you that you are at risk for this type of infection. Your sexual activity has changed since you were last screened, and you are at increased risk for chlamydia or gonorrhea. Ask your health care provider if you are at risk. Ask your health care provider about whether you are at high risk for HIV. Your health care provider may recommend a prescription medicine to help   prevent HIV infection. If you choose to take medicine to prevent HIV, you should first get tested for HIV. You should then be tested every 3 months for as long as you are taking the medicine. Pregnancy If you are about to stop having your period (premenopausal) and you may become pregnant, seek counseling before you get  pregnant. Take 400 to 800 micrograms (mcg) of folic acid every day if you become pregnant. Ask for birth control (contraception) if you want to prevent pregnancy. Osteoporosis and menopause Osteoporosis is a disease in which the bones lose minerals and strength with aging. This can result in bone fractures. If you are 65 years old or older, or if you are at risk for osteoporosis and fractures, ask your health care provider if you should: Be screened for bone loss. Take a calcium or vitamin D supplement to lower your risk of fractures. Be given hormone replacement therapy (HRT) to treat symptoms of menopause. Follow these instructions at home: Lifestyle Do not use any products that contain nicotine or tobacco, such as cigarettes, e-cigarettes, and chewing tobacco. If you need help quitting, ask your health care provider. Do not use street drugs. Do not share needles. Ask your health care provider for help if you need support or information about quitting drugs. Alcohol use Do not drink alcohol if: Your health care provider tells you not to drink. You are pregnant, may be pregnant, or are planning to become pregnant. If you drink alcohol: Limit how much you use to 0-1 drink a day. Limit intake if you are breastfeeding. Be aware of how much alcohol is in your drink. In the [place] one drink equals one 12 oz bottle of beer (355 mL), one 5 oz glass of wine (148 mL), or one 1 oz glass of hard liquor (44 mL). General instructions Schedule regular health, dental, and eye exams. Stay current with your vaccines. Tell your health care provider if: You often feel depressed. You have ever been abused or do not feel safe at home. Summary Adopting a healthy lifestyle and getting preventive care are important in promoting health and wellness. Follow your health care provider's instructions about healthy diet, exercising, and getting tested or screened for diseases. Follow your health care provider's  instructions on monitoring your cholesterol and blood pressure. This information is not intended to replace advice given to you by your health care provider. Make sure you discuss any questions you have with your healthcare provider. Document Revised: 06/10/2018 Document Reviewed: 06/10/2018 Elsevier Patient Education  2022 [employer]  

## 2021-02-08 NOTE — Progress Notes (Signed)
I,[name],acting as a Education administrator for [employer], FNP.,have documented all relevant documentation on the behalf of [name], FNP,as directed by  [name], FNP while in the presence of [name], [place].  This visit occurred during the SARS-CoV-2 public health emergency.  Safety protocols were in place, including screening questions prior to the visit, additional usage of staff PPE, and extensive cleaning of exam room while observing appropriate contact time as indicated for disinfecting solutions.  Subjective:     Patient ID: Danielle Calderon , female    DOB: 1960-12-13 , 60 y.o.   MRN: [id number]   Chief Complaint  Patient presents with   Annual Exam    HPI  Here for HM. She is followed by Dr Jodi Mourning for her GYN care.     Hypertension This is a chronic problem. The current episode started more than 1 year ago. The problem is unchanged. The problem is controlled. Pertinent negatives include no anxiety, headaches or shortness of breath. There are no associated agents to hypertension. Risk factors for coronary artery disease include obesity and sedentary lifestyle. Past treatments include angiotensin blockers. Compliance problems include exercise and diet.  There is no history of angina.    Past Medical History:  Diagnosis Date   Anxiety    Depression    Diabetes mellitus without complication (HCC)    Type II   GERD (gastroesophageal reflux disease)    Headache    mild daily takes gabapentin   History of migraine headaches    Hyperlipidemia    Hypertension      Family History  Problem Relation Age of Onset   Hypertension Mother    Hypertension Father    Cancer Father    Diabetes Father    Breast cancer Maternal Aunt    AAA (abdominal aortic aneurysm) Maternal Aunt    Cancer Maternal Great-grandmother        cervical vs. uterine (sounds like cervical, treated with cryotherapy)   Breast cancer Cousin        maternal     Current Outpatient Medications:    calcium  carbonate 1250 MG capsule, Take 1,200 mg by mouth at bedtime. , Disp: , Rfl:    cholecalciferol (VITAMIN D) 1000 UNITS tablet, Take 2,000 Units by mouth at bedtime. , Disp: , Rfl:    diphenhydrAMINE (SOMINEX) 25 MG tablet, Take 25 mg by mouth at bedtime as needed for allergies. , Disp: , Rfl:    gabapentin (NEURONTIN) 300 MG capsule, TAKE 1 CAPSULE BY MOUTH TWICE A DAY, Disp: 60 capsule, Rfl: 0   metFORMIN (GLUCOPHAGE) 500 MG tablet, TAKE 1 TABLET BY MOUTH 2 TIMES DAILY WITH A MEAL., Disp: 60 tablet, Rfl: 2   omeprazole (PRILOSEC) 20 MG capsule, Take 20 mg by mouth daily., Disp: , Rfl:    Semaglutide (RYBELSUS) 7 MG TABS, Take 1 tablet by mouth daily. Take 30 minutes before breakfast, Disp: 30 tablet, Rfl: 5   alendronate (FOSAMAX) 70 MG tablet, TAKE 1 TABLET BY MOUTH EVERY 7 DAYS. TAKE WITH A FULL GLASS OF WATER ON AN EMPTY STOMACH, Disp: 4 tablet, Rfl: 2   citalopram (CELEXA) 40 MG tablet, TAKE 1 TABLET BY MOUTH EVERYDAY AT BEDTIME, Disp: 90 tablet, Rfl: 1   rosuvastatin (CRESTOR) 5 MG tablet, TAKE 1 TABLET BY MOUTH EVERYDAY AT BEDTIME, Disp: 90 tablet, Rfl: 1   telmisartan (MICARDIS) 40 MG tablet, Take 1 tablet (40 mg total) by mouth daily., Disp: 90 tablet, Rfl: 1   Allergies  Allergen Reactions  Lisinopril Cough    Other reaction(s): Cough (ALLERGY/intolerance)      The patient states she is post menopausal status.   No LMP recorded. Patient is postmenopausal.. Negative for Dysmenorrhea and Negative for Menorrhagia. Negative for: breast discharge, breast lump(s), breast pain and breast self exam. Associated symptoms include abnormal vaginal bleeding. Pertinent negatives include abnormal bleeding (hematology), anxiety, decreased libido, depression, difficulty falling sleep, dyspareunia, history of infertility, nocturia, sexual dysfunction, sleep disturbances, urinary incontinence, urinary urgency, vaginal discharge and vaginal itching. Diet regular.The patient states her exercise level is  minimal  The patient's tobacco use is:  Social History   Tobacco Use  Smoking Status Former   Years: 17.00   Types: Cigarettes   Quit date: 2004   Years since quitting: 18.7  Smokeless Tobacco Never  She has been exposed to passive smoke. The patient's alcohol use is:  Social History   Substance and Sexual Activity  Alcohol Use Yes   Alcohol/week: 2.0 standard drinks   Types: 2 Glasses of wine per week   Additional information: Last pap 05/31/2019, next one scheduled for 05/30/2022.    Review of Systems  Constitutional: Negative.   HENT: Negative.    Eyes: Negative.   Respiratory: Negative.  Negative for shortness of breath.   Cardiovascular: Negative.   Gastrointestinal: Negative.   Endocrine: Negative.   Genitourinary: Negative.   Musculoskeletal: Negative.   Skin: Negative.   Allergic/Immunologic: Negative.   Neurological: Negative.  Negative for headaches.  Hematological: Negative.   Psychiatric/Behavioral: Negative.      Today's Vitals   02/08/21 1111  BP: (!) 142/86  Pulse: 70  Temp: (!) 97.3 F (36.3 C)  TempSrc: Oral  Weight: 189 lb 4.8 oz (85.9 kg)  Height: 5' 4.6" (1.641 m)   Body mass index is 31.89 kg/m.  Wt Readings from Last 3 Encounters:  02/08/21 189 lb 4.8 oz (85.9 kg)  11/20/20 186 lb 12.8 oz (84.7 kg)  09/27/20 198 lb (89.8 kg)    Objective:  Physical Exam Constitutional:      General: She is not in acute distress.    Appearance: Normal appearance. She is well-developed. She is obese.  HENT:     Head: Normocephalic and atraumatic.     Right Ear: Hearing, tympanic membrane, ear canal and external ear normal. There is no impacted cerumen.     Left Ear: Hearing, tympanic membrane, ear canal and external ear normal. There is no impacted cerumen.     Nose:     Comments: Deferred - masked    Mouth/Throat:     Comments: Deferred - masked  Eyes:     General: Lids are normal.     Extraocular Movements: Extraocular movements intact.      Conjunctiva/sclera: Conjunctivae normal.     Pupils: Pupils are equal, round, and reactive to light.     Funduscopic exam:    Right eye: No papilledema.        Left eye: No papilledema.  Neck:     Thyroid: No thyroid mass.     Vascular: No carotid bruit.  Cardiovascular:     Rate and Rhythm: Normal rate and regular rhythm.     Pulses: Normal pulses.     Heart sounds: Normal heart sounds. No murmur heard. Pulmonary:     Effort: Pulmonary effort is normal. No respiratory distress.     Breath sounds: Normal breath sounds. No wheezing.  Chest:  Breasts:    Tanner Score is 5.  Right: Normal. No swelling, mass or tenderness.     Left: Normal. No swelling, mass or tenderness.  Abdominal:     General: Abdomen is flat. Bowel sounds are normal. There is no distension.     Palpations: Abdomen is soft.     Tenderness: There is no abdominal tenderness.  Genitourinary:    Rectum: Guaiac result negative.  Musculoskeletal:        General: No swelling. Normal range of motion.     Cervical back: Full passive range of motion without pain, normal range of motion and neck supple.     Right lower leg: No edema.     Left lower leg: No edema.  Skin:    General: Skin is warm and dry.     Capillary Refill: Capillary refill takes less than 2 seconds.  Neurological:     General: No focal deficit present.     Mental Status: She is alert and oriented to person, place, and time.     Cranial Nerves: No cranial nerve deficit.     Sensory: No sensory deficit.  Psychiatric:        Mood and Affect: Mood normal.        Behavior: Behavior normal.        Thought Content: Thought content normal.        Judgment: Judgment normal.        Assessment And Plan:     1. Encounter for general adult medical examination w/o abnormal findings - CBC  2. Type 2 diabetes mellitus with other specified complication, without long-term current use of insulin (HCC) Comments: She has not been on Rybelsus for some time,  will restart today. She is aware of the side effects. - Hemoglobin A1c  3. Essential hypertension Comments: Fair control, Encouraged to eat a low salt diet and stay well hydrated with water - POCT Urinalysis Dipstick (81002) - POCT UA - Microalbumin - EKG 12-Lead - CMP14+EGFR  4. Class 1 obesity due to excess calories with serious comorbidity and body mass index (BMI) of 31.0 to 31.9 in adult She is encouraged to strive for BMI less than 30 to decrease cardiac risk. Advised to aim for at least 150 minutes of exercise per week. 5. Recurrent major depressive disorder, in full remission ([place]) Comments: Stable, continue current medications - citalopram (CELEXA) 40 MG tablet; TAKE 1 TABLET BY MOUTH EVERYDAY AT BEDTIME  Dispense: 90 tablet; Refill: 1  6. Mixed hyperlipidemia Comments: Stable, Continue Crestor, tolerating well - CMP14+EGFR - Lipid panel - rosuvastatin (CRESTOR) 5 MG tablet; TAKE 1 TABLET BY MOUTH EVERYDAY AT BEDTIME  Dispense: 90 tablet; Refill: 1  7. Encounter for immunization - Zoster Vaccine Adjuvanted [facility]) injection; Inject 0.5 mLs into the muscle once for 1 dose.  Dispense: 0.5 mL; Refill: 0  8. Urine white blood cells increased Comments: Will send urine for culture, pending results will start antibiotics for urinary tract infection - Urine Culture     Patient was given opportunity to ask questions. Patient verbalized understanding of the plan and was able to repeat key elements of the plan. All questions were answered to their satisfaction.   [name], FNP   I, [name], FNP, have reviewed all documentation for this visit. The documentation on 02/08/21 for the exam, diagnosis, procedures, and orders are all accurate and complete.   THE PATIENT IS ENCOURAGED TO PRACTICE SOCIAL DISTANCING DUE TO THE COVID-19 PANDEMIC.

## 2021-02-09 LAB — CBC
Hematocrit: 37.2 % (ref 34.0–46.6)
Hemoglobin: 11.5 g/dL (ref 11.1–15.9)
MCH: 25.4 pg — ABNORMAL LOW (ref 26.6–33.0)
MCHC: 30.9 g/dL — ABNORMAL LOW (ref 31.5–35.7)
MCV: 82 fL (ref 79–97)
Platelets: 233 10*3/uL (ref 150–450)
RBC: 4.52 x10E6/uL (ref 3.77–5.28)
RDW: 14.6 % (ref 11.7–15.4)
WBC: 4.4 10*3/uL (ref 3.4–10.8)

## 2021-02-09 LAB — LIPID PANEL
Chol/HDL Ratio: 2.4 ratio (ref 0.0–4.4)
Cholesterol, Total: 191 mg/dL (ref 100–199)
HDL: 80 mg/dL (ref >39)
LDL Chol Calc (NIH): 98 mg/dL (ref 0–99)
Triglycerides: 70 mg/dL (ref 0–149)
VLDL Cholesterol Cal: 13 mg/dL (ref 5–40)

## 2021-02-09 LAB — CMP14+EGFR
ALT: 18 IU/L (ref 0–32)
AST: 20 IU/L (ref 0–40)
Albumin/Globulin Ratio: 1.4 (ref 1.2–2.2)
Albumin: 4.2 g/dL (ref 3.8–4.8)
Alkaline Phosphatase: 62 IU/L (ref 44–121)
BUN/Creatinine Ratio: 9 — ABNORMAL LOW (ref 12–28)
BUN: 7 mg/dL — ABNORMAL LOW (ref 8–27)
Bilirubin Total: 0.4 mg/dL (ref 0.0–1.2)
CO2: 25 mmol/L (ref 20–29)
Calcium: 9.4 mg/dL (ref 8.7–10.3)
Chloride: 100 mmol/L (ref 96–106)
Creatinine, Ser: 0.74 mg/dL (ref 0.57–1.00)
Globulin, Total: 3.1 g/dL (ref 1.5–4.5)
Glucose: 85 mg/dL (ref 65–99)
Potassium: 4.1 mmol/L (ref 3.5–5.2)
Sodium: 137 mmol/L (ref 134–144)
Total Protein: 7.3 g/dL (ref 6.0–8.5)
eGFR: 92 mL/min/{1.73_m2} (ref >59)

## 2021-02-09 LAB — HEMOGLOBIN A1C
Est. average glucose Bld gHb Est-mCnc: 120 mg/dL
Hgb A1c MFr Bld: 5.8 % — ABNORMAL HIGH (ref 4.8–5.6)

## 2021-02-10 LAB — URINE CULTURE

## 2021-02-12 ENCOUNTER — Encounter: Payer: Self-pay | Admitting: Nurse Practitioner

## 2021-02-13 ENCOUNTER — Other Ambulatory Visit: Payer: Self-pay | Admitting: Nurse Practitioner

## 2021-02-13 DIAGNOSIS — Z780 Asymptomatic menopausal state: Secondary | ICD-10-CM

## 2021-03-25 MED ORDER — AMOXICILLIN 875 MG PO TABS: 875.0000 mg | ORAL_TABLET | Freq: Two times a day (BID) | ORAL | 0 refills | Status: DC

## 2021-03-28 ENCOUNTER — Other Ambulatory Visit: Payer: Self-pay | Admitting: Nurse Practitioner

## 2021-03-28 DIAGNOSIS — G629 Polyneuropathy, unspecified: Secondary | ICD-10-CM

## 2021-03-28 MED ORDER — GABAPENTIN 300 MG PO CAPS: 300.0000 mg | ORAL_CAPSULE | Freq: Two times a day (BID) | ORAL | 3 refills | Status: DC

## 2021-04-09 ENCOUNTER — Other Ambulatory Visit: Payer: Self-pay | Admitting: Nurse Practitioner

## 2021-05-09 ENCOUNTER — Other Ambulatory Visit: Payer: Self-pay | Admitting: Nurse Practitioner

## 2021-05-11 ENCOUNTER — Other Ambulatory Visit: Payer: Self-pay

## 2021-05-11 DIAGNOSIS — E782 Mixed hyperlipidemia: Secondary | ICD-10-CM

## 2021-05-11 MED ORDER — ROSUVASTATIN CALCIUM 5 MG PO TABS: ORAL_TABLET | ORAL | 1 refills | Status: DC

## 2021-05-14 ENCOUNTER — Other Ambulatory Visit: Payer: Self-pay

## 2021-05-14 DIAGNOSIS — E782 Mixed hyperlipidemia: Secondary | ICD-10-CM

## 2021-05-14 MED ORDER — ROSUVASTATIN CALCIUM 5 MG PO TABS: ORAL_TABLET | ORAL | 1 refills | Status: DC

## 2021-05-16 ENCOUNTER — Other Ambulatory Visit: Payer: Self-pay

## 2021-05-16 ENCOUNTER — Ambulatory Visit (INDEPENDENT_AMBULATORY_CARE_PROVIDER_SITE_OTHER): Payer: Managed Care, Other (non HMO) | Admitting: Nurse Practitioner

## 2021-05-16 VITALS — BP 126/70 | HR 66 | Temp 98.1°F | Ht 64.6 in | Wt 183.2 lb

## 2021-05-16 DIAGNOSIS — E782 Mixed hyperlipidemia: Secondary | ICD-10-CM

## 2021-05-16 DIAGNOSIS — E1169 Type 2 diabetes mellitus with other specified complication: Secondary | ICD-10-CM | POA: Diagnosis not present

## 2021-05-16 DIAGNOSIS — I10 Essential (primary) hypertension: Secondary | ICD-10-CM | POA: Diagnosis not present

## 2021-05-16 DIAGNOSIS — Z6830 Body mass index (BMI) 30.0-30.9, adult: Secondary | ICD-10-CM

## 2021-05-16 DIAGNOSIS — M8580 Other specified disorders of bone density and structure, unspecified site: Secondary | ICD-10-CM

## 2021-05-16 DIAGNOSIS — Z780 Asymptomatic menopausal state: Secondary | ICD-10-CM

## 2021-05-16 DIAGNOSIS — E6609 Other obesity due to excess calories: Secondary | ICD-10-CM | POA: Diagnosis not present

## 2021-05-16 MED ORDER — ALENDRONATE SODIUM 70 MG PO TABS: ORAL_TABLET | ORAL | 2 refills | Status: DC

## 2021-05-16 MED ORDER — RYBELSUS 14 MG PO TABS
1.0000 | ORAL_TABLET | Freq: Every morning | ORAL | 1 refills | Status: DC
Start: 2021-05-16 — End: 2021-10-16

## 2021-05-16 MED ORDER — ROSUVASTATIN CALCIUM 5 MG PO TABS: ORAL_TABLET | ORAL | 1 refills | Status: DC

## 2021-05-16 NOTE — Patient Instructions (Signed)

## 2021-05-16 NOTE — Progress Notes (Signed)
I,Danielle Calderon,acting as a Education administrator for [employer], FNP.,have documented all relevant documentation on the behalf of Danielle Brine, FNP,as directed by  Danielle Brine, FNP while in the presence of Danielle Calderon, [place].  This visit occurred during the SARS-CoV-2 public health emergency.  Safety protocols were in place, including screening questions prior to the visit, additional usage of staff PPE, and extensive cleaning of exam room while observing appropriate contact time as indicated for disinfecting solutions.  Subjective:     Patient ID: Danielle Calderon , female    DOB: February 18, 1961 , 60 y.o.   MRN: [id number]   Chief Complaint  Patient presents with   Hypertension   Diabetes    HPI  Patient here for a f/u on her diabetes and blood pressure. Denies any side effects to include constipation or nausea.   Wt Readings from Last 3 Encounters: 05/16/21 : 183 lb 3.2 oz (83.1 kg) 02/08/21 : 189 lb 4.8 oz (85.9 kg) 11/20/20 : 186 lb 12.8 oz (84.7 kg)    Hypertension This is a chronic problem. The current episode started more than 1 year ago. The problem is unchanged. The problem is uncontrolled (has not taken her medication today). Pertinent negatives include no anxiety, chest pain, headaches or palpitations. There are no associated agents to hypertension. Risk factors for coronary artery disease include obesity and sedentary lifestyle. Past treatments include angiotensin blockers. Compliance problems include exercise and diet.  There is no history of kidney disease. There is no history of chronic renal disease.  Diabetes She presents for her follow-up diabetic visit. She has type 2 diabetes mellitus. Her disease course has been stable. Pertinent negatives for hypoglycemia include no confusion, dizziness, headaches or nervousness/anxiousness. Pertinent negatives for diabetes include no chest pain, no polydipsia, no polyphagia and no polyuria. There are no hypoglycemic complications. There are no  diabetic complications. Risk factors for coronary artery disease include obesity. Current diabetic treatment includes oral agent (monotherapy). She is compliant with treatment all of the time. She is following a generally healthy (she is doing better with her diet) diet. When asked about meal planning, she reported none. She has not had a previous visit with a dietitian. She participates in exercise three times a week. (Not checking due to not wanting to stick her finger) An ACE inhibitor/angiotensin II receptor blocker is being taken. She does not see a podiatrist.Eye exam is current.    Past Medical History:  Diagnosis Date   Anxiety    Depression    Diabetes mellitus without complication (HCC)    Type II   GERD (gastroesophageal reflux disease)    Headache    mild daily takes gabapentin   History of migraine headaches    Hyperlipidemia    Hypertension      Family History  Problem Relation Age of Onset   Hypertension Mother    Hypertension Father    Cancer Father    Diabetes Father    Breast cancer Maternal Aunt    AAA (abdominal aortic aneurysm) Maternal Aunt    Cancer Maternal Great-grandmother        cervical vs. uterine (sounds like cervical, treated with cryotherapy)   Breast cancer Cousin        maternal     Current Outpatient Medications:    alendronate (FOSAMAX) 70 MG tablet, TAKE 1 TABLET BY MOUTH EVERY 7 DAYS. TAKE WITH A FULL GLASS OF WATER ON AN EMPTY STOMACH, Disp: 4 tablet, Rfl: 2   calcium carbonate 1250 MG capsule, Take  1,200 mg by mouth at bedtime. , Disp: , Rfl:    cholecalciferol (VITAMIN D) 1000 UNITS tablet, Take 2,000 Units by mouth at bedtime. , Disp: , Rfl:    citalopram (CELEXA) 40 MG tablet, TAKE 1 TABLET BY MOUTH EVERYDAY AT BEDTIME, Disp: 90 tablet, Rfl: 1   diphenhydrAMINE (SOMINEX) 25 MG tablet, Take 25 mg by mouth at bedtime as needed for allergies. , Disp: , Rfl:    gabapentin (NEURONTIN) 300 MG capsule, Take 1 capsule (300 mg total) by mouth 2  (two) times daily., Disp: 60 capsule, Rfl: 3   metFORMIN (GLUCOPHAGE) 500 MG tablet, TAKE 1 TABLET BY MOUTH TWICE DAILY WITH A MEAL, Disp: 60 tablet, Rfl: 0   omeprazole (PRILOSEC) 20 MG capsule, Take 20 mg by mouth daily., Disp: , Rfl:    rosuvastatin (CRESTOR) 5 MG tablet, TAKE 1 TABLET BY MOUTH EVERYDAY AT BEDTIME, Disp: 90 tablet, Rfl: 1   RYBELSUS 14 MG TABS, Take 1 tablet by mouth every morning., Disp: 90 tablet, Rfl: 1   telmisartan (MICARDIS) 40 MG tablet, Take 1 tablet (40 mg total) by mouth daily., Disp: 90 tablet, Rfl: 1   Allergies  Allergen Reactions   Lisinopril Cough    Other reaction(s): Cough (ALLERGY/intolerance)     Review of Systems  Constitutional: Negative.   Respiratory: Negative.    Cardiovascular: Negative.  Negative for chest pain, palpitations and leg swelling.  Gastrointestinal: Negative.   Endocrine: Negative for polydipsia, polyphagia and polyuria.  Neurological: Negative.  Negative for dizziness and headaches.  Psychiatric/Behavioral:  Negative for confusion. The patient is not nervous/anxious.     Today's Vitals   05/16/21 1116  BP: 126/70  Pulse: 66  Temp: 98.1 F (36.7 C)  TempSrc: Oral  Weight: 183 lb 3.2 oz (83.1 kg)  Height: 5' 4.6" (1.641 m)   Body mass index is 30.86 kg/m.  Wt Readings from Last 3 Encounters:  05/16/21 183 lb 3.2 oz (83.1 kg)  02/08/21 189 lb 4.8 oz (85.9 kg)  11/20/20 186 lb 12.8 oz (84.7 kg)    Objective:  Physical Exam Vitals reviewed.  Constitutional:      General: She is not in acute distress.    Appearance: Normal appearance. She is obese.  HENT:     Head: Normocephalic.  Eyes:     Pupils: Pupils are equal, round, and reactive to light.  Cardiovascular:     Rate and Rhythm: Normal rate and regular rhythm.     Pulses: Normal pulses.     Heart sounds: Normal heart sounds. No murmur heard. Pulmonary:     Effort: Pulmonary effort is normal. No respiratory distress.     Breath sounds: Normal breath  sounds. No wheezing.  Skin:    General: Skin is warm and dry.     Capillary Refill: Capillary refill takes less than 2 seconds.  Neurological:     General: No focal deficit present.     Mental Status: She is alert and oriented to person, place, and time.     Cranial Nerves: No cranial nerve deficit.     Motor: No weakness.  Psychiatric:        Mood and Affect: Mood normal.        Behavior: Behavior normal.        Thought Content: Thought content normal.        Judgment: Judgment normal.        Assessment And Plan:     1. Essential hypertension Comments: Blood pressure is  well controlled. Continue current medications  2. Type 2 diabetes mellitus with other specified complication, without long-term current use of insulin (HCC) Comments: Stable at last visit, continue current medication she is tolerating well - RYBELSUS 14 MG TABS; Take 1 tablet by mouth every morning.  Dispense: 90 tablet; Refill: 1  3. Class 1 obesity due to excess calories with serious comorbidity and body mass index (BMI) of 30.0 to 30.9 in adult  She is encouraged to strive for BMI less than 30 to decrease cardiac risk. Advised to aim for at least 150 minutes of exercise per week.  4. Mixed hyperlipidemia Comments: Stable, Continue Crestor, tolerating well - rosuvastatin (CRESTOR) 5 MG tablet; TAKE 1 TABLET BY MOUTH EVERYDAY AT BEDTIME  Dispense: 90 tablet; Refill: 1  5. Osteopenia after menopause Comments: Vitamin d supplement and calcium supplement regularly, will repeat bone density in 2 years - alendronate (FOSAMAX) 70 MG tablet; TAKE 1 TABLET BY MOUTH EVERY 7 DAYS. TAKE WITH A FULL GLASS OF WATER ON AN EMPTY STOMACH  Dispense: 4 tablet; Refill: 2     Patient was given opportunity to ask questions. Patient verbalized understanding of the plan and was able to repeat key elements of the plan. All questions were answered to their satisfaction.  Danielle Brine, FNP   I, Danielle Brine, FNP, have reviewed all  documentation for this visit. The documentation on 05/16/21 for the exam, diagnosis, procedures, and orders are all accurate and complete.   IF YOU HAVE BEEN REFERRED TO A SPECIALIST, IT MAY TAKE 1-2 WEEKS TO SCHEDULE/PROCESS THE REFERRAL. IF YOU HAVE NOT HEARD FROM US/SPECIALIST IN TWO WEEKS, PLEASE GIVE [place] A CALL AT [phone] X [phone].   THE PATIENT IS ENCOURAGED TO PRACTICE SOCIAL DISTANCING DUE TO THE COVID-19 PANDEMIC.

## 2021-05-20 IMAGING — US US PELVIS COMPLETE WITH TRANSVAGINAL
1 series · 13 of 25 positions shown · non-contrast
Comparison: 06/07/2019

CLINICAL DATA: Postmenopausal bleeding 1 month ago, thickened
endometrium, follow-up, bag Ace for 3 months

EXAM:
TRANSABDOMINAL AND TRANSVAGINAL ULTRASOUND OF PELVIS
TECHNIQUE: Both transabdominal and transvaginal ultrasound examinations of the
pelvis were performed. Transabdominal technique was performed for
global imaging of the pelvis including uterus, ovaries, adnexal
regions, and pelvic cul-de-sac. It was necessary to proceed with
endovaginal exam following the transabdominal exam to visualize the
endometrium and ovaries.

[Series 1: us pelvis complete with transvaginal · 0.28mm/px · 13 of 40 slices shown]
[im 1/40]
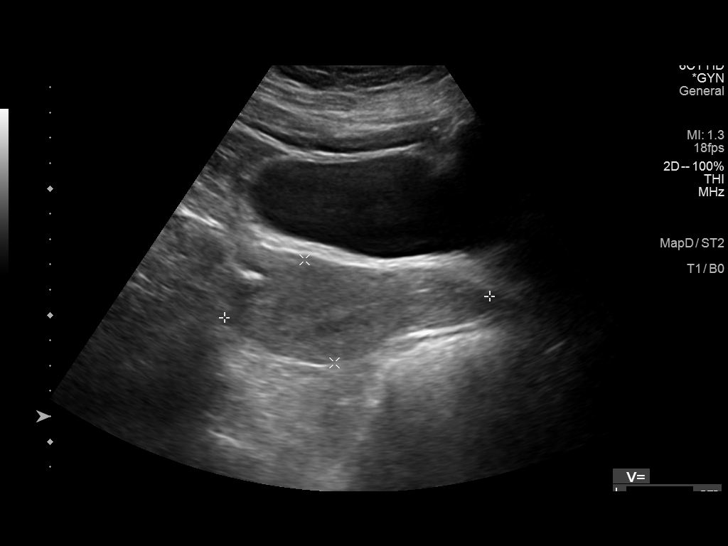
[im 4/40]
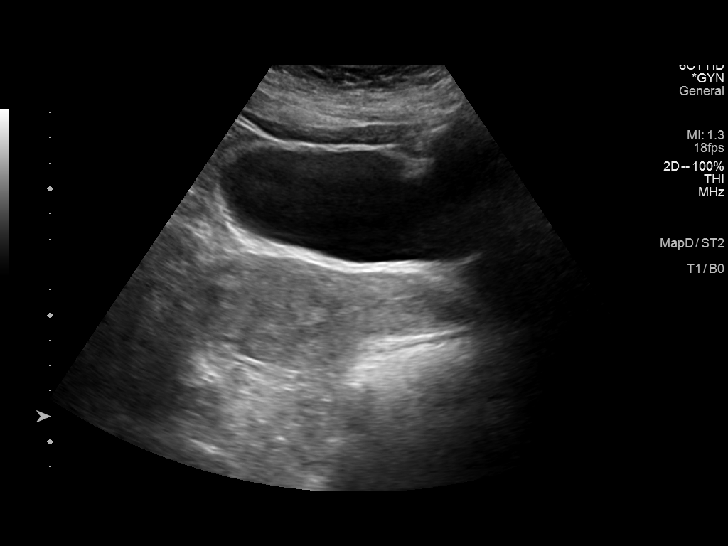
[im 7/40]
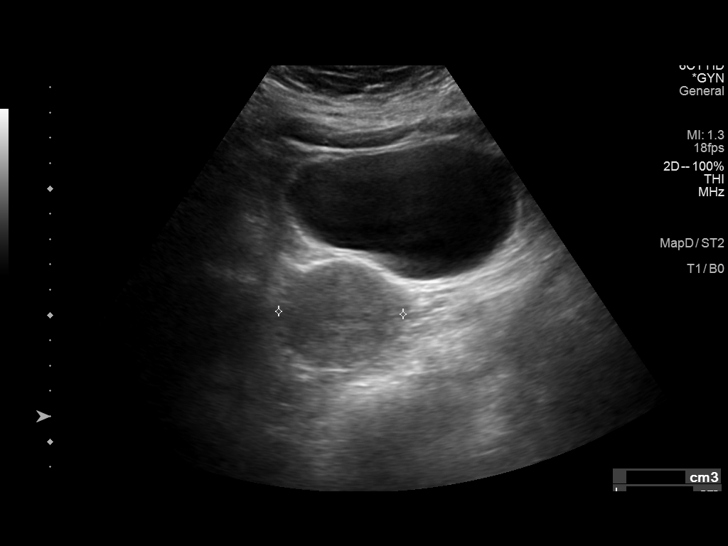
[im 10/40]
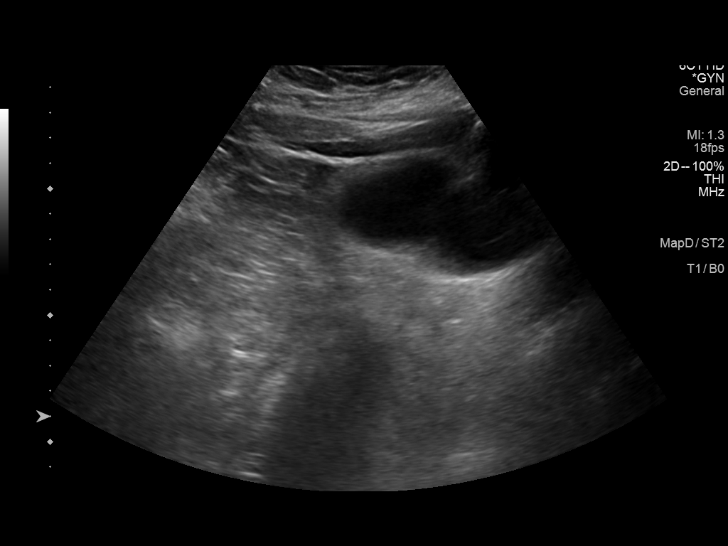
[im 14/40]
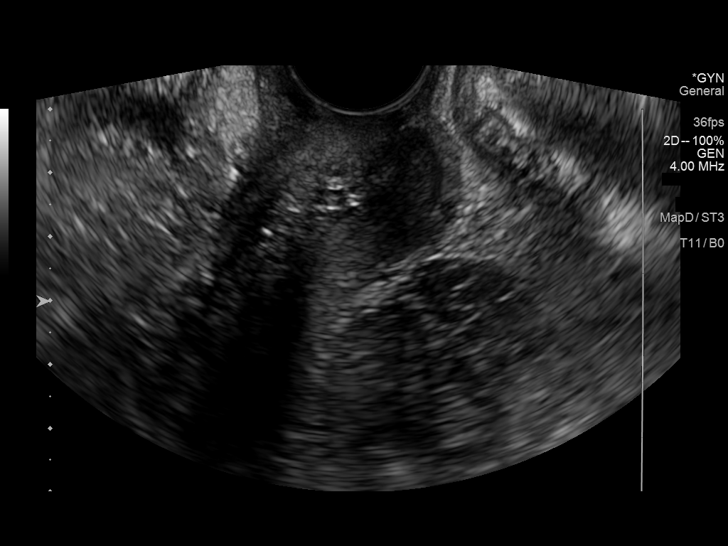
[im 17/40]
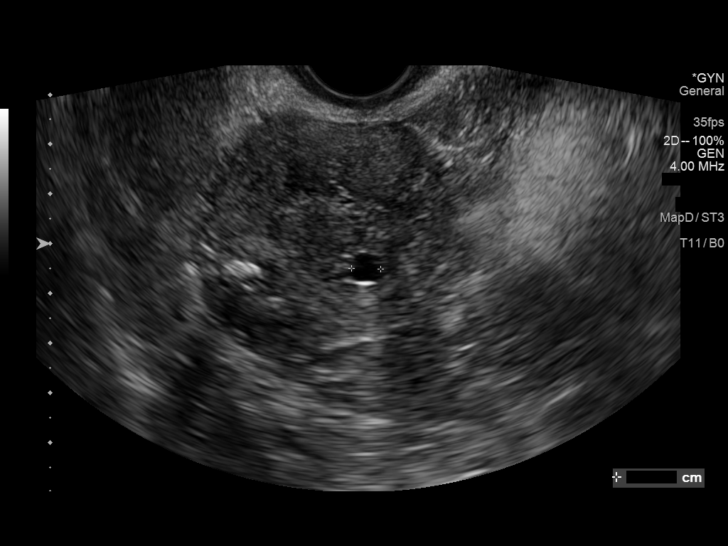
[im 20/40]
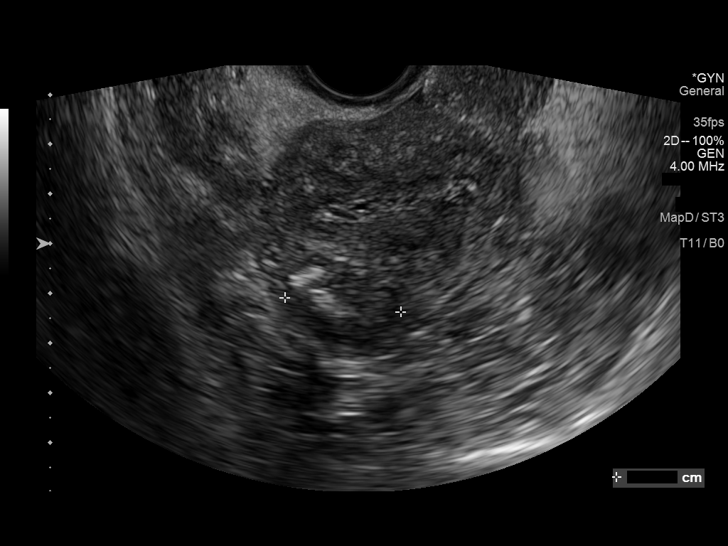
[im 23/40]
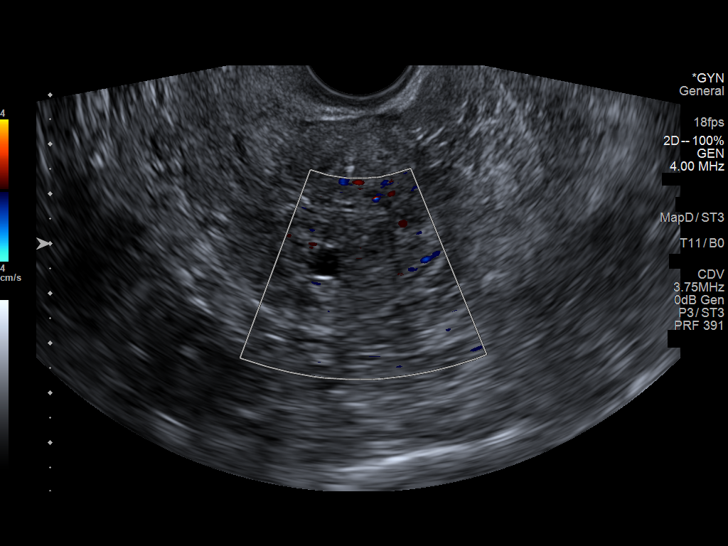
[im 27/40]
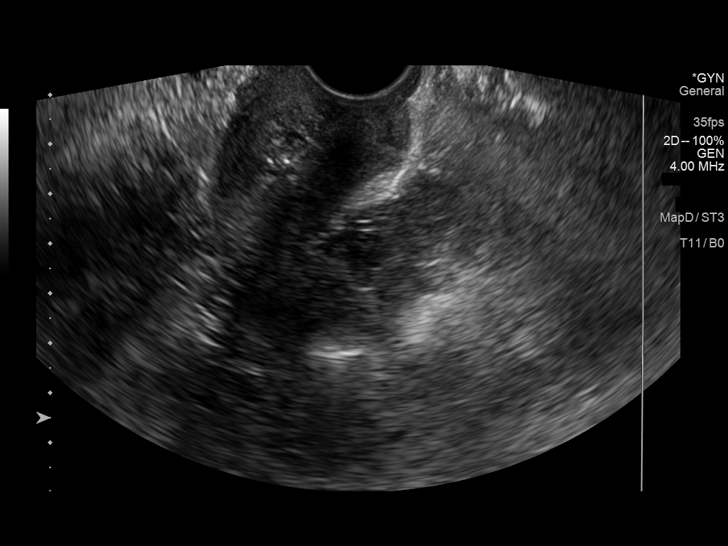
[im 30/40]
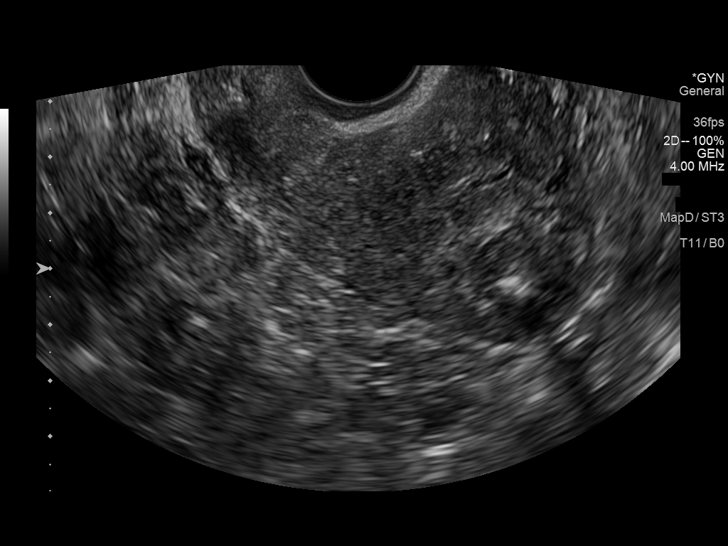
[im 33/40]
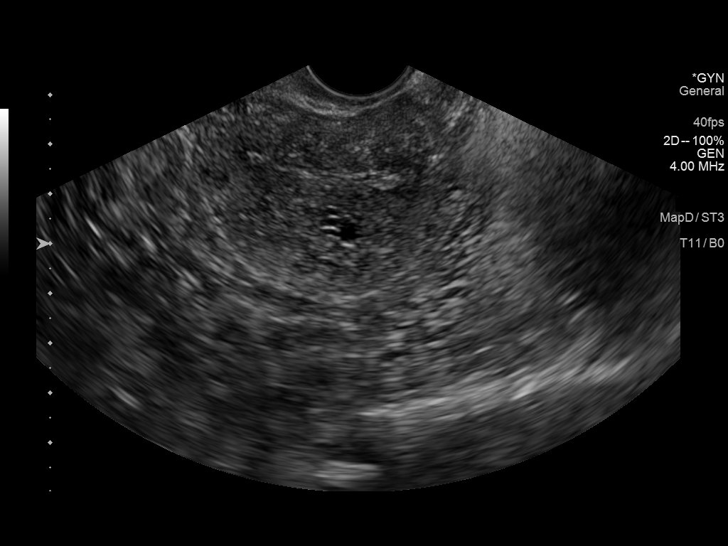
[im 36/40]
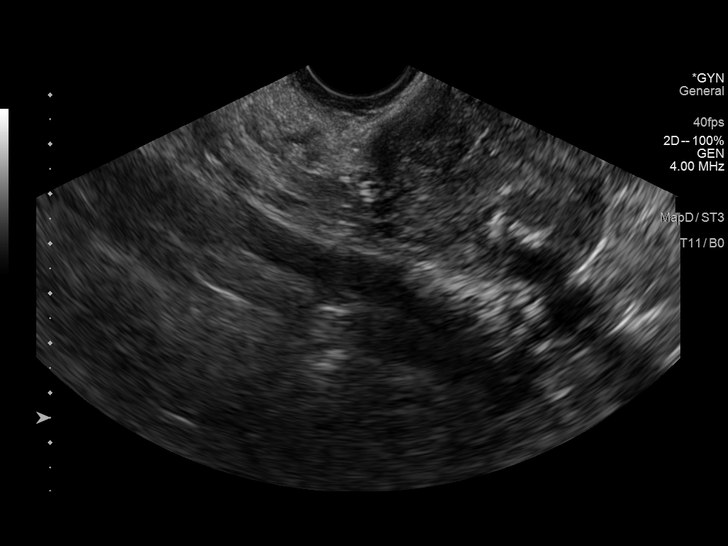
[im 40/40]
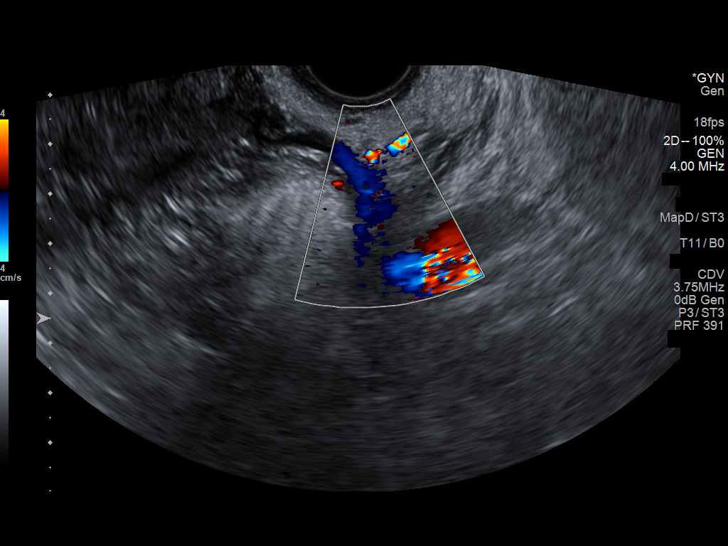

[13 of 25 positions shown; findings below may reference images not displayed]

FINDINGS: Uterus

Measurements: 10.5 x 4.2 x 4.9 cm = volume: 115 mL. Retroflexed.
Heterogeneous myometrium. Subserosal leiomyoma at fundus 1.8 x 1.7 x
2.4 cm. Additional tiny myometrial cyst.

Endometrium

Thickness: 4 mm.  No endometrial fluid or focal abnormality

Right ovary

Not visualized, likely obscured by bowel

Left ovary

Not visualized, likely obscured by bowel

Other findings

No free pelvic fluid.  No adnexal masses.
IMPRESSION: Small subserosal leiomyoma at uterine fundus 2.4 cm diameter.

Otherwise normal exam.

Specifically endometrial complex is normal in thickness at 4 mm
thick without focal abnormality.

## 2021-06-04 ENCOUNTER — Other Ambulatory Visit: Payer: Self-pay | Admitting: Nurse Practitioner

## 2021-06-11 ENCOUNTER — Encounter: Payer: Self-pay | Admitting: Nurse Practitioner

## 2021-07-04 ENCOUNTER — Other Ambulatory Visit: Payer: Self-pay | Admitting: Nurse Practitioner

## 2021-07-25 LAB — HM MAMMOGRAPHY: HM Mammogram: NORMAL (ref 0–4)

## 2021-07-28 ENCOUNTER — Encounter: Payer: Self-pay | Admitting: Nurse Practitioner

## 2021-08-03 ENCOUNTER — Other Ambulatory Visit: Payer: Self-pay | Admitting: Nurse Practitioner

## 2021-08-03 DIAGNOSIS — F3342 Major depressive disorder, recurrent, in full remission: Secondary | ICD-10-CM

## 2021-08-20 ENCOUNTER — Ambulatory Visit (INDEPENDENT_AMBULATORY_CARE_PROVIDER_SITE_OTHER): Payer: Managed Care, Other (non HMO) | Admitting: Nurse Practitioner

## 2021-08-20 ENCOUNTER — Other Ambulatory Visit: Payer: Self-pay

## 2021-08-20 ENCOUNTER — Encounter: Payer: Self-pay | Admitting: Nurse Practitioner

## 2021-08-20 VITALS — BP 110/78 | HR 65 | Temp 98.1°F | Ht 64.6 in | Wt 176.4 lb

## 2021-08-20 DIAGNOSIS — E663 Overweight: Secondary | ICD-10-CM | POA: Diagnosis not present

## 2021-08-20 DIAGNOSIS — Z87440 Personal history of urinary (tract) infections: Secondary | ICD-10-CM

## 2021-08-20 DIAGNOSIS — I10 Essential (primary) hypertension: Secondary | ICD-10-CM | POA: Diagnosis not present

## 2021-08-20 DIAGNOSIS — E1169 Type 2 diabetes mellitus with other specified complication: Secondary | ICD-10-CM

## 2021-08-20 DIAGNOSIS — Z6829 Body mass index (BMI) 29.0-29.9, adult: Secondary | ICD-10-CM

## 2021-08-20 LAB — BMP8+EGFR
BUN/Creatinine Ratio: 14 (ref 12–28)
BUN: 11 mg/dL (ref 8–27)
CO2: 28 mmol/L (ref 20–29)
Calcium: 9.7 mg/dL (ref 8.7–10.3)
Chloride: 99 mmol/L (ref 96–106)
Creatinine, Ser: 0.76 mg/dL (ref 0.57–1.00)
Glucose: 80 mg/dL (ref 70–99)
Potassium: 3.6 mmol/L (ref 3.5–5.2)
Sodium: 140 mmol/L (ref 134–144)
eGFR: 89 mL/min/{1.73_m2} (ref >59)

## 2021-08-20 LAB — HEMOGLOBIN A1C
Est. average glucose Bld gHb Est-mCnc: 108 mg/dL
Hgb A1c MFr Bld: 5.4 % (ref 4.8–5.6)

## 2021-08-20 NOTE — Patient Instructions (Signed)
Cooking With Less Salt Cooking with less salt is one way to reduce the amount of sodium you get from food. Sodium is one of the elements that make up salt. It is found naturally in foods and is also added to certain foods. Depending on your condition and overall health, your health care provider or dietitian may recommend that you reduce your sodium intake. Most people should have less than 2,300 milligrams (mg) of sodium each day. If you have high blood pressure (hypertension), you may need to limit your sodium to 1,500 mg each day. Follow the tipsbelow to help reduce your sodium intake. What are tips for eating less sodium? Reading food labels  Check the food label before buying or using packaged ingredients. Always check the label for the serving size and sodium content. Look for products with no more than 140 mg of sodium in one serving. Check the % Daily Value column to see what percent of the daily recommended amount of sodium is provided in one serving of the product. Foods with 5% or less in this column are considered low in sodium. Foods with 20% or higher are considered high in sodium. Do not choose foods with salt as one of the first three ingredients on the ingredients list. If salt is one of the first three ingredients, it usually means the item is high in sodium.  Shopping Buy sodium-free or low-sodium products. Look for the following words on food labels: Low-sodium. Sodium-free. Reduced-sodium. No salt added. Unsalted. Always check the sodium content even if foods are labeled as low-sodium or no salt added. Buy fresh foods. Cooking Use herbs, seasonings without salt, and spices as substitutes for salt. Use sodium-free baking soda when baking. Grill, braise, or roast foods to add flavor with less salt. Avoid adding salt to pasta, rice, or hot cereals. Drain and rinse canned vegetables, beans, and meat before use. Avoid adding salt when cooking sweets and desserts. Cook with  low-sodium ingredients. What foods are high in sodium? Vegetables Regular canned vegetables (not low-sodium or reduced-sodium). Sauerkraut, pickled vegetables, and relishes. Olives. French fries. Onion rings. Regular canned tomato sauce and paste. Regular tomato and vegetable juice. Frozenvegetables in sauces. Grains Instant hot cereals. Bread stuffing, pancake, and biscuit mixes. Croutons. Seasoned rice or pasta mixes. Noodle soup cups. Boxed or frozen macaroni and cheese. Regular salted crackers. Self-rising flour. Rolls. Bagels. Flourtortillas and wraps. Meats and other proteins Meat or fish that is salted, canned, smoked, cured, spiced, or pickled. This includes bacon, ham, sausages, hot dogs, corned beef, chipped beef, meat loaves, salt pork, jerky, pickled herring, anchovies, regular canned tuna, andsardines. Salted nuts. Dairy Processed cheese and cheese spreads. Cheese curds. Blue cheese. Feta cheese.String cheese. Regular cottage cheese. Buttermilk. Canned milk. The items listed above may not be a complete list of foods high in sodium. Actual amounts of sodium may be different depending on processing. Contact a dietitian for more information. What foods are low in sodium? Fruits Fresh, frozen, or canned fruit with no sauce added. Fruit juice. Vegetables Fresh or frozen vegetables with no sauce added. "No salt added" canned vegetables. "No salt added" tomato sauce and paste. Low-sodium orreduced-sodium tomato and vegetable juice. Grains Noodles, pasta, quinoa, rice. Shredded or puffed wheat or puffed rice. Regular or quick oats (not instant). Low-sodium crackers. Low-sodium bread. Whole-grainbread and whole-grain pasta. Unsalted popcorn. Meats and other proteins Fresh or frozen whole meats, poultry (not injected with sodium), and fish with no sauce added. Unsalted nuts. Dried peas, beans, and   lentils without added salt. Unsalted canned beans. Eggs. Unsalted nut butters. Low-sodium canned  tunaor chicken. Dairy Milk. Soy milk. Yogurt. Low-sodium cheeses, such as Swiss, Monterey Jack, mozzarella, and ricotta. Sherbet or ice cream (keep to  cup per serving).Cream cheese. Fats and oils Unsalted butter or margarine. Other foods Homemade pudding. Sodium-free baking soda and baking powder. Herbs and spices.Low-sodium seasoning mixes. Beverages Coffee and tea. Carbonated beverages. The items listed above may not be a complete list of foods low in sodium. Actual amounts of sodium may be different depending on processing. Contact a dietitian for more information. What are some salt alternatives when cooking? The following are herbs, seasonings, and spices that can be used instead of salt to flavor your food. Herbs should be fresh or dried. Do not choose packaged mixes. Next to the name of the herb, spice, or seasoning aresome examples of foods you can pair it with. Herbs [place] leaves - Soups, meat and vegetable dishes, and spaghetti sauce. Basil - Italian dishes, soups, pasta, and fish dishes. Cilantro - Meat, poultry, and vegetable dishes. Chili powder - Marinades and Mexican dishes. Chives - Salad dressings and potato dishes. Cumin - Mexican dishes, couscous, and meat dishes. Dill - Fish dishes, sauces, and salads. Fennel - Meat and vegetable dishes, breads, and cookies. Garlic (do not use garlic salt) - Italian dishes, meat dishes, salad dressings, and sauces. Marjoram - Soups, potato dishes, and meat dishes. Oregano - Pizza and spaghetti sauce. Parsley - Salads, soups, pasta, and meat dishes. Rosemary - Italian dishes, salad dressings, soups, and red meats. Saffron - Fish dishes, pasta, and some poultry dishes. Sage - Stuffings and sauces. Tarragon - Fish and poultry dishes. Thyme - Stuffing, meat, and fish dishes. Seasonings Lemon juice - Fish dishes, poultry dishes, vegetables, and salads. Vinegar - Salad dressings, vegetables, and fish dishes. Spices Cinnamon - Sweet  dishes, such as cakes, cookies, and puddings. Cloves - Gingerbread, puddings, and marinades for meats. Curry - Vegetable dishes, fish and poultry dishes, and stir-fry dishes. Ginger - Vegetable dishes, fish dishes, and stir-fry dishes. Nutmeg - Pasta, vegetables, poultry, fish dishes, and custard. Summary Cooking with less salt is one way to reduce the amount of sodium that you get from food. Buy sodium-free or low-sodium products. Check the food label before using or buying packaged ingredients. Use herbs, seasonings without salt, and spices as substitutes for salt in foods. This information is not intended to replace advice given to you by your health care provider. Make sure you discuss any questions you have with your healthcare provider. Document Revised: 06/09/2019 Document Reviewed: 06/09/2019 Elsevier Patient Education  2022 Elsevier Inc.  

## 2021-08-20 NOTE — Progress Notes (Signed)
I,Katawbba Wiggins,acting as a Education administrator for [employer], FNP.,have documented all relevant documentation on the behalf of [name], FNP,as directed by  [name], FNP while in the presence of [name], [place].  This visit occurred during the SARS-CoV-2 public health emergency.  Safety protocols were in place, including screening questions prior to the visit, additional usage of staff PPE, and extensive cleaning of exam room while observing appropriate contact time as indicated for disinfecting solutions.  Subjective:     Patient ID: Danielle Calderon , female    DOB: [birth year] , 61 y.o.   MRN: [id number]   Chief Complaint  Patient presents with   Hypertension   Diabetes    HPI  Patient here for a f/u on her diabetes and blood pressure. Continues to tolerate Rybelsus well.    Wt Readings from Last 3 Encounters: 08/20/21 : 176 lb 6.4 oz (80 kg) 05/16/21 : 183 lb 3.2 oz (83.1 kg) 02/08/21 : 189 lb 4.8 oz (85.9 kg)    Hypertension This is a chronic problem. The current episode started more than 1 year ago. The problem is unchanged. The problem is uncontrolled (has not taken her medication today). Pertinent negatives include no anxiety, chest pain, headaches or palpitations. There are no associated agents to hypertension. Risk factors for coronary artery disease include obesity and sedentary lifestyle. Past treatments include angiotensin blockers. Compliance problems include exercise and diet.  There is no history of kidney disease. There is no history of chronic renal disease.  Diabetes She presents for her follow-up diabetic visit. She has type 2 diabetes mellitus. Her disease course has been stable. Pertinent negatives for hypoglycemia include no confusion, dizziness, headaches or nervousness/anxiousness. Pertinent negatives for diabetes include no chest pain, no polydipsia, no polyphagia and no polyuria. There are no hypoglycemic complications. There are no diabetic complications. Risk  factors for coronary artery disease include obesity. Current diabetic treatment includes oral agent (monotherapy). She is compliant with treatment all of the time. She is following a generally healthy (she is doing better with her diet) diet. When asked about meal planning, she reported none. She has not had a previous visit with a dietitian. She participates in exercise three times a week. (Not checking due to not wanting to stick her finger) An ACE inhibitor/angiotensin II receptor blocker is being taken. She does not see a podiatrist.Eye exam is current.    Past Medical History:  Diagnosis Date   Anxiety    Depression    Diabetes mellitus without complication (HCC)    Type II   GERD (gastroesophageal reflux disease)    Headache    mild daily takes gabapentin   History of migraine headaches    Hyperlipidemia    Hypertension      Family History  Problem Relation Age of Onset   Hypertension Mother    Hypertension Father    Cancer Father    Diabetes Father    Breast cancer Maternal Aunt    AAA (abdominal aortic aneurysm) Maternal Aunt    Cancer Maternal Great-grandmother        cervical vs. uterine (sounds like cervical, treated with cryotherapy)   Breast cancer Cousin        maternal     Current Outpatient Medications:    alendronate (FOSAMAX) 70 MG tablet, TAKE 1 TABLET BY MOUTH EVERY 7 DAYS. TAKE WITH A FULL GLASS OF WATER ON AN EMPTY STOMACH, Disp: 4 tablet, Rfl: 2   calcium carbonate 1250 MG capsule, Take 1,200 mg by  mouth at bedtime. , Disp: , Rfl:    cholecalciferol (VITAMIN D) 1000 UNITS tablet, Take 2,000 Units by mouth at bedtime. , Disp: , Rfl:    citalopram (CELEXA) 40 MG tablet, TAKE 1 TABLET BY MOUTH ONCE DAILY AT BEDTIME, Disp: 90 tablet, Rfl: 0   gabapentin (NEURONTIN) 300 MG capsule, Take 1 capsule (300 mg total) by mouth 2 (two) times daily., Disp: 60 capsule, Rfl: 3   metFORMIN (GLUCOPHAGE) 500 MG tablet, TAKE 1 TABLET BY MOUTH TWICE DAILY WITH A MEAL, Disp: 180  tablet, Rfl: 0   omeprazole (PRILOSEC) 20 MG capsule, Take 20 mg by mouth daily., Disp: , Rfl:    rosuvastatin (CRESTOR) 5 MG tablet, TAKE 1 TABLET BY MOUTH EVERYDAY AT BEDTIME, Disp: 90 tablet, Rfl: 1   RYBELSUS 14 MG TABS, Take 1 tablet by mouth every morning., Disp: 90 tablet, Rfl: 1   diphenhydrAMINE (SOMINEX) 25 MG tablet, Take 25 mg by mouth at bedtime as needed for allergies.  (Patient not taking: Reported on 08/20/2021), Disp: , Rfl:    telmisartan (MICARDIS) 40 MG tablet, Take 1 tablet (40 mg total) by mouth daily., Disp: 90 tablet, Rfl: 1   Allergies  Allergen Reactions   Lisinopril Cough    Other reaction(s): Cough (ALLERGY/intolerance)     Review of Systems  Cardiovascular:  Negative for chest pain and palpitations.  Endocrine: Negative for polydipsia, polyphagia and polyuria.  Neurological:  Negative for dizziness and headaches.  Psychiatric/Behavioral:  Negative for confusion. The patient is not nervous/anxious.     Today's Vitals   08/20/21 1053  BP: 110/78  Pulse: 65  Temp: 98.1 F (36.7 C)  Weight: 176 lb 6.4 oz (80 kg)  Height: 5' 4.6" (1.641 m)   Body mass index is 29.72 kg/m.  Wt Readings from Last 3 Encounters:  08/20/21 176 lb 6.4 oz (80 kg)  05/16/21 183 lb 3.2 oz (83.1 kg)  02/08/21 189 lb 4.8 oz (85.9 kg)    BP Readings from Last 3 Encounters:  08/20/21 110/78  05/16/21 126/70  02/08/21 (!) 142/86    Objective:  Physical Exam Vitals reviewed.  Constitutional:      General: She is not in acute distress.    Appearance: Normal appearance.  HENT:     Head: Normocephalic.  Eyes:     Pupils: Pupils are equal, round, and reactive to light.  Cardiovascular:     Rate and Rhythm: Normal rate and regular rhythm.     Pulses: Normal pulses.     Heart sounds: Normal heart sounds. No murmur heard. Pulmonary:     Effort: Pulmonary effort is normal. No respiratory distress.     Breath sounds: Normal breath sounds. No wheezing.  Skin:    General: Skin  is warm and dry.     Capillary Refill: Capillary refill takes less than 2 seconds.  Neurological:     General: No focal deficit present.     Mental Status: She is alert and oriented to person, place, and time.     Cranial Nerves: No cranial nerve deficit.     Motor: No weakness.  Psychiatric:        Mood and Affect: Mood normal.        Behavior: Behavior normal.        Thought Content: Thought content normal.        Judgment: Judgment normal.        Assessment And Plan:     1. Type 2 diabetes mellitus with other  specified complication, without long-term current use of insulin (HCC) Comments: HgbA1c is improving, continue current medications.  - BMP8+EGFR - Hemoglobin A1c  2. Essential hypertension Comments: Blood pressure is well controlled, continue current medications - BMP8+EGFR  3. Overweight with body mass index (BMI) of 29 to 29.9 in adult She is encouraged to strive for BMI less than 25 to decrease cardiac risk. Advised to aim for at least 150 minutes of exercise per week.  4. Hx of urinary tract infection Comments: Will repeat urine culture due to having an infection last visit without symptoms.  - Culture, Urine   Patient was given opportunity to ask questions. Patient verbalized understanding of the plan and was able to repeat key elements of the plan. All questions were answered to their satisfaction.  [name], FNP   I, [name], FNP, have reviewed all documentation for this visit. The documentation on 08/20/21 for the exam, diagnosis, procedures, and orders are all accurate and complete.   IF YOU HAVE BEEN REFERRED TO A SPECIALIST, IT MAY TAKE 1-2 WEEKS TO SCHEDULE/PROCESS THE REFERRAL. IF YOU HAVE NOT HEARD FROM US/SPECIALIST IN TWO WEEKS, PLEASE GIVE [place] A CALL AT [phone].   THE PATIENT IS ENCOURAGED TO PRACTICE SOCIAL DISTANCING DUE TO THE COVID-19 PANDEMIC.

## 2021-08-22 LAB — URINE CULTURE: Organism ID, Bacteria: NO GROWTH

## 2021-08-28 ENCOUNTER — Ambulatory Visit: Payer: Managed Care, Other (non HMO)

## 2021-10-16 ENCOUNTER — Other Ambulatory Visit: Payer: Self-pay

## 2021-10-16 DIAGNOSIS — E1169 Type 2 diabetes mellitus with other specified complication: Secondary | ICD-10-CM

## 2021-10-16 MED ORDER — RYBELSUS 14 MG PO TABS: 1.0000 | ORAL_TABLET | Freq: Every morning | ORAL | 1 refills | Status: DC

## 2021-11-06 ENCOUNTER — Other Ambulatory Visit: Payer: Self-pay | Admitting: Nurse Practitioner

## 2021-11-16 ENCOUNTER — Encounter (HOSPITAL_COMMUNITY): Payer: Self-pay

## 2021-11-16 ENCOUNTER — Ambulatory Visit (HOSPITAL_COMMUNITY)
Admission: EM | Admit: 2021-11-16 | Discharge: 2021-11-16 | Disposition: A | Discharge status: Home / Self Care | Payer: BC Managed Care – PPO | Attending: Family Medicine | Admitting: Family Medicine

## 2021-11-16 DIAGNOSIS — J029 Acute pharyngitis, unspecified: Secondary | ICD-10-CM | POA: Diagnosis not present

## 2021-11-16 DIAGNOSIS — J069 Acute upper respiratory infection, unspecified: Secondary | ICD-10-CM | POA: Diagnosis not present

## 2021-11-16 DIAGNOSIS — R051 Acute cough: Secondary | ICD-10-CM

## 2021-11-16 MED ORDER — BENZONATATE 100 MG PO CAPS: 100.0000 mg | ORAL_CAPSULE | Freq: Three times a day (TID) | ORAL | 0 refills | Status: DC

## 2021-11-16 NOTE — ED Triage Notes (Signed)
C/o coughing and nasal congestion for 2 days.  Patient has been around a friend with pneumonia.

## 2021-11-16 NOTE — Discharge Instructions (Signed)
You were seen today for upper respiratory symptoms.  This is likely viral or allergy in nature.  I have sent out a cough pill.  You may take over the counter zyrtec and sudafed to help with nasal drainage/congestion.  If you have worsening cough or fever then please follow up for further testing/evaluation.

## 2021-11-16 NOTE — ED Provider Notes (Signed)
[place]    CSN: [id number] Arrival date & time: 11/16/21  1937      History   Chief Complaint Chief Complaint  Patient presents with   Cough    Congestion    Nasal Congestion    HPI Danielle Calderon is a 61 y.o. female.   Patient is here for cough and congestion x several days.  Nose is dripping like a faucet.  Taking zyrtec without help.  Home covid test negative.  A friend she was with this weekend dx with pneumonia.  No fevers/chills. + headache and sore throat.   Past Medical History:  Diagnosis Date   Anxiety    Depression    Diabetes mellitus without complication ([place])    Type II   GERD (gastroesophageal reflux disease)    Headache    mild daily takes gabapentin   History of migraine headaches    Hyperlipidemia    Hypertension     Patient Active Problem List   Diagnosis Date Noted   Endometrial hyperplasia 03/21/2020   Obesity (BMI 30-39.9) 03/21/2020    Past Surgical History:  Procedure Laterality Date   COLONOSCOPY W/ POLYPECTOMY     DILATATION & CURETTAGE/HYSTEROSCOPY WITH MYOSURE N/A 03/14/2020   Procedure: DILATATION & CURETTAGE/HYSTEROSCOPY WITH MYOSURE;  Surgeon: Sloan Leiter, MD;  Location: [place];  Service: Gynecology;  Laterality: N/A;   ROBOTIC ASSISTED TOTAL HYSTERECTOMY WITH BILATERAL SALPINGO OOPHERECTOMY N/A 03/27/2020   Procedure: XI ROBOTIC ASSISTED TOTAL HYSTERECTOMY WITH BILATERAL SALPINGO OOPHORECTOMY;  Surgeon: Lafonda Mosses, MD;  Location: [facility];  Service: Gynecology;  Laterality: N/A;    OB History     Gravida  3   Para  1   Term  1   Preterm      AB  2   Living  1      SAB      IAB      Ectopic      Multiple      Live Births  1            Home Medications    Prior to Admission medications   Medication Sig Start Date End Date Taking? Authorizing Provider  alendronate (FOSAMAX) 70 MG tablet TAKE 1 TABLET BY MOUTH EVERY 7 DAYS. TAKE WITH A FULL GLASS OF WATER ON AN  EMPTY STOMACH 05/16/21   [name], FNP  calcium carbonate 1250 MG capsule Take 1,200 mg by mouth at bedtime.     [provider]  cholecalciferol (VITAMIN D) 1000 UNITS tablet Take 2,000 Units by mouth at bedtime.     [provider]  citalopram (CELEXA) 40 MG tablet TAKE 1 TABLET BY MOUTH ONCE DAILY AT BEDTIME 08/06/21   [name], FNP  diphenhydrAMINE (SOMINEX) 25 MG tablet Take 25 mg by mouth at bedtime as needed for allergies.  Patient not taking: Reported on 08/20/2021    [provider]  gabapentin (NEURONTIN) 300 MG capsule Take 1 capsule (300 mg total) by mouth 2 (two) times daily. 03/28/21   [name], FNP  metFORMIN (GLUCOPHAGE) 500 MG tablet TAKE 1 TABLET BY MOUTH TWICE DAILY WITH A MEAL 11/07/21   [name], FNP  omeprazole (PRILOSEC) 20 MG capsule Take 20 mg by mouth daily.    [provider]  rosuvastatin (CRESTOR) 5 MG tablet TAKE 1 TABLET BY MOUTH EVERYDAY AT BEDTIME 05/16/21   [name], FNP  RYBELSUS 14 MG TABS Take 1 tablet (14 mg total) by mouth  every morning. 10/16/21   [name], FNP  telmisartan (MICARDIS) 40 MG tablet Take 1 tablet (40 mg total) by mouth daily. 02/08/21   [name], FNP    Family History Family History  Problem Relation Age of Onset   Hypertension Mother    Hypertension Father    Cancer Father    Diabetes Father    Breast cancer Maternal Aunt    AAA (abdominal aortic aneurysm) Maternal Aunt    Cancer Maternal Great-grandmother        cervical vs. uterine (sounds like cervical, treated with cryotherapy)   Breast cancer Cousin        maternal    Social History Social History   Tobacco Use   Smoking status: Former    Years: 17.00    Types: Cigarettes    Quit date: 2004    Years since quitting: 19.3   Smokeless tobacco: Never  Vaping Use   Vaping Use: Never used  Substance Use Topics   Alcohol use: Yes    Alcohol/week: 2.0 standard drinks    Types: 2 Glasses of wine per week    Drug use: No     Allergies   Lisinopril   Review of Systems Review of Systems  Constitutional: Negative.   HENT:  Positive for congestion and rhinorrhea.   Respiratory:  Positive for cough.   Cardiovascular: Negative.   Gastrointestinal: Negative.   Genitourinary: Negative.     Physical Exam Triage Vital Signs ED Triage Vitals [11/16/21 1023]  Enc Vitals Group     BP (!) 146/86     Pulse Rate 84     Resp 18     Temp 98.6 F (37 C)     Temp Source Oral     SpO2 100 %     Weight      Height      Head Circumference      Peak Flow      Pain Score      Pain Loc      Pain Edu?      Excl. in [place]?    No data found.  Updated Vital Signs BP (!) 146/86 (BP Location: Left Arm)   Pulse 84   Temp 98.6 F (37 C) (Oral)   Resp 18   SpO2 100%   Visual Acuity Right Eye Distance:   Left Eye Distance:   Bilateral Distance:    Right Eye Near:   Left Eye Near:    Bilateral Near:     Physical Exam Constitutional:      Appearance: Normal appearance.  HENT:     Head: Normocephalic and atraumatic.     Nose: Congestion and rhinorrhea present.     Mouth/Throat:     Mouth: Mucous membranes are moist.     Pharynx: No oropharyngeal exudate or posterior oropharyngeal erythema.  Cardiovascular:     Rate and Rhythm: Normal rate and regular rhythm.  Pulmonary:     Effort: Pulmonary effort is normal.     Breath sounds: Normal breath sounds. No wheezing or rhonchi.  Musculoskeletal:     Cervical back: Normal range of motion and neck supple. Tenderness present.  Neurological:     General: No focal deficit present.     Mental Status: She is alert.     UC Treatments / Results  Labs (all labs ordered are listed, but only abnormal results are displayed) Labs Reviewed - No data to display  EKG   Radiology No results found.  Procedures Procedures (including critical care time)  Medications Ordered in UC Medications - No data to display  Initial Impression /  Assessment and Plan / UC Course  I have reviewed the triage vital signs and the nursing notes.  Pertinent labs & imaging results that were available during my care of the patient were reviewed by me and considered in my medical decision making (see chart for details).     Final Clinical Impressions(s) / UC Diagnoses   Final diagnoses:  Upper respiratory tract infection, unspecified type  Sore throat  Acute cough     Discharge Instructions      You were seen today for upper respiratory symptoms.  This is likely viral or allergy in nature.  I have sent out a cough pill.  You may take over the counter zyrtec and sudafed to help with nasal drainage/congestion.  If you have worsening cough or fever then please follow up for further testing/evaluation.     ED Prescriptions     Medication Sig Dispense Auth. Provider   benzonatate (TESSALON) 100 MG capsule Take 1 capsule (100 mg total) by mouth every 8 (eight) hours. 21 capsule Rondel Oh, MD      PDMP not reviewed this encounter.   Rondel Oh, MD 11/16/21 1058

## 2021-11-20 ENCOUNTER — Ambulatory Visit
Admission: RE | Admit: 2021-11-20 | Discharge: 2021-11-20 | Disposition: A | Discharge status: Home / Self Care | Payer: BC Managed Care – PPO | Source: Ambulatory Visit | Attending: Urgent Care | Admitting: Urgent Care

## 2021-11-20 VITALS — BP 114/69 | HR 79 | Temp 98.4°F | Resp 20

## 2021-11-20 DIAGNOSIS — J019 Acute sinusitis, unspecified: Secondary | ICD-10-CM | POA: Diagnosis not present

## 2021-11-20 DIAGNOSIS — R0981 Nasal congestion: Secondary | ICD-10-CM | POA: Diagnosis not present

## 2021-11-20 MED ORDER — AMOXICILLIN-POT CLAVULANATE 875-125 MG PO TABS: 1.0000 | ORAL_TABLET | Freq: Two times a day (BID) | ORAL | 0 refills | Status: DC

## 2021-11-20 NOTE — ED Triage Notes (Signed)
Pt c/o cough and nasal congestion for a week. She states he nasal drainage is green and reports she is having a sore throat.

## 2021-11-20 NOTE — ED Provider Notes (Signed)
Danielle Calderon   MRN: [id number] DOB: [birth year]  Subjective:   Danielle Calderon is a 61 y.o. female presenting for 1 week history of persistent sinus congestion, rhinorrhea, postnasal drainage and throat pain, coughing.  No chest pain, shortness of breath or wheezing.  No history of asthma.  Patient is not a smoker.  Has been using multiple over-the-counter medications without any relief.  Diabetes is well controlled.  No current facility-administered medications for this encounter.  Current Outpatient Medications:    alendronate (FOSAMAX) 70 MG tablet, TAKE 1 TABLET BY MOUTH EVERY 7 DAYS. TAKE WITH A FULL GLASS OF WATER ON AN EMPTY STOMACH, Disp: 4 tablet, Rfl: 2   benzonatate (TESSALON) 100 MG capsule, Take 1 capsule (100 mg total) by mouth every 8 (eight) hours., Disp: 21 capsule, Rfl: 0   calcium carbonate 1250 MG capsule, Take 1,200 mg by mouth at bedtime. , Disp: , Rfl:    cholecalciferol (VITAMIN D) 1000 UNITS tablet, Take 2,000 Units by mouth at bedtime. , Disp: , Rfl:    citalopram (CELEXA) 40 MG tablet, TAKE 1 TABLET BY MOUTH ONCE DAILY AT BEDTIME, Disp: 90 tablet, Rfl: 0   diphenhydrAMINE (SOMINEX) 25 MG tablet, Take 25 mg by mouth at bedtime as needed for allergies.  (Patient not taking: Reported on 08/20/2021), Disp: , Rfl:    gabapentin (NEURONTIN) 300 MG capsule, Take 1 capsule (300 mg total) by mouth 2 (two) times daily., Disp: 60 capsule, Rfl: 3   metFORMIN (GLUCOPHAGE) 500 MG tablet, TAKE 1 TABLET BY MOUTH TWICE DAILY WITH A MEAL, Disp: 180 tablet, Rfl: 0   omeprazole (PRILOSEC) 20 MG capsule, Take 20 mg by mouth daily., Disp: , Rfl:    rosuvastatin (CRESTOR) 5 MG tablet, TAKE 1 TABLET BY MOUTH EVERYDAY AT BEDTIME, Disp: 90 tablet, Rfl: 1   RYBELSUS 14 MG TABS, Take 1 tablet (14 mg total) by mouth every morning., Disp: 90 tablet, Rfl: 1   telmisartan (MICARDIS) 40 MG tablet, Take 1 tablet (40 mg total) by mouth daily., Disp: 90 tablet, Rfl: 1   Allergies   Allergen Reactions   Lisinopril Cough    Other reaction(s): Cough (ALLERGY/intolerance)    Past Medical History:  Diagnosis Date   Anxiety    Depression    Diabetes mellitus without complication (HCC)    Type II   GERD (gastroesophageal reflux disease)    Headache    mild daily takes gabapentin   History of migraine headaches    Hyperlipidemia    Hypertension      Past Surgical History:  Procedure Laterality Date   COLONOSCOPY W/ POLYPECTOMY     DILATATION & CURETTAGE/HYSTEROSCOPY WITH MYOSURE N/A 03/14/2020   Procedure: DILATATION & CURETTAGE/HYSTEROSCOPY WITH MYOSURE;  Surgeon: Sloan Leiter, MD;  Location: [place];  Service: Gynecology;  Laterality: N/A;   ROBOTIC ASSISTED TOTAL HYSTERECTOMY WITH BILATERAL SALPINGO OOPHERECTOMY N/A 03/27/2020   Procedure: XI ROBOTIC ASSISTED TOTAL HYSTERECTOMY WITH BILATERAL SALPINGO OOPHORECTOMY;  Surgeon: Lafonda Mosses, MD;  Location: [facility];  Service: Gynecology;  Laterality: N/A;    Family History  Problem Relation Age of Onset   Hypertension Mother    Hypertension Father    Cancer Father    Diabetes Father    Breast cancer Maternal Aunt    AAA (abdominal aortic aneurysm) Maternal Aunt    Cancer Maternal Great-grandmother        cervical vs. uterine (sounds like cervical, treated with cryotherapy)   Breast cancer  Cousin        maternal    Social History   Tobacco Use   Smoking status: Former    Years: 17.00    Types: Cigarettes    Quit date: 2004    Years since quitting: 19.4   Smokeless tobacco: Never  Vaping Use   Vaping Use: Never used  Substance Use Topics   Alcohol use: Yes    Alcohol/week: 2.0 standard drinks    Types: 2 Glasses of wine per week   Drug use: No    ROS   Objective:   Vitals: BP 114/69 (BP Location: Left Arm)   Pulse 79   Temp 98.4 F (36.9 C) (Oral)   Resp 20   SpO2 95%   Physical Exam Constitutional:      General: She is not in acute distress.     Appearance: Normal appearance. She is well-developed and normal weight. She is not ill-appearing, toxic-appearing or diaphoretic.  HENT:     Head: Normocephalic and atraumatic.     Right Ear: Tympanic membrane, ear canal and external ear normal. No drainage or tenderness. No middle ear effusion. There is no impacted cerumen. Tympanic membrane is not erythematous.     Left Ear: Tympanic membrane, ear canal and external ear normal. No drainage or tenderness.  No middle ear effusion. There is no impacted cerumen. Tympanic membrane is not erythematous.     Nose: Congestion and rhinorrhea present.     Mouth/Throat:     Mouth: Mucous membranes are moist. No oral lesions.     Pharynx: No pharyngeal swelling, oropharyngeal exudate, posterior oropharyngeal erythema or uvula swelling.     Tonsils: No tonsillar exudate or tonsillar abscesses.  Eyes:     General: No scleral icterus.       Right eye: No discharge.        Left eye: No discharge.     Extraocular Movements: Extraocular movements intact.     Right eye: Normal extraocular motion.     Left eye: Normal extraocular motion.     Conjunctiva/sclera: Conjunctivae normal.  Cardiovascular:     Rate and Rhythm: Normal rate and regular rhythm.     Heart sounds: Normal heart sounds. No murmur heard.   No friction rub. No gallop.  Pulmonary:     Effort: Pulmonary effort is normal. No respiratory distress.     Breath sounds: No stridor. No wheezing, rhonchi or rales.  Chest:     Chest wall: No tenderness.  Musculoskeletal:     Cervical back: Normal range of motion and neck supple.  Lymphadenopathy:     Cervical: No cervical adenopathy.  Skin:    General: Skin is warm and dry.  Neurological:     General: No focal deficit present.     Mental Status: She is alert and oriented to person, place, and time.  Psychiatric:        Mood and Affect: Mood normal.        Behavior: Behavior normal.      Assessment and Plan :   PDMP not reviewed this  encounter.  1. Acute non-recurrent sinusitis, unspecified location   2. Sinus congestion    Deferred imaging given clear cardiopulmonary exam, hemodynamically stable vital signs. Will start empiric treatment for sinusitis with Augmentin.  Recommended supportive care otherwise including the use of oral antihistamine, decongestant. Counseled patient on potential for adverse effects with medications prescribed/recommended today, ER and return-to-clinic precautions discussed, patient verbalized understanding.     Bess Harvest,  Freida Busman, [place] 11/20/21 [phone]

## 2021-12-17 ENCOUNTER — Other Ambulatory Visit: Payer: Self-pay | Admitting: Nurse Practitioner

## 2021-12-18 ENCOUNTER — Ambulatory Visit: Payer: Managed Care, Other (non HMO) | Admitting: Nurse Practitioner

## 2021-12-20 ENCOUNTER — Ambulatory Visit: Payer: BC Managed Care – PPO | Admitting: Nurse Practitioner

## 2021-12-20 ENCOUNTER — Encounter: Payer: Self-pay | Admitting: Nurse Practitioner

## 2021-12-20 VITALS — BP 120/78 | HR 64 | Temp 97.8°F | Ht 65.0 in | Wt 180.0 lb

## 2021-12-20 DIAGNOSIS — H539 Unspecified visual disturbance: Secondary | ICD-10-CM

## 2021-12-20 DIAGNOSIS — E1169 Type 2 diabetes mellitus with other specified complication: Secondary | ICD-10-CM

## 2021-12-20 DIAGNOSIS — Z6829 Body mass index (BMI) 29.0-29.9, adult: Secondary | ICD-10-CM

## 2021-12-20 DIAGNOSIS — I10 Essential (primary) hypertension: Secondary | ICD-10-CM

## 2021-12-20 MED ORDER — TRULICITY 0.75 MG/0.5ML ~~LOC~~ SOAJ: 0.7500 mg | SUBCUTANEOUS | 1 refills | Status: DC

## 2021-12-22 ENCOUNTER — Other Ambulatory Visit: Payer: Self-pay | Admitting: Nurse Practitioner

## 2021-12-22 DIAGNOSIS — M8580 Other specified disorders of bone density and structure, unspecified site: Secondary | ICD-10-CM

## 2021-12-25 ENCOUNTER — Telehealth: Payer: Self-pay

## 2021-12-25 NOTE — Telephone Encounter (Signed)
Viewed pts pharmacy benefits to complete PA on CMM

## 2021-12-27 ENCOUNTER — Other Ambulatory Visit: Payer: Self-pay | Admitting: Nurse Practitioner

## 2021-12-27 DIAGNOSIS — F3342 Major depressive disorder, recurrent, in full remission: Secondary | ICD-10-CM

## 2022-01-09 DIAGNOSIS — H2513 Age-related nuclear cataract, bilateral: Secondary | ICD-10-CM | POA: Diagnosis not present

## 2022-01-09 DIAGNOSIS — H5310 Unspecified subjective visual disturbances: Secondary | ICD-10-CM | POA: Diagnosis not present

## 2022-01-09 DIAGNOSIS — E119 Type 2 diabetes mellitus without complications: Secondary | ICD-10-CM | POA: Diagnosis not present

## 2022-01-09 DIAGNOSIS — H5203 Hypermetropia, bilateral: Secondary | ICD-10-CM | POA: Diagnosis not present

## 2022-01-09 LAB — HM DIABETES EYE EXAM

## 2022-01-29 ENCOUNTER — Other Ambulatory Visit: Payer: Self-pay | Admitting: Nurse Practitioner

## 2022-01-29 DIAGNOSIS — M8580 Other specified disorders of bone density and structure, unspecified site: Secondary | ICD-10-CM

## 2022-01-31 ENCOUNTER — Other Ambulatory Visit: Payer: Self-pay | Admitting: Nurse Practitioner

## 2022-01-31 ENCOUNTER — Ambulatory Visit (INDEPENDENT_AMBULATORY_CARE_PROVIDER_SITE_OTHER): Payer: BC Managed Care – PPO | Admitting: Obstetrics & Gynecology

## 2022-01-31 ENCOUNTER — Encounter: Payer: Self-pay | Admitting: Obstetrics & Gynecology

## 2022-01-31 VITALS — BP 133/88 | HR 103 | Ht 64.0 in | Wt 182.0 lb

## 2022-01-31 DIAGNOSIS — Z01419 Encounter for gynecological examination (general) (routine) without abnormal findings: Secondary | ICD-10-CM | POA: Diagnosis not present

## 2022-01-31 DIAGNOSIS — N8500 Endometrial hyperplasia, unspecified: Secondary | ICD-10-CM | POA: Diagnosis not present

## 2022-01-31 DIAGNOSIS — E669 Obesity, unspecified: Secondary | ICD-10-CM

## 2022-01-31 NOTE — Progress Notes (Addendum)
61 y.o GYN presents for AEX.

## 2022-01-31 NOTE — Progress Notes (Signed)
Patient ID: Danielle Calderon, female   DOB: 04-20-1961, 61 y.o.   MRN: [id number]  Chief Complaint  Patient presents with   Gynecologic Exam    HPI Danielle Calderon is a 61 y.o. female.  [id number] S/p RATH for endometrial hyperplasia by Dr. Berline Lopes. No residual hyperplasia was present in the surgical specimen. No h/o abnormal cervical paps. She receives yearly mammograms and is followed by her PCP [name] FNP.  HPI  Past Medical History:  Diagnosis Date   Anxiety    Depression    Diabetes mellitus without complication ([place])    Type II   GERD (gastroesophageal reflux disease)    Headache    mild daily takes gabapentin   History of migraine headaches    Hyperlipidemia    Hypertension     Past Surgical History:  Procedure Laterality Date   COLONOSCOPY W/ POLYPECTOMY     DILATATION & CURETTAGE/HYSTEROSCOPY WITH MYOSURE N/A 03/14/2020   Procedure: DILATATION & CURETTAGE/HYSTEROSCOPY WITH MYOSURE;  Surgeon: Sloan Leiter, MD;  Location: [place];  Service: Gynecology;  Laterality: N/A;   ROBOTIC ASSISTED TOTAL HYSTERECTOMY WITH BILATERAL SALPINGO OOPHERECTOMY N/A 03/27/2020   Procedure: XI ROBOTIC ASSISTED TOTAL HYSTERECTOMY WITH BILATERAL SALPINGO OOPHORECTOMY;  Surgeon: Lafonda Mosses, MD;  Location: [facility];  Service: Gynecology;  Laterality: N/A;    Family History  Problem Relation Age of Onset   Hypertension Mother    Hypertension Father    Cancer Father    Diabetes Father    Breast cancer Maternal Aunt    AAA (abdominal aortic aneurysm) Maternal Aunt    Cancer Maternal Great-grandmother        cervical vs. uterine (sounds like cervical, treated with cryotherapy)   Breast cancer Cousin        maternal    Social History Social History   Tobacco Use   Smoking status: Former    Years: 17.00    Types: Cigarettes    Quit date: 2004    Years since quitting: 19.6   Smokeless tobacco: Never  Vaping Use   Vaping Use: Never used  Substance Use Topics    Alcohol use: Yes    Alcohol/week: 2.0 standard drinks of alcohol    Types: 2 Glasses of wine per week   Drug use: No    Allergies  Allergen Reactions   Lisinopril Cough    Other reaction(s): Cough (ALLERGY/intolerance)    Current Outpatient Medications  Medication Sig Dispense Refill   alendronate (FOSAMAX) 70 MG tablet TAKE 1 TABLET BY MOUTH ONCE A WEEK. TAKE WITH A FULL GLASS OF WATER ON AN EMPTY STOMACH 4 tablet 0   amoxicillin-clavulanate (AUGMENTIN) 875-125 MG tablet Take 1 tablet by mouth 2 (two) times daily. 14 tablet 0   benzonatate (TESSALON) 100 MG capsule Take 1 capsule (100 mg total) by mouth every 8 (eight) hours. 21 capsule 0   calcium carbonate 1250 MG capsule Take 1,200 mg by mouth at bedtime.      cholecalciferol (VITAMIN D) 1000 UNITS tablet Take 2,000 Units by mouth at bedtime.      citalopram (CELEXA) 40 MG tablet TAKE 1 TABLET BY MOUTH ONCE DAILY AT BEDTIME 90 tablet 0   diphenhydrAMINE (SOMINEX) 25 MG tablet Take 25 mg by mouth at bedtime as needed for allergies.  (Patient not taking: Reported on 08/20/2021)     Dulaglutide (TRULICITY) 9.67 EL/3.8BO SOPN Inject 0.75 mg into the skin once a week. 3 mL 1   gabapentin (NEURONTIN) 300 MG  capsule Take 1 capsule (300 mg total) by mouth 2 (two) times daily. 60 capsule 3   metFORMIN (GLUCOPHAGE) 500 MG tablet TAKE 1 TABLET BY MOUTH TWICE DAILY WITH A MEAL 180 tablet 0   omeprazole (PRILOSEC) 20 MG capsule Take 20 mg by mouth daily.     rosuvastatin (CRESTOR) 5 MG tablet TAKE 1 TABLET BY MOUTH EVERYDAY AT BEDTIME 90 tablet 1   telmisartan (MICARDIS) 40 MG tablet Take 1 tablet by mouth once daily 90 tablet 0   No current facility-administered medications for this visit.    Review of Systems Review of Systems  Constitutional: Negative.   HENT: Negative.    Respiratory: Negative.    Cardiovascular: Negative.   Endocrine: Negative.   Genitourinary: Negative.     Blood pressure 133/88, pulse (!) 103, height '5\' 4"'$   (1.626 m), weight 182 lb (82.6 kg).  Physical Exam Physical Exam Vitals and nursing note reviewed. Exam conducted with a chaperone present.  Constitutional:      Appearance: Normal appearance.  HENT:     Head: Normocephalic and atraumatic.  Eyes:     Pupils: Pupils are equal, round, and reactive to light.  Cardiovascular:     Rate and Rhythm: Normal rate.  Pulmonary:     Effort: Pulmonary effort is normal.  Chest:  Breasts:    Right: Normal.     Left: Normal.  Abdominal:     General: Abdomen is flat.     Palpations: Abdomen is soft.  Genitourinary:    Comments: Deferred, not indicated Musculoskeletal:        General: Normal range of motion.     Cervical back: Normal range of motion.  Lymphadenopathy:     Upper Body:     Right upper body: No axillary adenopathy.     Left upper body: No axillary adenopathy.  Skin:    General: Skin is warm and dry.  Neurological:     Mental Status: She is alert.  Psychiatric:        Mood and Affect: Mood normal.        Behavior: Behavior normal.     Data Reviewed Pap and surgical path, notes from Dr. Berline Lopes  Assessment Endometrial hyperplasia  Obesity (BMI 30-39.9)  Well woman exam with routine gynecological exam S/p RATH for benign de  Plan May have routine care with her PCP with annual breast exam, no need for routine pelvic or pap tests    Emeterio Reeve 01/31/2022, 4:26 PM

## 2022-02-11 ENCOUNTER — Other Ambulatory Visit: Payer: Self-pay | Admitting: Nurse Practitioner

## 2022-02-11 DIAGNOSIS — G629 Polyneuropathy, unspecified: Secondary | ICD-10-CM

## 2022-02-16 ENCOUNTER — Other Ambulatory Visit: Payer: Self-pay | Admitting: Nurse Practitioner

## 2022-02-16 DIAGNOSIS — E1169 Type 2 diabetes mellitus with other specified complication: Secondary | ICD-10-CM

## 2022-02-21 ENCOUNTER — Encounter: Payer: Managed Care, Other (non HMO) | Admitting: Nurse Practitioner

## 2022-02-25 ENCOUNTER — Other Ambulatory Visit: Payer: Self-pay | Admitting: Nurse Practitioner

## 2022-02-25 DIAGNOSIS — M8580 Other specified disorders of bone density and structure, unspecified site: Secondary | ICD-10-CM

## 2022-03-05 ENCOUNTER — Encounter: Payer: Self-pay | Admitting: Nurse Practitioner

## 2022-03-05 ENCOUNTER — Ambulatory Visit (INDEPENDENT_AMBULATORY_CARE_PROVIDER_SITE_OTHER): Payer: BC Managed Care – PPO | Admitting: Nurse Practitioner

## 2022-03-05 ENCOUNTER — Other Ambulatory Visit: Payer: Self-pay | Admitting: Nurse Practitioner

## 2022-03-05 VITALS — BP 122/80 | HR 54 | Temp 98.3°F | Ht 64.0 in | Wt 182.2 lb

## 2022-03-05 DIAGNOSIS — Z0000 Encounter for general adult medical examination without abnormal findings: Secondary | ICD-10-CM

## 2022-03-05 DIAGNOSIS — I10 Essential (primary) hypertension: Secondary | ICD-10-CM

## 2022-03-05 DIAGNOSIS — Z780 Asymptomatic menopausal state: Secondary | ICD-10-CM

## 2022-03-05 DIAGNOSIS — H6122 Impacted cerumen, left ear: Secondary | ICD-10-CM | POA: Diagnosis not present

## 2022-03-05 DIAGNOSIS — E782 Mixed hyperlipidemia: Secondary | ICD-10-CM

## 2022-03-05 DIAGNOSIS — E6609 Other obesity due to excess calories: Secondary | ICD-10-CM

## 2022-03-05 DIAGNOSIS — Z87440 Personal history of urinary (tract) infections: Secondary | ICD-10-CM

## 2022-03-05 DIAGNOSIS — E1169 Type 2 diabetes mellitus with other specified complication: Secondary | ICD-10-CM | POA: Diagnosis not present

## 2022-03-05 DIAGNOSIS — Z79899 Other long term (current) drug therapy: Secondary | ICD-10-CM

## 2022-03-05 DIAGNOSIS — Z6831 Body mass index (BMI) 31.0-31.9, adult: Secondary | ICD-10-CM

## 2022-03-05 DIAGNOSIS — G629 Polyneuropathy, unspecified: Secondary | ICD-10-CM

## 2022-03-05 DIAGNOSIS — M8580 Other specified disorders of bone density and structure, unspecified site: Secondary | ICD-10-CM | POA: Diagnosis not present

## 2022-03-05 DIAGNOSIS — H539 Unspecified visual disturbance: Secondary | ICD-10-CM

## 2022-03-05 DIAGNOSIS — G43009 Migraine without aura, not intractable, without status migrainosus: Secondary | ICD-10-CM

## 2022-03-05 LAB — POCT URINALYSIS DIPSTICK
Bilirubin, UA: NEGATIVE
Blood, UA: NEGATIVE
Glucose, UA: NEGATIVE
Ketones, UA: NEGATIVE
Leukocytes, UA: NEGATIVE
Nitrite, UA: NEGATIVE
Protein, UA: NEGATIVE
Spec Grav, UA: 1.025 (ref 1.010–1.025)
Urobilinogen, UA: 0.2 E.U./dL
pH, UA: 6 (ref 5.0–8.0)

## 2022-03-05 MED ORDER — SEMAGLUTIDE (1 MG/DOSE) 4 MG/3ML ~~LOC~~ SOPN: 1.0000 mg | PEN_INJECTOR | SUBCUTANEOUS | 1 refills | Status: DC

## 2022-03-05 NOTE — Progress Notes (Signed)
I,Victoria T Hamilton,acting as a Education administrator for [name], FNP.,have documented all relevant documentation on the behalf of [name], FNP,as directed by  [name], FNP while in the presence of [name], [place].   Subjective:     Patient ID: Danielle Calderon , female    DOB: [birth year] , 61 y.o.   MRN: [id number]   Chief Complaint  Patient presents with   Annual Exam    HPI  Here for HM. She is followed by Dr Jodi Mourning for her GYN care. She has had a total hysterectomy    Hypertension This is a chronic problem. The current episode started more than 1 year ago. The problem is unchanged. The problem is controlled. Pertinent negatives include no anxiety, headaches or shortness of breath. There are no associated agents to hypertension. Risk factors for coronary artery disease include obesity and sedentary lifestyle. Past treatments include angiotensin blockers. Compliance problems include exercise and diet.  There is no history of angina.     Past Medical History:  Diagnosis Date   Anxiety    Depression    Diabetes mellitus without complication (HCC)    Type II   GERD (gastroesophageal reflux disease)    Headache    mild daily takes gabapentin   History of migraine headaches    Hyperlipidemia    Hypertension      Family History  Problem Relation Age of Onset   Hypertension Mother    Hypertension Father    Cancer Father    Diabetes Father    Breast cancer Maternal Aunt    AAA (abdominal aortic aneurysm) Maternal Aunt    Cancer Maternal Great-grandmother        cervical vs. uterine (sounds like cervical, treated with cryotherapy)   Breast cancer Cousin        maternal     Current Outpatient Medications:    alendronate (FOSAMAX) 70 MG tablet, TAKE 1 TABLET BY MOUTH ONCE A WEEK. TAKE WITH A FULL GLASS OF WATER ON AN EMPTY STOMACH, Disp: 4 tablet, Rfl: 0   calcium carbonate 1250 MG capsule, Take 1,200 mg by mouth at bedtime. , Disp: , Rfl:    cholecalciferol (VITAMIN D)  1000 UNITS tablet, Take 2,000 Units by mouth at bedtime. , Disp: , Rfl:    citalopram (CELEXA) 40 MG tablet, TAKE 1 TABLET BY MOUTH ONCE DAILY AT BEDTIME, Disp: 90 tablet, Rfl: 0   metFORMIN (GLUCOPHAGE) 500 MG tablet, TAKE 1 TABLET BY MOUTH TWICE DAILY WITH A MEAL, Disp: 180 tablet, Rfl: 0   omeprazole (PRILOSEC) 20 MG capsule, Take 20 mg by mouth daily., Disp: , Rfl:    rosuvastatin (CRESTOR) 5 MG tablet, TAKE 1 TABLET BY MOUTH EVERYDAY AT BEDTIME, Disp: 90 tablet, Rfl: 1   Semaglutide, 1 MG/DOSE, 4 MG/3ML SOPN, Inject 1 mg into the skin once a week., Disp: 9 mL, Rfl: 1   telmisartan (MICARDIS) 40 MG tablet, Take 1 tablet by mouth once daily, Disp: 90 tablet, Rfl: 0   amoxicillin-clavulanate (AUGMENTIN) 875-125 MG tablet, Take 1 tablet by mouth 2 (two) times daily. (Patient not taking: Reported on 03/05/2022), Disp: 14 tablet, Rfl: 0   benzonatate (TESSALON) 100 MG capsule, Take 1 capsule (100 mg total) by mouth every 8 (eight) hours. (Patient not taking: Reported on 03/05/2022), Disp: 21 capsule, Rfl: 0   diphenhydrAMINE (SOMINEX) 25 MG tablet, Take 25 mg by mouth at bedtime as needed for allergies.  (Patient not taking: Reported on 08/20/2021), Disp: , Rfl:  gabapentin (NEURONTIN) 300 MG capsule, Take 1 capsule by mouth twice daily, Disp: 60 capsule, Rfl: 0   Allergies  Allergen Reactions   Lisinopril Cough    Other reaction(s): Cough (ALLERGY/intolerance)      The patient states she uses status post hysterectomy.  Negative for: breast discharge, breast lump(s), breast pain and breast self exam. Associated symptoms include abnormal vaginal bleeding. Pertinent negatives include abnormal bleeding (hematology), anxiety, decreased libido, depression, difficulty falling sleep, dyspareunia, history of infertility, nocturia, sexual dysfunction, sleep disturbances, urinary incontinence, urinary urgency, vaginal discharge and vaginal itching. Diet regular; she has been juicing and eating vegetables.  The  patient states her exercise level is minimal with once a week with walking.   The patient's tobacco use is:  Social History   Tobacco Use  Smoking Status Former   Years: 17.00   Types: Cigarettes   Quit date: 2004   Years since quitting: 19.6  Smokeless Tobacco Never   She has been exposed to passive smoke. The patient's alcohol use is:  Social History   Substance and Sexual Activity  Alcohol Use Yes   Alcohol/week: 2.0 standard drinks of alcohol   Types: 2 Glasses of wine per week    Review of Systems  Constitutional: Negative.   HENT: Negative.    Eyes: Negative.   Respiratory: Negative.  Negative for shortness of breath.   Cardiovascular: Negative.   Gastrointestinal: Negative.   Endocrine: Negative.   Genitourinary: Negative.   Musculoskeletal: Negative.   Skin: Negative.   Allergic/Immunologic: Negative.   Neurological: Negative.  Negative for headaches.  Hematological: Negative.   Psychiatric/Behavioral: Negative.       Today's Vitals   03/05/22 1038  BP: 122/80  Pulse: (!) 54  Temp: 98.3 F (36.8 C)  SpO2: 98%  Weight: 182 lb 3.2 oz (82.6 kg)  Height: 5' 4"  (1.626 m)  PainSc: 0-No pain   Body mass index is 31.27 kg/m.  Wt Readings from Last 3 Encounters:  03/05/22 182 lb 3.2 oz (82.6 kg)  01/31/22 182 lb (82.6 kg)  12/20/21 180 lb (81.6 kg)    Objective:  Physical Exam Vitals reviewed.  Constitutional:      General: She is not in acute distress.    Appearance: Normal appearance. She is well-developed. She is obese.  HENT:     Head: Normocephalic and atraumatic.     Right Ear: Hearing, tympanic membrane, ear canal and external ear normal. There is no impacted cerumen.     Left Ear: Hearing and external ear normal. There is impacted cerumen.     Nose:     Comments: Deferred - masked    Mouth/Throat:     Comments: Deferred - masked Eyes:     General: Lids are normal.     Extraocular Movements: Extraocular movements intact.      Conjunctiva/sclera: Conjunctivae normal.     Pupils: Pupils are equal, round, and reactive to light.     Funduscopic exam:    Right eye: No papilledema.        Left eye: No papilledema.  Neck:     Thyroid: No thyroid mass.     Vascular: No carotid bruit.  Cardiovascular:     Rate and Rhythm: Normal rate and regular rhythm.     Pulses: Normal pulses.     Heart sounds: Normal heart sounds. No murmur heard. Pulmonary:     Effort: Pulmonary effort is normal. No respiratory distress.     Breath sounds: Normal breath sounds.  No wheezing.  Chest:     Chest wall: No mass.  Breasts:    Tanner Score is 5.     Right: Normal. No mass or tenderness.     Left: Normal. No mass or tenderness.  Abdominal:     General: Abdomen is flat. Bowel sounds are normal. There is no distension.     Palpations: Abdomen is soft.     Tenderness: There is no abdominal tenderness.  Musculoskeletal:        General: No swelling. Normal range of motion.     Cervical back: Full passive range of motion without pain, normal range of motion and neck supple.     Right lower leg: No edema.     Left lower leg: No edema.  Lymphadenopathy:     Upper Body:     Right upper body: No supraclavicular, axillary or pectoral adenopathy.     Left upper body: No supraclavicular, axillary or pectoral adenopathy.  Skin:    General: Skin is warm and dry.     Capillary Refill: Capillary refill takes less than 2 seconds.  Neurological:     General: No focal deficit present.     Mental Status: She is alert and oriented to person, place, and time.     Cranial Nerves: No cranial nerve deficit.     Sensory: No sensory deficit.     Motor: No weakness.  Psychiatric:        Mood and Affect: Mood normal.        Behavior: Behavior normal.        Thought Content: Thought content normal.        Judgment: Judgment normal.         Assessment And Plan:     1. Encounter for general adult medical examination w/o abnormal  findings Behavior modifications discussed and diet history reviewed.   Pt will continue to exercise regularly and modify diet with low GI, plant based foods and decrease intake of processed foods.  Recommend intake of daily multivitamin, Vitamin D, and calcium.  Recommend mammogram and colonoscopy for preventive screenings, as well as recommend immunizations that include influenza, TDAP, and Shingles (up to date)  2. Class 1 obesity due to excess calories without serious comorbidity with body mass index (BMI) of 31.0 to 31.9 in adult She is encouraged to strive for BMI less than 30 to decrease cardiac risk. Advised to aim for at least 150 minutes of exercise per week.   3. Diabetes mellitus type 2 in obese ([place]) Comments: HgbA1c slightly elevated at last visit, continue Ozempic, Diabetes foot exam done, no abnormal findings - POCT Urinalysis Dipstick (81002) - EKG 12-Lead - Hemoglobin A1c - Renal function panel with eGFR - Semaglutide, 1 MG/DOSE, 4 MG/3ML SOPN; Inject 1 mg into the skin once a week.  Dispense: 9 mL; Refill: 1 - Microalbumin / Creatinine Urine Ratio  4. Essential hypertension Comments: Blood pressure is well controlled, continue current medications. EKG done with Bradycardia HR 56 - POCT Urinalysis Dipstick (81002) - EKG 12-Lead - Lipid panel  5. Mixed hyperlipidemia Comments: Cholesterol levels are stable, continue statin, tolerating well.   6. Osteopenia after menopause Comments: Encouraged to take vitamin d and calcium daily.   7. Other long term (current) drug therapy - CMP14+EGFR - CBC  8. Migraine without aura and without status migrainosus, not intractable Comments: Had been seeing Dr .Erling Cruz for neuro migraine, will refer back to Neurology for evaluation - Ambulatory referral to Neurology  9.  Visual disturbance Comments: Seen by Opthalmology and did not find any abnormal findings other than feeling has some damage related to migraines. - Ambulatory  referral to Neurology  10. Impacted cerumen of left ear Comments: Ear lavage with minimal results, she is to use debrox drops - Ear Lavage  11. Hx of urinary tract infection     Patient was given opportunity to ask questions. Patient verbalized understanding of the plan and was able to repeat key elements of the plan. All questions were answered to their satisfaction.   [name], FNP   I, [name], FNP, have reviewed all documentation for this visit. The documentation on 03/08/22 for the exam, diagnosis, procedures, and orders are all accurate and complete.   THE PATIENT IS ENCOURAGED TO PRACTICE SOCIAL DISTANCING DUE TO THE COVID-19 PANDEMIC.

## 2022-03-05 NOTE — Patient Instructions (Addendum)
Health Maintenance, Female Adopting a healthy lifestyle and getting preventive care are important in promoting health and wellness. Ask your health care provider about: The right schedule for you to have regular tests and exams. Things you can do on your own to prevent diseases and keep yourself healthy. What should I know about diet, weight, and exercise? Eat a healthy diet  Eat a diet that includes plenty of vegetables, fruits, low-fat dairy products, and lean protein. Do not eat a lot of foods that are high in solid fats, added sugars, or sodium. Maintain a healthy weight Body mass index (BMI) is used to identify weight problems. It estimates body fat based on height and weight. Your health care provider can help determine your BMI and help you achieve or maintain a healthy weight. Get regular exercise Get regular exercise. This is one of the most important things you can do for your health. Most adults should: Exercise for at least 150 minutes each week. The exercise should increase your heart rate and make you sweat (moderate-intensity exercise). Do strengthening exercises at least twice a week. This is in addition to the moderate-intensity exercise. Spend less time sitting. Even light physical activity can be beneficial. Watch cholesterol and blood lipids Have your blood tested for lipids and cholesterol at 63 years of age, then have this test every 5 years. Have your cholesterol levels checked more often if: Your lipid or cholesterol levels are high. You are older than 63 years of age. You are at high risk for heart disease. What should I know about cancer screening? Depending on your health history and family history, you may need to have cancer screening at various ages. This may include screening for: Breast cancer. Cervical cancer. Colorectal cancer. Skin cancer. Lung cancer. What should I know about heart disease, diabetes, and high blood pressure? Blood pressure and heart  disease High blood pressure causes heart disease and increases the risk of stroke. This is more likely to develop in people who have high blood pressure readings or are overweight. Have your blood pressure checked: Every 3-5 years if you are 18-39 years of age. Every year if you are 40 years old or older. Diabetes Have regular diabetes screenings. This checks your fasting blood sugar level. Have the screening done: Once every three years after age 40 if you are at a normal weight and have a low risk for diabetes. More often and at a younger age if you are overweight or have a high risk for diabetes. What should I know about preventing infection? Hepatitis B If you have a higher risk for hepatitis B, you should be screened for this virus. Talk with your health care provider to find out if you are at risk for hepatitis B infection. Hepatitis C Testing is recommended for: Everyone born from 1945 through 1965. Anyone with known risk factors for hepatitis C. Sexually transmitted infections (STIs) Get screened for STIs, including gonorrhea and chlamydia, if: You are sexually active and are younger than 63 years of age. You are older than 63 years of age and your health care provider tells you that you are at risk for this type of infection. Your sexual activity has changed since you were last screened, and you are at increased risk for chlamydia or gonorrhea. Ask your health care provider if you are at risk. Ask your health care provider about whether you are at high risk for HIV. Your health care provider may recommend a prescription medicine to help prevent HIV   infection. If you choose to take medicine to prevent HIV, you should first get tested for HIV. You should then be tested every 3 months for as long as you are taking the medicine. Pregnancy If you are about to stop having your period (premenopausal) and you may become pregnant, seek counseling before you get pregnant. Take 400 to 800  micrograms (mcg) of folic acid every day if you become pregnant. Ask for birth control (contraception) if you want to prevent pregnancy. Osteoporosis and menopause Osteoporosis is a disease in which the bones lose minerals and strength with aging. This can result in bone fractures. If you are 39 years old or older, or if you are at risk for osteoporosis and fractures, ask your health care provider if you should: Be screened for bone loss. Take a calcium or vitamin D supplement to lower your risk of fractures. Be given hormone replacement therapy (HRT) to treat symptoms of menopause. Follow these instructions at home: Alcohol use Do not drink alcohol if: Your health care provider tells you not to drink. You are pregnant, may be pregnant, or are planning to become pregnant. If you drink alcohol: Limit how much you have to: 0-1 drink a day. Know how much alcohol is in your drink. In the [place] one drink equals one 12 oz bottle of beer (355 mL), one 5 oz glass of wine (148 mL), or one 1 oz glass of hard liquor (44 mL). Lifestyle Do not use any products that contain nicotine or tobacco. These products include cigarettes, chewing tobacco, and vaping devices, such as e-cigarettes. If you need help quitting, ask your health care provider. Do not use street drugs. Do not share needles. Ask your health care provider for help if you need support or information about quitting drugs. General instructions Schedule regular health, dental, and eye exams. Stay current with your vaccines. Tell your health care provider if: You often feel depressed. You have ever been abused or do not feel safe at home. Summary Adopting a healthy lifestyle and getting preventive care are important in promoting health and wellness. Follow your health care provider's instructions about healthy diet, exercising, and getting tested or screened for diseases. Follow your health care provider's instructions on monitoring your  cholesterol and blood pressure. This information is not intended to replace advice given to you by your health care provider. Make sure you discuss any questions you have with your health care provider. Document Revised: 11/06/2020 Document Reviewed: 11/06/2020 Elsevier Patient Education  [place].   Staying healthy and adopting a healthy lifestyle for your overall health is important. You should eat 7 or more servings of fruits and vegetables per day. You should drink plenty of water to keep yourself hydrated and your kidneys healthy. This includes about 65-80+ fluid ounces of water. Limit your intake of animal fats especially for elevated cholesterol. Avoid highly processed food and limit your salt intake if you have hypertension. Avoid foods high in saturated/Trans fats. Along with a healthy diet it is also very important to maintain time for yourself to maintain a healthy mental health with low stress levels. You should get atleast 150 min of moderate intensity exercise weekly for a healthy heart. Along with eating right and exercising, aim for at least 7-9 hours of sleep daily.  Eat more whole grains which includes barley, wheat berries, oats, brown rice and whole wheat pasta. Use healthy plant oils which include olive, soy, corn, sunflower and peanut. Limit your caffeine and sugary drinks.  Limit your intake of fast foods. Limit milk and dairy products to one or two daily servings.

## 2022-03-06 LAB — RENAL FUNCTION PANEL: Phosphorus: 3.8 mg/dL (ref 3.0–4.3)

## 2022-03-06 LAB — CMP14+EGFR
ALT: 38 IU/L — ABNORMAL HIGH (ref 0–32)
AST: 37 IU/L (ref 0–40)
Albumin/Globulin Ratio: 1.3 (ref 1.2–2.2)
Albumin: 4 g/dL (ref 3.9–4.9)
Alkaline Phosphatase: 55 IU/L (ref 44–121)
BUN/Creatinine Ratio: 16 (ref 12–28)
BUN: 13 mg/dL (ref 8–27)
Bilirubin Total: 0.3 mg/dL (ref 0.0–1.2)
CO2: 25 mmol/L (ref 20–29)
Calcium: 8.8 mg/dL (ref 8.7–10.3)
Chloride: 105 mmol/L (ref 96–106)
Creatinine, Ser: 0.81 mg/dL (ref 0.57–1.00)
Globulin, Total: 3.2 g/dL (ref 1.5–4.5)
Glucose: 89 mg/dL (ref 70–99)
Potassium: 4.1 mmol/L (ref 3.5–5.2)
Sodium: 143 mmol/L (ref 134–144)
Total Protein: 7.2 g/dL (ref 6.0–8.5)
eGFR: 82 mL/min/{1.73_m2} (ref >59)

## 2022-03-06 LAB — HEMOGLOBIN A1C
Est. average glucose Bld gHb Est-mCnc: 126 mg/dL
Hgb A1c MFr Bld: 6 % — ABNORMAL HIGH (ref 4.8–5.6)

## 2022-03-06 LAB — CBC
Hematocrit: 38 % (ref 34.0–46.6)
Hemoglobin: 11.1 g/dL (ref 11.1–15.9)
MCH: 24 pg — ABNORMAL LOW (ref 26.6–33.0)
MCHC: 29.2 g/dL — ABNORMAL LOW (ref 31.5–35.7)
MCV: 82 fL (ref 79–97)
Platelets: 230 10*3/uL (ref 150–450)
RBC: 4.62 x10E6/uL (ref 3.77–5.28)
RDW: 14.4 % (ref 11.7–15.4)
WBC: 4.4 10*3/uL (ref 3.4–10.8)

## 2022-03-06 LAB — LIPID PANEL
Chol/HDL Ratio: 2.6 ratio (ref 0.0–4.4)
Cholesterol, Total: 178 mg/dL (ref 100–199)
HDL: 69 mg/dL (ref >39)
LDL Chol Calc (NIH): 99 mg/dL (ref 0–99)
Triglycerides: 53 mg/dL (ref 0–149)
VLDL Cholesterol Cal: 10 mg/dL (ref 5–40)

## 2022-03-06 LAB — MICROALBUMIN / CREATININE URINE RATIO
Creatinine, Urine: 50.1 mg/dL
Microalb/Creat Ratio: <6 mg/g creat (ref 0–29)
Microalbumin, Urine: <3 ug/mL

## 2022-03-08 ENCOUNTER — Encounter: Payer: Self-pay | Admitting: Nurse Practitioner

## 2022-03-19 DIAGNOSIS — Z86010 Personal history of colonic polyps: Secondary | ICD-10-CM | POA: Diagnosis not present

## 2022-03-19 DIAGNOSIS — E669 Obesity, unspecified: Secondary | ICD-10-CM | POA: Diagnosis not present

## 2022-03-19 DIAGNOSIS — K5904 Chronic idiopathic constipation: Secondary | ICD-10-CM | POA: Diagnosis not present

## 2022-04-02 ENCOUNTER — Other Ambulatory Visit: Payer: Self-pay

## 2022-04-02 DIAGNOSIS — Z780 Asymptomatic menopausal state: Secondary | ICD-10-CM

## 2022-04-02 DIAGNOSIS — F3342 Major depressive disorder, recurrent, in full remission: Secondary | ICD-10-CM

## 2022-04-02 MED ORDER — TELMISARTAN 40 MG PO TABS: 40.0000 mg | ORAL_TABLET | Freq: Every day | ORAL | 0 refills | Status: DC

## 2022-04-02 MED ORDER — CITALOPRAM HYDROBROMIDE 40 MG PO TABS: 40.0000 mg | ORAL_TABLET | Freq: Every day | ORAL | 0 refills | Status: DC

## 2022-04-02 MED ORDER — ALENDRONATE SODIUM 70 MG PO TABS: ORAL_TABLET | ORAL | 0 refills | Status: DC

## 2022-04-09 ENCOUNTER — Ambulatory Visit: Payer: BC Managed Care – PPO | Admitting: Psychiatry

## 2022-04-09 ENCOUNTER — Telehealth: Payer: Self-pay | Admitting: Psychiatry

## 2022-04-09 VITALS — BP 152/93 | HR 61 | Ht 65.0 in | Wt 178.0 lb

## 2022-04-09 DIAGNOSIS — G629 Polyneuropathy, unspecified: Secondary | ICD-10-CM | POA: Diagnosis not present

## 2022-04-09 DIAGNOSIS — H539 Unspecified visual disturbance: Secondary | ICD-10-CM

## 2022-04-09 DIAGNOSIS — H547 Unspecified visual loss: Secondary | ICD-10-CM

## 2022-04-09 DIAGNOSIS — G43109 Migraine with aura, not intractable, without status migrainosus: Secondary | ICD-10-CM

## 2022-04-09 MED ORDER — GABAPENTIN 300 MG PO CAPS: 300.0000 mg | ORAL_CAPSULE | Freq: Three times a day (TID) | ORAL | 5 refills | Status: DC

## 2022-04-09 MED ORDER — LORAZEPAM 0.5 MG PO TABS: ORAL_TABLET | ORAL | 0 refills | Status: AC

## 2022-04-09 NOTE — Progress Notes (Signed)
Referring:  [name], [address] [place],  [place] [address]  PCP: [name], [place]  Neurology was asked to evaluate Danielle Calderon, a 61 year old female for a chief complaint of headaches.  Our recommendations of care will be communicated by shared medical record.    CC:  headaches  History provided from self  HPI:  Medical co-morbidities: HLD, HTN, DM, neuropathy  The patient presents for evaluation of headaches which began in 2000. In the past 2 months she has started to see circles in her left eye. She has also developed numbness on the left side of her face. She is not sure if these are always present because she is not always paying attention to these symptoms. States she can typically notice them if she sits and focuses. She also reports a mild headache every day (1-2/10 pain). Occasionally will have more severe 7-8/10 headaches as well.  Takes gabapentin 300 mg BID for headaches and neuropathy. This does help with the neuropathy but she continues to have lower level headaches daily. It does not cause side effects.  Headache History: Onset: 2000 Triggers: none Aura: circles in vision, left face numbness Location: frontal Quality/Description: pressure Associated Symptoms:  Photophobia: no  Phonophobia: no  Nausea: yes Other symptoms: dizziness Worse with activity?: no Duration of headaches: 24 hours  Headache days per month: 30 Migraine days per month: 1 Headache free days per month: 0  Current Treatment: Abortive none  Preventative Gabapentin 300 mg BID  Prior Therapies                                 Lisinopril - cough Telmisartan 40 mg daily Gabapentin 300 mg BID Citalopram 40 mg daily   LABS: CBC    Component Value Date/Time   WBC 4.4 03/05/2022 1238   WBC 6.2 03/22/2020 1042   RBC 4.62 03/05/2022 1238   RBC 4.60 03/22/2020 1042   HGB 11.1 03/05/2022 1238   HCT 38.0 03/05/2022 1238   PLT 230 03/05/2022 1238   MCV 82  03/05/2022 1238   MCH 24.0 (L) 03/05/2022 1238   MCH 26.1 03/22/2020 1042   MCHC 29.2 (L) 03/05/2022 1238   MCHC 31.3 03/22/2020 1042   RDW 14.4 03/05/2022 1238   LYMPHSABS 2.1 01/19/2019 1121   MONOABS 0.5 10/07/2007 2127   EOSABS 0.1 01/19/2019 1121   BASOSABS 0.0 01/19/2019 1121      Latest Ref Rng & Units 03/05/2022   12:38 PM 08/20/2021   11:34 AM 02/08/2021   12:26 PM  CMP  Glucose 70 - 99 mg/dL 89  80  85   BUN 8 - 27 mg/dL '13  11  7   '$ Creatinine 0.57 - 1.00 mg/dL 0.81  0.76  0.74   Sodium 134 - 144 mmol/L 143  140  137   Potassium 3.5 - 5.2 mmol/L 4.1  3.6  4.1   Chloride 96 - 106 mmol/L 105  99  100   CO2 20 - 29 mmol/L '25  28  25   '$ Calcium 8.7 - 10.3 mg/dL 8.8  9.7  9.4   Total Protein 6.0 - 8.5 g/dL 7.2   7.3   Total Bilirubin 0.0 - 1.2 mg/dL 0.3   0.4   Alkaline Phos 44 - 121 IU/L 55   62   AST 0 - 40 IU/L 37   20   ALT 0 - 32 IU/L  46   18      IMAGING:  MRI brain, [facility] 2011: unremarkable brain, no cord compression or foraminal stenosis  Current Outpatient Medications on File Prior to Visit  Medication Sig Dispense Refill   alendronate (FOSAMAX) 70 MG tablet Take with a full glass of water on an empty stomach. 4 tablet 0   calcium carbonate 1250 MG capsule Take 1,200 mg by mouth at bedtime.      cholecalciferol (VITAMIN D) 1000 UNITS tablet Take 2,000 Units by mouth at bedtime.      citalopram (CELEXA) 40 MG tablet Take 1 tablet (40 mg total) by mouth at bedtime. 90 tablet 0   diphenhydrAMINE (SOMINEX) 25 MG tablet Take 25 mg by mouth at bedtime as needed for allergies.     metFORMIN (GLUCOPHAGE) 500 MG tablet TAKE 1 TABLET BY MOUTH TWICE DAILY WITH A MEAL 180 tablet 0   omeprazole (PRILOSEC) 20 MG capsule Take 20 mg by mouth daily.     rosuvastatin (CRESTOR) 5 MG tablet TAKE 1 TABLET BY MOUTH EVERYDAY AT BEDTIME 90 tablet 1   Semaglutide, 1 MG/DOSE, 4 MG/3ML SOPN Inject 1 mg into the skin once a week. 9 mL 1   telmisartan (MICARDIS) 40 MG tablet Take 1  tablet (40 mg total) by mouth daily. 90 tablet 0   amoxicillin-clavulanate (AUGMENTIN) 875-125 MG tablet Take 1 tablet by mouth 2 (two) times daily. (Patient not taking: Reported on 03/05/2022) 14 tablet 0   benzonatate (TESSALON) 100 MG capsule Take 1 capsule (100 mg total) by mouth every 8 (eight) hours. (Patient not taking: Reported on 03/05/2022) 21 capsule 0   No current facility-administered medications on file prior to visit.     Allergies: Allergies  Allergen Reactions   Lisinopril Cough    Other reaction(s): Cough (ALLERGY/intolerance)    Family History: Migraine or other headaches in the family:  no Aneurysms in a first degree relative:  no Brain tumors in the family:  no Other neurological illness in the family:   no  Past Medical History: Past Medical History:  Diagnosis Date   Anxiety    Depression    Diabetes mellitus without complication ([place])    Type II   GERD (gastroesophageal reflux disease)    Headache    mild daily takes gabapentin   History of migraine headaches    Hyperlipidemia    Hypertension     Past Surgical History Past Surgical History:  Procedure Laterality Date   COLONOSCOPY W/ POLYPECTOMY     DILATATION & CURETTAGE/HYSTEROSCOPY WITH MYOSURE N/A 03/14/2020   Procedure: DILATATION & CURETTAGE/HYSTEROSCOPY WITH MYOSURE;  Surgeon: Sloan Leiter, MD;  Location: [facility];  Service: Gynecology;  Laterality: N/A;   ROBOTIC ASSISTED TOTAL HYSTERECTOMY WITH BILATERAL SALPINGO OOPHERECTOMY N/A 03/27/2020   Procedure: XI ROBOTIC ASSISTED TOTAL HYSTERECTOMY WITH BILATERAL SALPINGO OOPHORECTOMY;  Surgeon: Lafonda Mosses, MD;  Location: [facility];  Service: Gynecology;  Laterality: N/A;    Social History: Social History   Tobacco Use   Smoking status: Former    Years: 17.00    Types: Cigarettes    Quit date: 2004    Years since quitting: 19.7   Smokeless tobacco: Never  Vaping Use   Vaping Use: Never used  Substance Use Topics    Alcohol use: Yes    Alcohol/week: 2.0 standard drinks of alcohol    Types: 2 Glasses of wine per week   Drug use: No    ROS: Negative for fevers, chills.  Positive for headaches, vision changes. All other systems reviewed and negative unless stated otherwise in HPI.   Physical Exam:   Vital Signs: BP (!) 152/93   Pulse 61   Ht '5\' 5"'$  (1.651 m)   Wt 178 lb (80.7 kg)   BMI 29.62 kg/m  GENERAL: well appearing,in no acute distress,alert SKIN:  Color, texture, turgor normal. No rashes or lesions HEAD:  Normocephalic/atraumatic. CV:  RRR RESP: Normal respiratory effort MSK: no tenderness to palpation over occiput, neck, or shoulders  NEUROLOGICAL: Mental Status: Alert, oriented to person, place and time,Follows commands Cranial Nerves: PERRL, visual fields intact to confrontation, extraocular movements intact, facial sensation intact, no facial droop or ptosis, hearing grossly intact, no dysarthria Motor: muscle strength 5/5 both upper and lower extremities,no drift, normal tone Reflexes: 2+ throughout Sensation: intact to light touch all 4 extremities Coordination: Finger-to- nose-finger intact bilaterally Gait: normal-based   IMPRESSION: 61 year old female with a history of HLD, HTN, DM, neuropathy who presents for evaluation of headaches with persistent vision changes and left face numbness. Will order brain MRI to assess for structural causes of new neurologic deficits. If imaging is normal, symptoms may represent visual and sensory migraine aura. She continues to have mild headaches with gabapentin, will increase dosing to TID which may also help with aura.   PLAN: -MRI brain -Increase gabapentin to 300 mg TID, uptitrate further as needed -Next steps: consider PRN triptan if MRI normal   I spent a total of 27 minutes chart reviewing and counseling the patient. Headache education was done. Discussed treatment options including preventive medications.  Discussed medication  side effects, adverse reactions and drug interactions. Written educational materials and patient instructions outlining all of the above were given.  Follow-up: 3 months   Genia Harold, MD 04/09/2022   10:47 AM

## 2022-04-09 NOTE — Telephone Encounter (Signed)
MR brain w/wo contrast scheduled at [facility] for 04/16/22 at 2:30 pm.  Lorella Nimrod auth: [id number] (04/09/22-05/08/22).

## 2022-04-16 ENCOUNTER — Ambulatory Visit: Payer: BC Managed Care – PPO

## 2022-04-16 DIAGNOSIS — H539 Unspecified visual disturbance: Secondary | ICD-10-CM

## 2022-04-16 NOTE — Telephone Encounter (Signed)
She is scheduled for a sedated MRI on 11/21 I will redo her authorization the week before.

## 2022-04-16 NOTE — Telephone Encounter (Signed)
New order sent for MRI with sedation, thanks

## 2022-04-16 NOTE — Addendum Note (Signed)
Addended by: Genia Harold on: 04/16/2022 02:52 PM   Modules accepted: Orders

## 2022-04-16 NOTE — Telephone Encounter (Signed)
The patient was here for her MRI today and wasn't able to get it done. She wants to do a sedated MRI at the hospital. If you agree, please change the order and I will send it there. Thanks

## 2022-04-29 NOTE — Telephone Encounter (Signed)
H & P paperwork filled out and faxed to radiology scheduling

## 2022-05-09 ENCOUNTER — Other Ambulatory Visit: Payer: Self-pay

## 2022-05-09 DIAGNOSIS — M8580 Other specified disorders of bone density and structure, unspecified site: Secondary | ICD-10-CM

## 2022-05-09 MED ORDER — METFORMIN HCL 500 MG PO TABS: 500.0000 mg | ORAL_TABLET | Freq: Two times a day (BID) | ORAL | 0 refills | Status: DC

## 2022-05-09 MED ORDER — ALENDRONATE SODIUM 70 MG PO TABS: ORAL_TABLET | ORAL | 0 refills | Status: DC

## 2022-05-16 NOTE — Telephone Encounter (Signed)
Dillon Bjork: [id number] exp. 05/13/22-06/11/22 for [facility]

## 2022-05-20 ENCOUNTER — Other Ambulatory Visit: Payer: Self-pay

## 2022-05-20 ENCOUNTER — Encounter (HOSPITAL_COMMUNITY): Payer: Self-pay | Admitting: Psychiatry

## 2022-05-20 MED ORDER — PHENYLEPHRINE HCL-NACL 20-0.9 MG/250ML-% IV SOLN
INTRAVENOUS | Status: AC
  Filled 2022-05-20: qty 250

## 2022-05-20 NOTE — Progress Notes (Signed)
PCP - [name], FNP  EKG - 03/05/22  Semaglutide: Last dose 11/14  ERAS Protcol - Clears until 0500  Anesthesia review: N  Patient verbally denies any shortness of breath, fever, cough and chest pain during phone call   -------------  SDW INSTRUCTIONS given:  Your procedure is scheduled on 05/21/22.  Report to [facility] Main Entrance "A" at 0545 A.M., and check in at the Admitting office.  Call this number if you have problems the morning of surgery:  [phone]   Remember:  Do not eat after midnight the night before your surgery  You may drink clear liquids until 0500 the morning of your surgery.   Clear liquids allowed are: Water, Non-Citrus Juices (without pulp), Carbonated Beverages, Clear Tea, Black Coffee Only, and Gatorade    Take these medicines the morning of surgery with A SIP OF WATER  gabapentin (NEURONTIN)  omeprazole (PRILOSEC)    As of today, STOP taking any Aspirin (unless otherwise instructed by your surgeon) Aleve, Naproxen, Ibuprofen, Motrin, Advil, Goody's, BC's, all herbal medications, fish oil, and all vitamins.                      Do not wear jewelry, make up, or nail polish            Do not wear lotions, powders, perfumes/colognes, or deodorant.            Do not shave 48 hours prior to surgery.  Men may shave face and neck.            Do not bring valuables to the hospital.            [facility] is not responsible for any belongings or valuables.  Do NOT Smoke (Tobacco/Vaping) 24 hours prior to your procedure If you use a CPAP at night, you may bring all equipment for your overnight stay.   Contacts, glasses, dentures or bridgework may not be worn into surgery.      For patients admitted to the hospital, discharge time will be determined by your treatment team.   Patients discharged the day of surgery will not be allowed to drive home, and someone needs to stay with them for 24 hours.    Special instructions:   [facility]-  Preparing For Surgery  Before surgery, you can play an important role. Because skin is not sterile, your skin needs to be as free of germs as possible. You can reduce the number of germs on your skin by washing with CHG (chlorahexidine gluconate) Soap before surgery.  CHG is an antiseptic cleaner which kills germs and bonds with the skin to continue killing germs even after washing.    Oral Hygiene is also important to reduce your risk of infection.  Remember - BRUSH YOUR TEETH THE MORNING OF SURGERY WITH YOUR REGULAR TOOTHPASTE  Please do not use if you have an allergy to CHG or antibacterial soaps. If your skin becomes reddened/irritated stop using the CHG.  Do not shave (including legs and underarms) for at least 48 hours prior to first CHG shower. It is OK to shave your face.  Please follow these instructions carefully.   Shower the NIGHT BEFORE SURGERY and the MORNING OF SURGERY with DIAL Soap.   Pat yourself dry with a CLEAN TOWEL.  Wear CLEAN PAJAMAS to bed the night before surgery  Place CLEAN SHEETS on your bed the night of your first shower and DO NOT SLEEP WITH PETS.  Day of Surgery: Please shower morning of surgery  Wear Clean/Comfortable clothing the morning of surgery Do not apply any deodorants/lotions.   Remember to brush your teeth WITH YOUR REGULAR TOOTHPASTE.   Questions were answered. Patient verbalized understanding of instructions.

## 2022-05-20 NOTE — Anesthesia Preprocedure Evaluation (Signed)
Anesthesia Evaluation  Patient identified by MRN, date of birth, ID band Patient awake    Reviewed: Allergy & Precautions, NPO status , Patient's Chart, lab work & pertinent test results  History of Anesthesia Complications Negative for: history of anesthetic complications  Airway Mallampati: II  TM Distance: >3 FB Neck ROM: Full    Dental  (+) Missing,    Pulmonary former smoker   Pulmonary exam normal        Cardiovascular hypertension, Pt. on medications Normal cardiovascular exam     Neuro/Psych  Headaches  Anxiety Depression    Vision loss    GI/Hepatic Neg liver ROS,GERD  Medicated and Controlled,,  Endo/Other  diabetes, Type 2, Oral Hypoglycemic Agents  On weekly semaglutide (last dose 05/14/22)  Renal/GU negative Renal ROS  negative genitourinary   Musculoskeletal negative musculoskeletal ROS (+)    Abdominal   Peds  Hematology negative hematology ROS (+)   Anesthesia Other Findings Day of surgery medications reviewed with patient.  Reproductive/Obstetrics negative OB ROS                              Anesthesia Physical Anesthesia Plan  ASA: 2  Anesthesia Plan: General   Post-op Pain Management: Minimal or no pain anticipated   Induction: Intravenous  PONV Risk Score and Plan: 3 and Treatment may vary due to age or medical condition, Ondansetron, Dexamethasone and Midazolam  Airway Management Planned: Oral ETT  Additional Equipment: None  Intra-op Plan:   Post-operative Plan: Extubation in OR  Informed Consent: I have reviewed the patients History and Physical, chart, labs and discussed the procedure including the risks, benefits and alternatives for the proposed anesthesia with the patient or authorized representative who has indicated his/her understanding and acceptance.     Dental advisory given  Plan Discussed with: CRNA  Anesthesia Plan Comments:           Anesthesia Quick Evaluation

## 2022-05-21 ENCOUNTER — Encounter (HOSPITAL_COMMUNITY): Payer: Self-pay | Admitting: Psychiatry

## 2022-05-21 ENCOUNTER — Encounter (HOSPITAL_COMMUNITY)
Admission: RE | Disposition: A | Discharge status: Home / Self Care | Payer: Self-pay | Source: Home / Self Care | Attending: Psychiatry

## 2022-05-21 ENCOUNTER — Ambulatory Visit (HOSPITAL_COMMUNITY): Payer: BC Managed Care – PPO | Admitting: Anesthesiology

## 2022-05-21 ENCOUNTER — Ambulatory Visit (HOSPITAL_COMMUNITY)
Admission: RE | Admit: 2022-05-21 | Discharge: 2022-05-21 | Disposition: A | Discharge status: Home / Self Care | Payer: BC Managed Care – PPO | Source: Ambulatory Visit | Attending: Psychiatry | Admitting: Psychiatry

## 2022-05-21 ENCOUNTER — Ambulatory Visit (HOSPITAL_COMMUNITY)
Admission: RE | Admit: 2022-05-21 | Discharge: 2022-05-21 | Disposition: A | Discharge status: Home / Self Care | Payer: BC Managed Care – PPO | Attending: Psychiatry | Admitting: Psychiatry

## 2022-05-21 DIAGNOSIS — H547 Unspecified visual loss: Secondary | ICD-10-CM

## 2022-05-21 HISTORY — PX: RADIOLOGY WITH ANESTHESIA: SHX6223

## 2022-05-21 LAB — BASIC METABOLIC PANEL
Anion gap: 11 (ref 5–15)
BUN: 8 mg/dL (ref 8–23)
CO2: 28 mmol/L (ref 22–32)
Calcium: 9.3 mg/dL (ref 8.9–10.3)
Chloride: 101 mmol/L (ref 98–111)
Creatinine, Ser: 0.77 mg/dL (ref 0.44–1.00)
GFR, Estimated: >60 mL/min (ref >60)
Glucose, Bld: 102 mg/dL — ABNORMAL HIGH (ref 70–99)
Potassium: 3.5 mmol/L (ref 3.5–5.1)
Sodium: 140 mmol/L (ref 135–145)

## 2022-05-21 LAB — CBC
HCT: 35.9 % — ABNORMAL LOW (ref 36.0–46.0)
Hemoglobin: 11.4 g/dL — ABNORMAL LOW (ref 12.0–15.0)
MCH: 25.9 pg — ABNORMAL LOW (ref 26.0–34.0)
MCHC: 31.8 g/dL (ref 30.0–36.0)
MCV: 81.4 fL (ref 80.0–100.0)
Platelets: 226 10*3/uL (ref 150–400)
RBC: 4.41 MIL/uL (ref 3.87–5.11)
RDW: 14 % (ref 11.5–15.5)
WBC: 4.8 10*3/uL (ref 4.0–10.5)
nRBC: 0 % (ref 0.0–0.2)

## 2022-05-21 LAB — GLUCOSE, CAPILLARY
Glucose-Capillary: 107 mg/dL — ABNORMAL HIGH (ref 70–99)
Glucose-Capillary: 75 mg/dL (ref 70–99)

## 2022-05-21 SURGERY — MRI WITH ANESTHESIA
Anesthesia: General

## 2022-05-21 MED ORDER — AMISULPRIDE (ANTIEMETIC) 5 MG/2ML IV SOLN: 10.0000 mg | Freq: Once | INTRAVENOUS | Status: DC | PRN

## 2022-05-21 MED ORDER — GADOBUTROL 1 MMOL/ML IV SOLN
8.0000 mL | Freq: Once | INTRAVENOUS | Status: AC | PRN
  Administered 2022-05-21: 8 mL via INTRAVENOUS

## 2022-05-21 MED ORDER — LACTATED RINGERS IV SOLN: INTRAVENOUS | Status: DC

## 2022-05-21 MED ORDER — CHLORHEXIDINE GLUCONATE 0.12 % MT SOLN
15.0000 mL | Freq: Once | OROMUCOSAL | Status: AC
  Administered 2022-05-21: 15 mL via OROMUCOSAL
  Filled 2022-05-21: qty 15

## 2022-05-21 MED ORDER — ORAL CARE MOUTH RINSE: 15.0000 mL | Freq: Once | OROMUCOSAL | Status: AC

## 2022-05-21 MED ORDER — MIDAZOLAM HCL 2 MG/2ML IJ SOLN
INTRAMUSCULAR | Status: DC | PRN
  Administered 2022-05-21: 2 mg via INTRAVENOUS
  Administered 2022-05-21 (×2): 1 mg via INTRAVENOUS

## 2022-05-21 MED ORDER — INSULIN ASPART 100 UNIT/ML IJ SOLN: 0.0000 [IU] | INTRAMUSCULAR | Status: DC | PRN

## 2022-05-21 NOTE — Transfer of Care (Signed)
Immediate Anesthesia Transfer of Care Note  Patient: Sheila Ocasio  Procedure(s) Performed: MRI BRAIN WITH AND WITHOUT CONTRAST WITH ANESTHESIA  Patient Location: PACU  Anesthesia Type:MAC  Level of Consciousness: awake, alert , and oriented  Airway & Oxygen Therapy: Patient Spontanous Breathing and Patient connected to nasal cannula oxygen  Post-op Assessment: Report given to RN and Post -op Vital signs reviewed and stable  Post vital signs: Reviewed and stable  Last Vitals:  Vitals Value Taken Time  BP 136/89 05/21/22 0859  Temp    Pulse 65 05/21/22 0900  Resp 16 05/21/22 0900  SpO2 99 % 05/21/22 0900  Vitals shown include unvalidated device data.  Last Pain:  Vitals:   05/21/22 0659  TempSrc:   PainSc: 0-No pain         Complications: No notable events documented.

## 2022-05-21 NOTE — Anesthesia Postprocedure Evaluation (Signed)
Anesthesia Post Note  Patient: Evalin Shawhan  Procedure(s) Performed: MRI BRAIN WITH AND WITHOUT CONTRAST WITH ANESTHESIA     Patient location during evaluation: PACU Anesthesia Type: MAC Level of consciousness: awake and alert Pain management: pain level controlled Vital Signs Assessment: post-procedure vital signs reviewed and stable Respiratory status: spontaneous breathing, nonlabored ventilation and respiratory function stable Cardiovascular status: blood pressure returned to baseline Postop Assessment: no apparent nausea or vomiting Anesthetic complications: no   No notable events documented.  Last Vitals:  Vitals:   05/21/22 0900 05/21/22 0915  BP: 138/88 (!) 149/84  Pulse: 65 63  Resp: 16 13  Temp: 36.5 C 36.7 C  SpO2: 99% 98%    Last Pain:  Vitals:   05/21/22 0900  TempSrc:   PainSc: 0-No pain                 Marthenia Rolling

## 2022-05-22 ENCOUNTER — Encounter (HOSPITAL_COMMUNITY): Payer: Self-pay | Admitting: Radiology

## 2022-05-30 ENCOUNTER — Encounter: Payer: Self-pay | Admitting: Nurse Practitioner

## 2022-05-30 ENCOUNTER — Other Ambulatory Visit (HOSPITAL_COMMUNITY)
Admission: RE | Admit: 2022-05-30 | Discharge: 2022-05-30 | Disposition: A | Discharge status: Home / Self Care | Payer: BC Managed Care – PPO | Source: Ambulatory Visit | Attending: Nurse Practitioner | Admitting: Nurse Practitioner

## 2022-05-30 ENCOUNTER — Ambulatory Visit: Payer: BC Managed Care – PPO | Admitting: Nurse Practitioner

## 2022-05-30 VITALS — BP 130/78 | HR 73 | Temp 97.7°F | Ht 65.0 in | Wt 175.0 lb

## 2022-05-30 DIAGNOSIS — I10 Essential (primary) hypertension: Secondary | ICD-10-CM | POA: Diagnosis not present

## 2022-05-30 DIAGNOSIS — N939 Abnormal uterine and vaginal bleeding, unspecified: Secondary | ICD-10-CM | POA: Diagnosis not present

## 2022-05-30 NOTE — Progress Notes (Signed)
I,[name],acting as a Education administrator for [employer], FNP.,have documented all relevant documentation on the behalf of [name], FNP,as directed by  [name], FNP while in the presence of [name], [place].  Subjective:     Patient ID: Danielle Calderon , female    DOB: 1960/09/12 , 61 y.o.   MRN: [id number]   Chief Complaint  Patient presents with   Vaginal Bleeding    HPI  Patient presents today for vaginal bleeding. Noticed bright red blood and is having cramping on yesterday. She went to Dr. Jodi Mourning office 2 months ago and was told since she had a robotic total hysterectomy she does not need any additional PAPs.   Vaginal Bleeding The patient's pertinent negatives include no genital itching. This is a new problem. She is not pregnant. Pertinent negatives include no abdominal pain or anorexia. She has tried nothing for the symptoms. She uses nothing for contraception. Her past medical history is significant for an abdominal surgery (robotic total hysterectomy 2021).     Past Medical History:  Diagnosis Date   Anxiety    Depression    Diabetes mellitus without complication (HCC)    Type II   GERD (gastroesophageal reflux disease)    Headache    mild daily takes gabapentin   History of migraine headaches    Hyperlipidemia    Hypertension      Family History  Problem Relation Age of Onset   Hypertension Mother    Hypertension Father    Cancer Father    Diabetes Father    Breast cancer Maternal Aunt    AAA (abdominal aortic aneurysm) Maternal Aunt    Cancer Maternal Great-grandmother        cervical vs. uterine (sounds like cervical, treated with cryotherapy)   Breast cancer Cousin        maternal     Current Outpatient Medications:    alendronate (FOSAMAX) 70 MG tablet, Take with a full glass of water on an empty stomach. (Patient taking differently: Take 70 mg by mouth every Monday. Take with a full glass of water on an empty stomach.), Disp: 4 tablet, Rfl: 0    calcium carbonate (OS-CAL) 600 MG TABS tablet, Take 1,200 mg by mouth at bedtime., Disp: , Rfl:    Cholecalciferol (VITAMIN D) 50 MCG (2000 UT) CAPS, Take 2,000 Units by mouth at bedtime., Disp: , Rfl:    citalopram (CELEXA) 40 MG tablet, Take 1 tablet (40 mg total) by mouth at bedtime., Disp: 90 tablet, Rfl: 0   diphenhydrAMINE (SOMINEX) 25 MG tablet, Take 25 mg by mouth at bedtime as needed for allergies or sleep., Disp: , Rfl:    gabapentin (NEURONTIN) 300 MG capsule, Take 1 capsule (300 mg total) by mouth 3 (three) times daily., Disp: 90 capsule, Rfl: 5   LORazepam (ATIVAN) 0.5 MG tablet, Take 1-2 pills 30 minutes prior to MRI, Disp: 2 tablet, Rfl: 0   metFORMIN (GLUCOPHAGE) 500 MG tablet, Take 1 tablet (500 mg total) by mouth 2 (two) times daily with a meal for 180 doses., Disp: 180 tablet, Rfl: 0   omeprazole (PRILOSEC) 20 MG capsule, Take 20 mg by mouth daily., Disp: , Rfl:    rosuvastatin (CRESTOR) 5 MG tablet, TAKE 1 TABLET BY MOUTH EVERYDAY AT BEDTIME, Disp: 90 tablet, Rfl: 1   Semaglutide, 1 MG/DOSE, 4 MG/3ML SOPN, Inject 1 mg into the skin once a week. (Patient taking differently: Inject 1 mg into the skin every Tuesday.), Disp: 9 mL, Rfl: 1  telmisartan (MICARDIS) 40 MG tablet, Take 1 tablet (40 mg total) by mouth daily., Disp: 90 tablet, Rfl: 0   Allergies  Allergen Reactions   Lisinopril Cough     Review of Systems  Constitutional: Negative.   Respiratory: Negative.    Cardiovascular: Negative.   Gastrointestinal: Negative.  Negative for abdominal pain and anorexia.  Genitourinary:  Positive for vaginal bleeding.  Neurological: Negative.      Today's Vitals   05/30/22 1545  BP: 130/78  Pulse: 73  Temp: 97.7 F (36.5 C)  TempSrc: Oral  Weight: 175 lb (79.4 kg)  Height: '5\' 5"'$  (1.651 m)   Body mass index is 29.12 kg/m.   Objective:  Physical Exam Vitals reviewed.  Constitutional:      General: She is not in acute distress.    Appearance: Normal appearance.   HENT:     Head: Normocephalic.  Eyes:     Pupils: Pupils are equal, round, and reactive to light.  Cardiovascular:     Rate and Rhythm: Normal rate and regular rhythm.     Pulses: Normal pulses.     Heart sounds: Normal heart sounds. No murmur heard. Pulmonary:     Effort: Pulmonary effort is normal. No respiratory distress.     Breath sounds: Normal breath sounds. No wheezing.  Genitourinary:    General: Normal vulva.     Vagina: Normal.     Comments: No cervix present Skin:    General: Skin is warm and dry.     Capillary Refill: Capillary refill takes less than 2 seconds.  Neurological:     General: No focal deficit present.     Mental Status: She is alert and oriented to person, place, and time.     Cranial Nerves: No cranial nerve deficit.     Motor: No weakness.  Psychiatric:        Mood and Affect: Mood normal.        Behavior: Behavior normal.        Thought Content: Thought content normal.        Judgment: Judgment normal.         Assessment And Plan:     1. Vaginal bleeding Comments: Will check for any STDs. No obvious blood noted on exam. She has had a total hysterectomy no longer getting PAPs - Cervicovaginal ancillary only  2. Essential hypertension Comments: Encouraged to focus on lifestyle changes     Patient was given opportunity to ask questions. Patient verbalized understanding of the plan and was able to repeat key elements of the plan. All questions were answered to their satisfaction.  [name], FNP   I, [name], FNP, have reviewed all documentation for this visit. The documentation on 05/30/22 for the exam, diagnosis, procedures, and orders are all accurate and complete.   IF YOU HAVE BEEN REFERRED TO A SPECIALIST, IT MAY TAKE 1-2 WEEKS TO SCHEDULE/PROCESS THE REFERRAL. IF YOU HAVE NOT HEARD FROM US/SPECIALIST IN TWO WEEKS, PLEASE GIVE [place] A CALL AT [phone].   THE PATIENT IS ENCOURAGED TO PRACTICE SOCIAL DISTANCING DUE TO THE  COVID-19 PANDEMIC.

## 2022-05-30 NOTE — Patient Instructions (Signed)
Abnormal Uterine Bleeding ? ?Abnormal uterine bleeding is unusual bleeding from the uterus. It includes bleeding after sex, or bleeding or spotting between menstrual periods. It may also include bleeding that is heavier than normal, menstrual periods that last longer than usual, or bleeding that occurs after menopause. ?Abnormal uterine bleeding can affect teenagers, women in their reproductive years, pregnant women, and women who have reached menopause. Common causes of abnormal uterine bleeding include: ?Pregnancy. ?Abnormal growths within the lining of the uterus (polyps). ?Benign tumors or growths in the uterus (fibroids). These are not cancer. ?Infection. ?Cancer. ?Too much or too little of some hormones in the body (hormonal imbalances). ?Any type of abnormal bleeding should be checked by a health care provider. Many cases are minor and simple to treat, but others may be more serious. Treatment will depend on the cause of the bleeding and how severe it is. ?Follow these instructions at home: ?Medicines ?Take over-the-counter and prescription medicines only as told by your health care provider. ?Ask your health care provider about: ?Taking medicines such as aspirin and ibuprofen. These medicines can thin your blood. Do not take these medicines unless your health care provider tells you to take them. ?Taking over-the-counter medicines, vitamins, herbs, and supplements. ?If you were prescribed iron pills, take them as told by your health care provider. Iron pills help to replace iron that your body loses because of this condition. ?Managing constipation ?In cases of severe bleeding, you may be asked to increase your iron intake to treat anemia. Doing this may cause constipation. To prevent or treat constipation, you may need to: ?Drink enough fluid to keep your urine pale yellow. ?Take over-the-counter or prescription medicines. ?Eat foods that are high in fiber, such as beans, whole grains, and fresh fruits and  vegetables. ?Limit foods that are high in fat and processed sugars, such as fried or sweet foods. ?Activity ?Alter your activity to decrease bleeding if you need to change your sanitary pad more than one time every 2 hours: ?Lie in bed with your feet raised (elevated). ?Place a cold pack on your lower abdomen. ?Rest as much as possible until the bleeding stops or slows down. ?General instructions ?Do not use tampons, douche, or have sex until your health care provider says these things are okay. ?Change your sanitary pads often. ?Get regular exams. These include pelvic exams and cervical cancer screenings. ?It is up to you to get the results of any tests that are done. Ask your health care provider, or the department that is doing the tests, when your results will be ready. ?Monitor your condition for any changes. For 2 months, write down: ?When your menstrual period starts. ?When your menstrual period ends. ?When any abnormal vaginal bleeding occurs. ?What problems you notice. ?Keep all follow-up visits. This is important. ?Contact a health care provider if: ?You have bleeding that lasts for more than one week. ?You feel dizzy at times. ?You feel nauseous or you vomit. ?You feel light-headed or weak. ?You notice any other changes that show that your condition is getting worse. ?Get help right away if: ?You faint. ?You have bleeding that soaks through a sanitary pad every hour. ?You have pain in the abdomen. ?You have a fever or chills. ?You become sweaty or weak. ?You pass large blood clots from your vagina. ?These symptoms may represent a serious problem that is an emergency. Do not wait to see if the symptoms will go away. Get medical help right away. Call your local emergency services (  911 in the [place] Do not drive yourself to the hospital. ?Summary ?Abnormal uterine bleeding is unusual bleeding from the uterus. ?Any type of abnormal bleeding should be checked by a health care provider. Many cases are minor and  simple to treat, but others may be more serious. ?Treatment will depend on the cause of the bleeding and how severe it is. ?Get help right away if you faint, you have bleeding that soaks through a sanitary pad every hour, or you pass large blood clots from your vagina. ?This information is not intended to replace advice given to you by your health care provider. Make sure you discuss any questions you have with your health care provider. ?Document Revised: 10/17/2020 Document Reviewed: 10/17/2020 ?Elsevier Patient Education ? 2023 Elsevier Inc[employer] ? ?

## 2022-06-03 LAB — CERVICOVAGINAL ANCILLARY ONLY
Bacterial Vaginitis (gardnerella): NEGATIVE
Candida Glabrata: NEGATIVE
Candida Vaginitis: NEGATIVE
Comment: NEGATIVE
Comment: NEGATIVE
Comment: NEGATIVE

## 2022-06-05 ENCOUNTER — Other Ambulatory Visit: Payer: Self-pay

## 2022-06-05 DIAGNOSIS — N939 Abnormal uterine and vaginal bleeding, unspecified: Secondary | ICD-10-CM | POA: Diagnosis not present

## 2022-06-07 LAB — URINE CULTURE

## 2022-06-21 ENCOUNTER — Other Ambulatory Visit: Payer: Self-pay | Admitting: Nurse Practitioner

## 2022-06-21 DIAGNOSIS — Z780 Asymptomatic menopausal state: Secondary | ICD-10-CM

## 2022-07-01 ENCOUNTER — Other Ambulatory Visit: Payer: Self-pay | Admitting: Nurse Practitioner

## 2022-07-01 DIAGNOSIS — E782 Mixed hyperlipidemia: Secondary | ICD-10-CM

## 2022-07-08 ENCOUNTER — Ambulatory Visit: Payer: BC Managed Care – PPO | Admitting: Nurse Practitioner

## 2022-07-09 ENCOUNTER — Ambulatory Visit: Payer: BC Managed Care – PPO | Admitting: Nurse Practitioner

## 2022-07-09 NOTE — Progress Notes (Deleted)
CC:  headaches  History provided from self  Follow-up visit:  Prior visit: 04/09/2022 with Dr. Billey Gosling (initial consult visit)   Brief HPI:   Danielle Calderon is a 62 y.o. female who is being followed in office for headaches which began in 2000, around 01/2022 started to see circles in her left eye and left-sided facial numbness and felt likely etiology visual and sensory migraine aura.   At prior visit, increase gabapentin dosage to 300 mg TID for continued daily headaches.  Completed brain MRI which was unremarkable.  Interval history:   Medical co-morbidities: HLD, HTN, DM, neuropathy  The patient presents for evaluation of headaches which began in 2000. In the past 2 months she has started to see circles in her left eye. She has also developed numbness on the left side of her face. She is not sure if these are always present because she is not always paying attention to these symptoms. States she can typically notice them if she sits and focuses. She also reports a mild headache every day (1-2/10 pain). Occasionally will have more severe 7-8/10 headaches as well.  Takes gabapentin 300 mg BID for headaches and neuropathy. This does help with the neuropathy but she continues to have lower level headaches daily. It does not cause side effects.  Headache History: Onset: 2000 Triggers: none Aura: circles in vision, left face numbness Location: frontal Quality/Description: pressure Associated Symptoms:  Photophobia: no  Phonophobia: no  Nausea: yes Other symptoms: dizziness Worse with activity?: no Duration of headaches: 24 hours  Headache days per month: 30 Migraine days per month: 1 Headache free days per month: 0  Current Treatment: Abortive none  Preventative Gabapentin 300 mg BID  Prior Therapies                                 Lisinopril - cough Telmisartan 40 mg daily Gabapentin 300 mg BID Citalopram 40 mg daily   LABS: CBC    Component Value Date/Time    WBC 4.8 05/21/2022 0614   RBC 4.41 05/21/2022 0614   HGB 11.4 (L) 05/21/2022 0614   HGB 11.1 03/05/2022 1238   HCT 35.9 (L) 05/21/2022 0614   HCT 38.0 03/05/2022 1238   PLT 226 05/21/2022 0614   PLT 230 03/05/2022 1238   MCV 81.4 05/21/2022 0614   MCV 82 03/05/2022 1238   MCH 25.9 (L) 05/21/2022 0614   MCHC 31.8 05/21/2022 0614   RDW 14.0 05/21/2022 0614   RDW 14.4 03/05/2022 1238   LYMPHSABS 2.1 01/19/2019 1121   MONOABS 0.5 10/07/2007 2127   EOSABS 0.1 01/19/2019 1121   BASOSABS 0.0 01/19/2019 1121      Latest Ref Rng & Units 05/21/2022    6:14 AM 03/05/2022   12:38 PM 08/20/2021   11:34 AM  CMP  Glucose 70 - 99 mg/dL 102  89  80   BUN 8 - 23 mg/dL 8  13  11   $ Creatinine 0.44 - 1.00 mg/dL 0.77  0.81  0.76   Sodium 135 - 145 mmol/L 140  143  140   Potassium 3.5 - 5.1 mmol/L 3.5  4.1  3.6   Chloride 98 - 111 mmol/L 101  105  99   CO2 22 - 32 mmol/L 28  25  28   $ Calcium 8.9 - 10.3 mg/dL 9.3  8.8  9.7   Total Protein 6.0 - 8.5 g/dL  7.2  Total Bilirubin 0.0 - 1.2 mg/dL  0.3    Alkaline Phos 44 - 121 IU/L  55    AST 0 - 40 IU/L  37    ALT 0 - 32 IU/L  38       IMAGING:  MRI brain, C-spine 2011: unremarkable brain, no cord compression or foraminal stenosis  Current Outpatient Medications on File Prior to Visit  Medication Sig Dispense Refill   alendronate (FOSAMAX) 70 MG tablet TAKE 1 TABLET ONCE A WEEK WITH A FULL GLASS OF WATER ON EMPTY STOMACH. 4 tablet 0   calcium carbonate (OS-CAL) 600 MG TABS tablet Take 1,200 mg by mouth at bedtime.     Cholecalciferol (VITAMIN D) 50 MCG (2000 UT) CAPS Take 2,000 Units by mouth at bedtime.     citalopram (CELEXA) 40 MG tablet Take 1 tablet (40 mg total) by mouth at bedtime. 90 tablet 0   diphenhydrAMINE (SOMINEX) 25 MG tablet Take 25 mg by mouth at bedtime as needed for allergies or sleep.     gabapentin (NEURONTIN) 300 MG capsule Take 1 capsule (300 mg total) by mouth 3 (three) times daily. 90 capsule 5   LORazepam (ATIVAN)  0.5 MG tablet Take 1-2 pills 30 minutes prior to MRI 2 tablet 0   metFORMIN (GLUCOPHAGE) 500 MG tablet Take 1 tablet (500 mg total) by mouth 2 (two) times daily with a meal for 180 doses. 180 tablet 0   omeprazole (PRILOSEC) 20 MG capsule Take 20 mg by mouth daily.     rosuvastatin (CRESTOR) 5 MG tablet TAKE 1 TABLET BY MOUTH ONCE DAILY AT BEDTIME 90 tablet 0   Semaglutide, 1 MG/DOSE, 4 MG/3ML SOPN Inject 1 mg into the skin once a week. (Patient taking differently: Inject 1 mg into the skin every Tuesday.) 9 mL 1   telmisartan (MICARDIS) 40 MG tablet Take 1 tablet (40 mg total) by mouth daily. 90 tablet 0   No current facility-administered medications on file prior to visit.     Allergies: Allergies  Allergen Reactions   Lisinopril Cough    Family History: Migraine or other headaches in the family:  no Aneurysms in a first degree relative:  no Brain tumors in the family:  no Other neurological illness in the family:   no  Past Medical History: Past Medical History:  Diagnosis Date   Anxiety    Depression    Diabetes mellitus without complication ([place])    Type II   GERD (gastroesophageal reflux disease)    Headache    mild daily takes gabapentin   History of migraine headaches    Hyperlipidemia    Hypertension     Past Surgical History Past Surgical History:  Procedure Laterality Date   COLONOSCOPY W/ POLYPECTOMY     DILATATION & CURETTAGE/HYSTEROSCOPY WITH MYOSURE N/A 03/14/2020   Procedure: DILATATION & CURETTAGE/HYSTEROSCOPY WITH MYOSURE;  Surgeon: Sloan Leiter, MD;  Location: [facility];  Service: Gynecology;  Laterality: N/A;   RADIOLOGY WITH ANESTHESIA N/A 05/21/2022   Procedure: MRI BRAIN WITH AND WITHOUT CONTRAST WITH ANESTHESIA;  Surgeon: Radiologist, Medication, MD;  Location: [place];  Service: Radiology;  Laterality: N/A;   ROBOTIC ASSISTED TOTAL HYSTERECTOMY WITH BILATERAL SALPINGO OOPHERECTOMY N/A 03/27/2020   Procedure: XI ROBOTIC ASSISTED TOTAL HYSTERECTOMY WITH  BILATERAL SALPINGO OOPHORECTOMY;  Surgeon: Lafonda Mosses, MD;  Location: [facility];  Service: Gynecology;  Laterality: N/A;    Social History: Social History   Tobacco Use   Smoking status: Former  Years: 17.00    Types: Cigarettes    Quit date: 2004    Years since quitting: 20.0   Smokeless tobacco: Never  Vaping Use   Vaping Use: Never used  Substance Use Topics   Alcohol use: Yes    Alcohol/week: 2.0 standard drinks of alcohol    Types: 2 Glasses of wine per week   Drug use: No    ROS: Negative for fevers, chills. Positive for headaches, vision changes. All other systems reviewed and negative unless stated otherwise in HPI.   Physical Exam:   Vital Signs: There were no vitals taken for this visit. GENERAL: well appearing,in no acute distress,alert SKIN:  Color, texture, turgor normal. No rashes or lesions HEAD:  Normocephalic/atraumatic. CV:  RRR RESP: Normal respiratory effort MSK: no tenderness to palpation over occiput, neck, or shoulders  NEUROLOGICAL: Mental Status: Alert, oriented to person, place and time,Follows commands Cranial Nerves: PERRL, visual fields intact to confrontation, extraocular movements intact, facial sensation intact, no facial droop or ptosis, hearing grossly intact, no dysarthria Motor: muscle strength 5/5 both upper and lower extremities,no drift, normal tone Reflexes: 2+ throughout Sensation: intact to light touch all 4 extremities Coordination: Finger-to- nose-finger intact bilaterally Gait: normal-based    IMPRESSION: 62 year old female with a history of HLD, HTN, DM, neuropathy who presented for evaluation of headaches with persistent vision changes and left face numbness, evaluated by Dr. Billey Gosling 04/09/2022.  MRI brain unremarkable.  Will order brain MRI to assess for structural causes of new neurologic deficits. If imaging is normal, symptoms may represent visual and sensory migraine aura. She continues to  have mild headaches with gabapentin, will increase dosing to TID which may also help with aura.   PLAN: -Increase gabapentin to 300 mg TID, uptitrate further as needed -Next steps: consider PRN triptan if MRI normal       I spent *** minutes of face-to-face and non-face-to-face time with patient.  This included previsit chart review, lab review, study review, order entry, electronic health record documentation, patient education  Danielle Calderon, [facility]  [facility] [address] [place], [place] [address]  Phone [phone] Fax [phone] Note: This document was prepared with digital dictation and possible smart phrase technology. Any transcriptional errors that result from this process are unintentional.

## 2022-07-10 ENCOUNTER — Telehealth: Payer: Self-pay | Admitting: Psychiatry

## 2022-07-10 ENCOUNTER — Ambulatory Visit: Payer: BC Managed Care – PPO | Admitting: Adult Health

## 2022-07-10 NOTE — Telephone Encounter (Signed)
Pt cancelled appt due to scheduling conflict. Transferred to Billing.

## 2022-07-21 ENCOUNTER — Other Ambulatory Visit: Payer: Self-pay | Admitting: Nurse Practitioner

## 2022-07-25 ENCOUNTER — Other Ambulatory Visit: Payer: Self-pay | Admitting: Nurse Practitioner

## 2022-07-25 DIAGNOSIS — F3342 Major depressive disorder, recurrent, in full remission: Secondary | ICD-10-CM

## 2022-07-25 DIAGNOSIS — M8580 Other specified disorders of bone density and structure, unspecified site: Secondary | ICD-10-CM

## 2022-07-29 DIAGNOSIS — Z1231 Encounter for screening mammogram for malignant neoplasm of breast: Secondary | ICD-10-CM | POA: Diagnosis not present

## 2022-07-31 ENCOUNTER — Ambulatory Visit: Payer: BC Managed Care – PPO | Admitting: Nurse Practitioner

## 2022-08-12 ENCOUNTER — Ambulatory Visit: Payer: BC Managed Care – PPO | Admitting: Nurse Practitioner

## 2022-08-12 ENCOUNTER — Encounter: Payer: Self-pay | Admitting: Nurse Practitioner

## 2022-08-12 DIAGNOSIS — E1169 Type 2 diabetes mellitus with other specified complication: Secondary | ICD-10-CM

## 2022-08-12 DIAGNOSIS — Z780 Asymptomatic menopausal state: Secondary | ICD-10-CM

## 2022-08-12 DIAGNOSIS — M8580 Other specified disorders of bone density and structure, unspecified site: Secondary | ICD-10-CM

## 2022-08-12 DIAGNOSIS — E669 Obesity, unspecified: Secondary | ICD-10-CM | POA: Diagnosis not present

## 2022-08-12 DIAGNOSIS — G629 Polyneuropathy, unspecified: Secondary | ICD-10-CM

## 2022-08-12 MED ORDER — ALENDRONATE SODIUM 70 MG PO TABS: ORAL_TABLET | ORAL | 2 refills | Status: DC

## 2022-08-12 MED ORDER — SEMAGLUTIDE (1 MG/DOSE) 4 MG/3ML ~~LOC~~ SOPN: 1.0000 mg | PEN_INJECTOR | SUBCUTANEOUS | 2 refills | Status: DC

## 2022-08-12 MED ORDER — GABAPENTIN 300 MG PO CAPS: 300.0000 mg | ORAL_CAPSULE | Freq: Three times a day (TID) | ORAL | 5 refills | Status: DC

## 2022-08-12 NOTE — Patient Instructions (Signed)

## 2022-08-12 NOTE — Progress Notes (Signed)
I,Sheena H Holbrook,acting as a Education administrator for [name], FNP.,have documented all relevant documentation on the behalf of [name], FNP,as directed by  [name], FNP while in the presence of [name], [place].    Subjective:     Patient ID: Danielle Calderon , female    DOB: 1960-11-12 , 62 y.o.   MRN: [id number]   Chief Complaint  Patient presents with   Diabetes   Hypertension    HPI  Patient presents today for follow up on htn and DM. Declines any constipation. Her appt for bone density is Wednesday at 3pm   Diabetes She presents for her follow-up diabetic visit. She has type 2 diabetes mellitus. There are no hypoglycemic associated symptoms. Pertinent negatives for hypoglycemia include no confusion, dizziness, headaches or nervousness/anxiousness. There are no diabetic associated symptoms. Pertinent negatives for diabetes include no chest pain, no polydipsia, no polyphagia and no polyuria. There are no hypoglycemic complications. There are no diabetic complications. Risk factors for coronary artery disease include obesity, diabetes mellitus and sedentary lifestyle. Current diabetic treatment includes oral agent (monotherapy). There is no change in her home blood glucose trend.  Hypertension Pertinent negatives include no chest pain, headaches or palpitations.     Past Medical History:  Diagnosis Date   Anxiety    Depression    Diabetes mellitus without complication (HCC)    Type II   GERD (gastroesophageal reflux disease)    Headache    mild daily takes gabapentin   History of migraine headaches    Hyperlipidemia    Hypertension      Family History  Problem Relation Age of Onset   Hypertension Mother    Hypertension Father    Cancer Father    Diabetes Father    Breast cancer Maternal Aunt    AAA (abdominal aortic aneurysm) Maternal Aunt    Cancer Maternal Great-grandmother        cervical vs. uterine (sounds like cervical, treated with cryotherapy)   Breast  cancer Cousin        maternal     Current Outpatient Medications:    alendronate (FOSAMAX) 70 MG tablet, TAKE 1 TABLET ONCE A WEEK WITH A FULL GLASS OF WATER ON EMPTY STOMACH., Disp: 4 tablet, Rfl: 0   calcium carbonate (OS-CAL) 600 MG TABS tablet, Take 1,200 mg by mouth at bedtime., Disp: , Rfl:    Cholecalciferol (VITAMIN D) 50 MCG (2000 UT) CAPS, Take 2,000 Units by mouth at bedtime., Disp: , Rfl:    citalopram (CELEXA) 40 MG tablet, TAKE 1 TABLET BY MOUTH AT BEDTIME, Disp: 90 tablet, Rfl: 0   diphenhydrAMINE (SOMINEX) 25 MG tablet, Take 25 mg by mouth at bedtime as needed for allergies or sleep., Disp: , Rfl:    gabapentin (NEURONTIN) 300 MG capsule, Take 1 capsule (300 mg total) by mouth 3 (three) times daily., Disp: 90 capsule, Rfl: 5   LORazepam (ATIVAN) 0.5 MG tablet, Take 1-2 pills 30 minutes prior to MRI, Disp: 2 tablet, Rfl: 0   omeprazole (PRILOSEC) 20 MG capsule, Take 20 mg by mouth daily., Disp: , Rfl:    rosuvastatin (CRESTOR) 5 MG tablet, TAKE 1 TABLET BY MOUTH ONCE DAILY AT BEDTIME, Disp: 90 tablet, Rfl: 0   Semaglutide, 1 MG/DOSE, 4 MG/3ML SOPN, Inject 1 mg into the skin once a week. (Patient taking differently: Inject 1 mg into the skin every Tuesday.), Disp: 9 mL, Rfl: 1   telmisartan (MICARDIS) 40 MG tablet, Take 1 tablet by mouth once daily,  Disp: 90 tablet, Rfl: 0   metFORMIN (GLUCOPHAGE) 500 MG tablet, Take 1 tablet (500 mg total) by mouth 2 (two) times daily with a meal for 180 doses., Disp: 180 tablet, Rfl: 0   Allergies  Allergen Reactions   Lisinopril Cough     Review of Systems  Constitutional: Negative.   Respiratory: Negative.    Cardiovascular:  Negative for chest pain, palpitations and leg swelling.  Endocrine: Negative for polydipsia, polyphagia and polyuria.  Neurological:  Negative for dizziness and headaches.  Psychiatric/Behavioral:  Negative for confusion. The patient is not nervous/anxious.   All other systems reviewed and are negative.     Today's Vitals   08/12/22 1135  BP: 122/78  Pulse: 64  Temp: 98.4 F (36.9 C)  TempSrc: Oral  SpO2: 97%  Weight: 173 lb (78.5 kg)  Height: '5\' 5"'$  (1.651 m)   Body mass index is 28.79 kg/m.   Objective:  Physical Exam Vitals reviewed.  Constitutional:      General: She is not in acute distress.    Appearance: Normal appearance.  HENT:     Head: Normocephalic.  Eyes:     Pupils: Pupils are equal, round, and reactive to light.  Cardiovascular:     Rate and Rhythm: Normal rate and regular rhythm.     Pulses: Normal pulses.     Heart sounds: Normal heart sounds. No murmur heard. Pulmonary:     Effort: Pulmonary effort is normal. No respiratory distress.     Breath sounds: Normal breath sounds. No wheezing.  Skin:    General: Skin is warm and dry.     Capillary Refill: Capillary refill takes less than 2 seconds.  Neurological:     General: No focal deficit present.     Mental Status: She is alert and oriented to person, place, and time.     Cranial Nerves: No cranial nerve deficit.     Motor: No weakness.  Psychiatric:        Mood and Affect: Mood normal.        Behavior: Behavior normal.        Thought Content: Thought content normal.        Judgment: Judgment normal.         Assessment And Plan:     1. Osteopenia after menopause Comments: Vitamin d supplement and calcium supplement regularly, will repeat bone density in 2 years  2. Neuropathy  3. Diabetes mellitus type 2 in obese [facility]) Comments: HgbA1c slightly elevated at last visit, continue Ozempic, Diabetes foot exam done, no abnormal findings     Patient was given opportunity to ask questions. Patient verbalized understanding of the plan and was able to repeat key elements of the plan. All questions were answered to their satisfaction.  [name], FNP   I, [name], FNP, have reviewed all documentation for this visit. The documentation on 08/12/22 for the exam, diagnosis, procedures, and  orders are all accurate and complete.   IF YOU HAVE BEEN REFERRED TO A SPECIALIST, IT MAY TAKE 1-2 WEEKS TO SCHEDULE/PROCESS THE REFERRAL. IF YOU HAVE NOT HEARD FROM US/SPECIALIST IN TWO WEEKS, PLEASE GIVE [place] A CALL AT [phone].   THE PATIENT IS ENCOURAGED TO PRACTICE SOCIAL DISTANCING DUE TO THE COVID-19 PANDEMIC.

## 2022-08-13 LAB — BMP8+EGFR
BUN/Creatinine Ratio: 13 (ref 12–28)
BUN: 10 mg/dL (ref 8–27)
CO2: 24 mmol/L (ref 20–29)
Calcium: 8.2 mg/dL — ABNORMAL LOW (ref 8.7–10.3)
Chloride: 101 mmol/L (ref 96–106)
Creatinine, Ser: 0.77 mg/dL (ref 0.57–1.00)
Glucose: 80 mg/dL (ref 70–99)
Potassium: 4 mmol/L (ref 3.5–5.2)
Sodium: 140 mmol/L (ref 134–144)
eGFR: 87 mL/min/{1.73_m2} (ref >59)

## 2022-08-13 LAB — LIPID PANEL
Chol/HDL Ratio: 2.3 ratio (ref 0.0–4.4)
Cholesterol, Total: 152 mg/dL (ref 100–199)
HDL: 66 mg/dL (ref >39)
LDL Chol Calc (NIH): 71 mg/dL (ref 0–99)
Triglycerides: 80 mg/dL (ref 0–149)
VLDL Cholesterol Cal: 15 mg/dL (ref 5–40)

## 2022-08-13 LAB — HEMOGLOBIN A1C
Est. average glucose Bld gHb Est-mCnc: 123 mg/dL
Hgb A1c MFr Bld: 5.9 % — ABNORMAL HIGH (ref 4.8–5.6)

## 2022-08-14 DIAGNOSIS — M8589 Other specified disorders of bone density and structure, multiple sites: Secondary | ICD-10-CM | POA: Diagnosis not present

## 2022-08-14 DIAGNOSIS — Z780 Asymptomatic menopausal state: Secondary | ICD-10-CM | POA: Diagnosis not present

## 2022-08-14 LAB — HM DEXA SCAN

## 2022-08-14 LAB — HM MAMMOGRAPHY

## 2022-08-16 ENCOUNTER — Other Ambulatory Visit: Payer: Self-pay | Admitting: Nurse Practitioner

## 2022-08-29 ENCOUNTER — Ambulatory Visit (HOSPITAL_COMMUNITY)
Admission: EM | Admit: 2022-08-29 | Discharge: 2022-08-29 | Disposition: A | Discharge status: Home / Self Care | Payer: BC Managed Care – PPO | Attending: Emergency Medicine | Admitting: Emergency Medicine

## 2022-08-29 ENCOUNTER — Encounter (HOSPITAL_COMMUNITY): Payer: Self-pay

## 2022-08-29 DIAGNOSIS — R52 Pain, unspecified: Secondary | ICD-10-CM

## 2022-08-29 DIAGNOSIS — R509 Fever, unspecified: Secondary | ICD-10-CM

## 2022-08-29 DIAGNOSIS — Z1152 Encounter for screening for COVID-19: Secondary | ICD-10-CM | POA: Diagnosis not present

## 2022-08-29 DIAGNOSIS — J069 Acute upper respiratory infection, unspecified: Secondary | ICD-10-CM | POA: Diagnosis not present

## 2022-08-29 MED ORDER — PROMETHAZINE-DM 6.25-15 MG/5ML PO SYRP: 5.0000 mL | ORAL_SOLUTION | Freq: Every evening | ORAL | 0 refills | Status: DC | PRN

## 2022-08-29 MED ORDER — OSELTAMIVIR PHOSPHATE 75 MG PO CAPS: 75.0000 mg | ORAL_CAPSULE | Freq: Two times a day (BID) | ORAL | 0 refills | Status: DC

## 2022-08-29 MED ORDER — BENZONATATE 100 MG PO CAPS: 100.0000 mg | ORAL_CAPSULE | Freq: Three times a day (TID) | ORAL | 0 refills | Status: DC

## 2022-08-29 MED ORDER — FLUTICASONE PROPIONATE 50 MCG/ACT NA SUSP: 1.0000 | Freq: Every day | NASAL | 0 refills | Status: AC

## 2022-08-29 NOTE — ED Triage Notes (Signed)
Pt presents with productive cough x 2 days and runny nose. Pt had fever today of 102. Negative home test for covid.

## 2022-08-29 NOTE — ED Provider Notes (Signed)
[place]    CSN: [id number] Arrival date & time: 08/29/22  1751      History   Chief Complaint Chief Complaint  Patient presents with   Cough    HPI Danielle Calderon is a 62 y.o. female.   Patient presents today with a 2-day history of URI symptoms including cough, rhinorrhea, fever.  She reports Tmax of 102 F.  She took an at-home COVID test that was negative.  She has not tried any over-the-counter medications for symptom management.  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  She has not had COVID in the past.  She has had COVID vaccines but not had the most recent booster.  She has had influenza vaccine.  She denies any history of allergies, asthma, COPD.  She does not smoke.  Denies any recent antibiotics or steroids.  She does have a history of diabetes but reports her blood sugars are adequately controlled.    Past Medical History:  Diagnosis Date   Anxiety    Depression    Diabetes mellitus without complication ([place])    Type II   GERD (gastroesophageal reflux disease)    Headache    mild daily takes gabapentin   History of migraine headaches    Hyperlipidemia    Hypertension     Patient Active Problem List   Diagnosis Date Noted   Endometrial hyperplasia 03/21/2020   Obesity (BMI 30-39.9) 03/21/2020   Laryngopharyngeal reflux (LPR) 12/16/2017    Past Surgical History:  Procedure Laterality Date   COLONOSCOPY W/ POLYPECTOMY     DILATATION & CURETTAGE/HYSTEROSCOPY WITH MYOSURE N/A 03/14/2020   Procedure: DILATATION & CURETTAGE/HYSTEROSCOPY WITH MYOSURE;  Surgeon: Sloan Leiter, MD;  Location: [place];  Service: Gynecology;  Laterality: N/A;   RADIOLOGY WITH ANESTHESIA N/A 05/21/2022   Procedure: MRI BRAIN WITH AND WITHOUT CONTRAST WITH ANESTHESIA;  Surgeon: Radiologist, Medication, MD;  Location: [place];  Service: Radiology;  Laterality: N/A;   ROBOTIC ASSISTED TOTAL HYSTERECTOMY WITH  BILATERAL SALPINGO OOPHERECTOMY N/A 03/27/2020   Procedure: XI ROBOTIC ASSISTED TOTAL HYSTERECTOMY WITH BILATERAL SALPINGO OOPHORECTOMY;  Surgeon: Lafonda Mosses, MD;  Location: [facility];  Service: Gynecology;  Laterality: N/A;    OB History     Gravida  3   Para  1   Term  1   Preterm      AB  2   Living  1      SAB      IAB      Ectopic      Multiple      Live Births  1            Home Medications    Prior to Admission medications   Medication Sig Start Date End Date Taking? Authorizing Provider  benzonatate (TESSALON) 100 MG capsule Take 1 capsule (100 mg total) by mouth every 8 (eight) hours. 08/29/22  Yes Annmarie Plemmons K, PA-C  fluticasone (FLONASE) 50 MCG/ACT nasal spray Place 1 spray into both nostrils daily. 08/29/22  Yes Edell Mesenbrink, Derry Skill, PA-C  oseltamivir (TAMIFLU) 75 MG capsule Take 1 capsule (75 mg total) by mouth every 12 (twelve) hours. 08/29/22  Yes Geniyah Eischeid K, PA-C  promethazine-dextromethorphan (PROMETHAZINE-DM) 6.25-15 MG/5ML syrup Take 5 mLs by mouth at bedtime as needed for cough. 08/29/22  Yes Hedda Crumbley K, PA-C  alendronate (FOSAMAX) 70 MG tablet Take with a full glass of water  on an empty stomach. 08/12/22   [name], FNP  calcium carbonate (OS-CAL) 600 MG TABS tablet Take 1,200 mg by mouth at bedtime.    [provider]  Cholecalciferol (VITAMIN D) 50 MCG (2000 UT) CAPS Take 2,000 Units by mouth at bedtime.    [provider]  citalopram (CELEXA) 40 MG tablet TAKE 1 TABLET BY MOUTH AT BEDTIME 07/26/22   [name], FNP  diphenhydrAMINE (SOMINEX) 25 MG tablet Take 25 mg by mouth at bedtime as needed for allergies or sleep.    [provider]  gabapentin (NEURONTIN) 300 MG capsule Take 1 capsule (300 mg total) by mouth 3 (three) times daily. 08/12/22   [name], FNP  LORazepam (ATIVAN) 0.5 MG tablet Take 1-2 pills 30 minutes prior to MRI 04/09/22   Genia Harold, MD  metFORMIN  (GLUCOPHAGE) 500 MG tablet TAKE 1 TABLET BY MOUTH TWICE DAILY WITH A MEAL 08/16/22   [name], FNP  omeprazole (PRILOSEC) 20 MG capsule Take 20 mg by mouth daily.    [provider]  rosuvastatin (CRESTOR) 5 MG tablet TAKE 1 TABLET BY MOUTH ONCE DAILY AT BEDTIME 07/02/22   [name], FNP  Semaglutide, 1 MG/DOSE, 4 MG/3ML SOPN Inject 1 mg into the skin once a week. 08/12/22   [name], FNP  telmisartan (MICARDIS) 40 MG tablet Take 1 tablet by mouth once daily 07/21/22   [name], FNP    Family History Family History  Problem Relation Age of Onset   Hypertension Mother    Hypertension Father    Cancer Father    Diabetes Father    Breast cancer Maternal Aunt    AAA (abdominal aortic aneurysm) Maternal Aunt    Cancer Maternal Great-grandmother        cervical vs. uterine (sounds like cervical, treated with cryotherapy)   Breast cancer Cousin        maternal    Social History Social History   Tobacco Use   Smoking status: Former    Years: 17.00    Types: Cigarettes    Quit date: 2004    Years since quitting: 20.1   Smokeless tobacco: Never  Vaping Use   Vaping Use: Never used  Substance Use Topics   Alcohol use: Yes    Alcohol/week: 2.0 standard drinks of alcohol    Types: 2 Glasses of wine per week   Drug use: No     Allergies   Lisinopril   Review of Systems Review of Systems  Constitutional:  Positive for activity change, fatigue and fever. Negative for appetite change.  HENT:  Positive for congestion and sore throat. Negative for sinus pressure and sneezing.   Respiratory:  Positive for cough. Negative for shortness of breath.   Cardiovascular:  Negative for chest pain.  Gastrointestinal:  Positive for nausea and vomiting. Negative for abdominal pain and diarrhea.  Musculoskeletal:  Positive for arthralgias and myalgias.  Neurological:  Positive for headaches. Negative for dizziness and light-headedness.     Physical Exam Triage Vital  Signs ED Triage Vitals  Enc Vitals Group     BP 08/29/22 1922 (!) 147/68     Pulse Rate 08/29/22 1922 89     Resp 08/29/22 1922 18     Temp 08/29/22 1922 98.7 F (37.1 C)     Temp src --      SpO2 08/29/22 1922 94 %     Weight --      Height --  Head Circumference --      Peak Flow --      Pain Score 08/29/22 1920 0     Pain Loc --      Pain Edu? --      Excl. in [place]? --    No data found.  Updated Vital Signs BP (!) 147/68   Pulse 89   Temp 98.7 F (37.1 C)   Resp 18   SpO2 94%   Visual Acuity Right Eye Distance:   Left Eye Distance:   Bilateral Distance:    Right Eye Near:   Left Eye Near:    Bilateral Near:     Physical Exam Vitals reviewed.  Constitutional:      General: She is awake. She is not in acute distress.    Appearance: Normal appearance. She is well-developed. She is not ill-appearing.     Comments: Very pleasant female appears stated age in no acute distress sitting comfortably in exam room  HENT:     Head: Normocephalic and atraumatic.     Right Ear: Tympanic membrane, ear canal and external ear normal. Tympanic membrane is not erythematous or bulging.     Left Ear: Tympanic membrane, ear canal and external ear normal. Tympanic membrane is not erythematous or bulging.     Nose:     Right Sinus: No maxillary sinus tenderness or frontal sinus tenderness.     Left Sinus: No maxillary sinus tenderness or frontal sinus tenderness.     Mouth/Throat:     Pharynx: Uvula midline. Posterior oropharyngeal erythema present. No oropharyngeal exudate.  Cardiovascular:     Rate and Rhythm: Normal rate and regular rhythm.     Heart sounds: Normal heart sounds, S1 normal and S2 normal. No murmur heard. Pulmonary:     Effort: Pulmonary effort is normal.     Breath sounds: Normal breath sounds. No wheezing, rhonchi or rales.     Comments: Clear to auscultation bilaterally Psychiatric:        Behavior: Behavior is cooperative.      UC Treatments /  Results  Labs (all labs ordered are listed, but only abnormal results are displayed) Labs Reviewed  SARS CORONAVIRUS 2 (TAT 6-24 HRS)    EKG   Radiology No results found.  Procedures Procedures (including critical care time)  Medications Ordered in UC Medications - No data to display  Initial Impression / Assessment and Plan / UC Course  I have reviewed the triage vital signs and the nursing notes.  Pertinent labs & imaging results that were available during my care of the patient were reviewed by me and considered in my medical decision making (see chart for details).     Patient is well-appearing, afebrile, nontoxic, nontachycardic.  Unfortunately, we do not have influenza testing in clinic today.  Given her high fever and bodyaches will empirically treat for influenza since she is within the window of effectiveness for Tamiflu.  Discussed that this can cause GI side effects and if she has any worsening nausea, vomiting, diarrhea she should be seen immediately.  COVID testing was obtained and if she is positive we will discontinue Tamiflu.  Because of her history of diabetes and age she is a candidate for antiviral therapy.  She had a metabolic panel obtained [id number] with creatinine of 0.77 and EGFR of 87.  If she is positive for COVID and considering Paxlovid she would need to hold her rosuvastatin while on the medication for 3 days after completing course.  She  also has lorazepam listed on her chart but this appears to be for premedication prior to imaging; according to [place] she is only able refill of this medication of a total of 2 tablets October 2023.  This should not be taken with Paxlovid.  Will treat symptomatically with Flonase for congestion and Promethazine DM for cough.  She was given Tessalon to help manage diurnal cough.  Recommended rest and drinking plenty of fluid.  Discussed that if her symptoms are improving within a week she should return for reevaluation.  If she  has any worsening symptoms including chest pain, shortness of breath, fever not responding to medication, nausea/vomiting interfering with oral intake, weakness she needs to be seen immediately.  Strict return precautions given.  Work excuse note with current CDC return to work guidelines based on COVID test result provided.  Final Clinical Impressions(s) / UC Diagnoses   Final diagnoses:  Viral URI with cough  Body aches  Fever, unspecified     Discharge Instructions      Start Tamiflu for suspected influenza.  If you are positive for COVID stop this medication.  If you are positive for COVID I do recommend Paxlovid; if you take this medication you would need to hold your rosuvastatin while on this medication for 3 days after.  You also should not take lorazepam with this medication.  Take Promethazine DM for cough.  This make you sleepy so do not drive or drink alcohol while taking it.  It should be taken only at night.  Use Tessalon for cough during the day.  Use Flonase to help with your congestion.  Use over-the-counter medications as needed.  If your symptoms or not improving within a week please return for reevaluation.  If you have any worsening symptoms including chest pain, shortness of breath, fever, nausea/vomiting you need to be seen immediately.     ED Prescriptions     Medication Sig Dispense Auth. Provider   benzonatate (TESSALON) 100 MG capsule Take 1 capsule (100 mg total) by mouth every 8 (eight) hours. 21 capsule Davionne Mastrangelo K, PA-C   fluticasone (FLONASE) 50 MCG/ACT nasal spray Place 1 spray into both nostrils daily. 16 g Keatyn Jawad K, PA-C   promethazine-dextromethorphan (PROMETHAZINE-DM) 6.25-15 MG/5ML syrup Take 5 mLs by mouth at bedtime as needed for cough. 118 mL Alphonso Gregson K, PA-C   oseltamivir (TAMIFLU) 75 MG capsule Take 1 capsule (75 mg total) by mouth every 12 (twelve) hours. 10 capsule Jenaveve Fenstermaker K, PA-C      I have reviewed the PDMP during this  encounter.   Terrilee Croak, PA-C 08/29/22 1940

## 2022-08-29 NOTE — Discharge Instructions (Signed)
Start Tamiflu for suspected influenza.  If you are positive for COVID stop this medication.  If you are positive for COVID I do recommend Paxlovid; if you take this medication you would need to hold your rosuvastatin while on this medication for 3 days after.  You also should not take lorazepam with this medication.  Take Promethazine DM for cough.  This make you sleepy so do not drive or drink alcohol while taking it.  It should be taken only at night.  Use Tessalon for cough during the day.  Use Flonase to help with your congestion.  Use over-the-counter medications as needed.  If your symptoms or not improving within a week please return for reevaluation.  If you have any worsening symptoms including chest pain, shortness of breath, fever, nausea/vomiting you need to be seen immediately.

## 2022-08-30 LAB — SARS CORONAVIRUS 2 (TAT 6-24 HRS): SARS Coronavirus 2: NEGATIVE

## 2022-09-14 ENCOUNTER — Ambulatory Visit (HOSPITAL_COMMUNITY)
Admission: EM | Admit: 2022-09-14 | Discharge: 2022-09-14 | Disposition: A | Discharge status: Home / Self Care | Payer: BC Managed Care – PPO | Attending: Internal Medicine | Admitting: Internal Medicine

## 2022-09-14 ENCOUNTER — Encounter (HOSPITAL_COMMUNITY): Payer: Self-pay

## 2022-09-14 ENCOUNTER — Ambulatory Visit (INDEPENDENT_AMBULATORY_CARE_PROVIDER_SITE_OTHER): Payer: BC Managed Care – PPO

## 2022-09-14 DIAGNOSIS — J209 Acute bronchitis, unspecified: Secondary | ICD-10-CM

## 2022-09-14 DIAGNOSIS — R059 Cough, unspecified: Secondary | ICD-10-CM

## 2022-09-14 DIAGNOSIS — R053 Chronic cough: Secondary | ICD-10-CM

## 2022-09-14 MED ORDER — ALBUTEROL SULFATE HFA 108 (90 BASE) MCG/ACT IN AERS: 1.0000 | INHALATION_SPRAY | Freq: Four times a day (QID) | RESPIRATORY_TRACT | 0 refills | Status: AC | PRN

## 2022-09-14 MED ORDER — PREDNISONE 20 MG PO TABS: 40.0000 mg | ORAL_TABLET | Freq: Every day | ORAL | 0 refills | Status: AC

## 2022-09-14 NOTE — ED Provider Notes (Signed)
[facility]    CSN: [id number] Arrival date & time: 09/14/22  1216      History   Chief Complaint Chief Complaint  Patient presents with   Cough    HPI Danielle Calderon is a 62 y.o. female.   Patient presents to urgent care for evaluation of persistent cough for the last approximately 3 weeks. Cough is dry, non-productive, harsh, and worse at nighttime. She was seen on 08/26/22 at urgent care where she was told she likely had influenza. She took tamiflu and COVID testing was negative. No longer febrile, feels other symptoms have resolved but cough remains. Denies orthopnea, leg swelling, shortness of breath, dizziness, weakness, headache, vision changes, nausea, vomiting, diarrhea, abdominal pain, and heart palpitations. Having some left ribcage pain that she states mostly happens when she coughs. No recent trauma/injuries to the ribcage. She is a former cigarette smoker (quit 20 years ago) and denies drug use. No history of chronic respiratory problems. No recent antibiotic or steroid use. Has been using prescribed cough medicines from previous visit without much relief (tesslon perles and robitussin).    Cough   Past Medical History:  Diagnosis Date   Anxiety    Depression    Diabetes mellitus without complication ([place])    Type II   GERD (gastroesophageal reflux disease)    Headache    mild daily takes gabapentin   History of migraine headaches    Hyperlipidemia    Hypertension     Patient Active Problem List   Diagnosis Date Noted   Endometrial hyperplasia 03/21/2020   Obesity (BMI 30-39.9) 03/21/2020   Laryngopharyngeal reflux (LPR) 12/16/2017    Past Surgical History:  Procedure Laterality Date   COLONOSCOPY W/ POLYPECTOMY     DILATATION & CURETTAGE/HYSTEROSCOPY WITH MYOSURE N/A 03/14/2020   Procedure: DILATATION & CURETTAGE/HYSTEROSCOPY WITH MYOSURE;  Surgeon: Sloan Leiter, MD;  Location: [facility];  Service: Gynecology;  Laterality: N/A;   RADIOLOGY WITH  ANESTHESIA N/A 05/21/2022   Procedure: MRI BRAIN WITH AND WITHOUT CONTRAST WITH ANESTHESIA;  Surgeon: Radiologist, Medication, MD;  Location: [facility];  Service: Radiology;  Laterality: N/A;   ROBOTIC ASSISTED TOTAL HYSTERECTOMY WITH BILATERAL SALPINGO OOPHERECTOMY N/A 03/27/2020   Procedure: XI ROBOTIC ASSISTED TOTAL HYSTERECTOMY WITH BILATERAL SALPINGO OOPHORECTOMY;  Surgeon: Lafonda Mosses, MD;  Location: [facility];  Service: Gynecology;  Laterality: N/A;    OB History     Gravida  3   Para  1   Term  1   Preterm      AB  2   Living  1      SAB      IAB      Ectopic      Multiple      Live Births  1            Home Medications    Prior to Admission medications   Medication Sig Start Date End Date Taking? Authorizing Provider  albuterol (VENTOLIN HFA) 108 (90 Base) MCG/ACT inhaler Inhale 1-2 puffs into the lungs every 6 (six) hours as needed for wheezing or shortness of breath. 09/14/22  Yes Talbot Grumbling, FNP  predniSONE (DELTASONE) 20 MG tablet Take 2 tablets (40 mg total) by mouth daily for 5 days. 09/14/22 09/19/22 Yes StanhopeStasia Cavalier, FNP  alendronate (FOSAMAX) 70 MG tablet Take with a full glass of water on an empty stomach. 08/12/22   [name], FNP  benzonatate (TESSALON) 100 MG capsule Take 1 capsule (  100 mg total) by mouth every 8 (eight) hours. 08/29/22   Raspet, Derry Skill, PA-C  calcium carbonate (OS-CAL) 600 MG TABS tablet Take 1,200 mg by mouth at bedtime.    [provider]  Cholecalciferol (VITAMIN D) 50 MCG (2000 UT) CAPS Take 2,000 Units by mouth at bedtime.    [provider]  citalopram (CELEXA) 40 MG tablet TAKE 1 TABLET BY MOUTH AT BEDTIME 07/26/22   [name], FNP  diphenhydrAMINE (SOMINEX) 25 MG tablet Take 25 mg by mouth at bedtime as needed for allergies or sleep.    [provider]  fluticasone (FLONASE) 50 MCG/ACT nasal spray Place 1 spray into both nostrils daily. 08/29/22    Raspet, Derry Skill, PA-C  gabapentin (NEURONTIN) 300 MG capsule Take 1 capsule (300 mg total) by mouth 3 (three) times daily. 08/12/22   [name], FNP  LORazepam (ATIVAN) 0.5 MG tablet Take 1-2 pills 30 minutes prior to MRI 04/09/22   Genia Harold, MD  metFORMIN (GLUCOPHAGE) 500 MG tablet TAKE 1 TABLET BY MOUTH TWICE DAILY WITH A MEAL 08/16/22   [name], FNP  omeprazole (PRILOSEC) 20 MG capsule Take 20 mg by mouth daily.    [provider]  oseltamivir (TAMIFLU) 75 MG capsule Take 1 capsule (75 mg total) by mouth every 12 (twelve) hours. 08/29/22   Raspet, Derry Skill, PA-C  promethazine-dextromethorphan (PROMETHAZINE-DM) 6.25-15 MG/5ML syrup Take 5 mLs by mouth at bedtime as needed for cough. 08/29/22   Raspet, Junie Panning K, PA-C  rosuvastatin (CRESTOR) 5 MG tablet TAKE 1 TABLET BY MOUTH ONCE DAILY AT BEDTIME 07/02/22   [name], FNP  Semaglutide, 1 MG/DOSE, 4 MG/3ML SOPN Inject 1 mg into the skin once a week. 08/12/22   [name], FNP  telmisartan (MICARDIS) 40 MG tablet Take 1 tablet by mouth once daily 07/21/22   [name], FNP    Family History Family History  Problem Relation Age of Onset   Hypertension Mother    Hypertension Father    Cancer Father    Diabetes Father    Breast cancer Maternal Aunt    AAA (abdominal aortic aneurysm) Maternal Aunt    Cancer Maternal Great-grandmother        cervical vs. uterine (sounds like cervical, treated with cryotherapy)   Breast cancer Cousin        maternal    Social History Social History   Tobacco Use   Smoking status: Former    Years: 17    Types: Cigarettes    Quit date: 2004    Years since quitting: 20.2   Smokeless tobacco: Never  Vaping Use   Vaping Use: Never used  Substance Use Topics   Alcohol use: Yes    Alcohol/week: 2.0 standard drinks of alcohol    Types: 2 Glasses of wine per week   Drug use: No     Allergies   Lisinopril   Review of Systems Review of Systems  Respiratory:  Positive for  cough.    Per HPI  Physical Exam Triage Vital Signs ED Triage Vitals  Enc Vitals Group     BP 09/14/22 1239 125/79     Pulse Rate 09/14/22 1239 71     Resp 09/14/22 1239 16     Temp 09/14/22 1239 98 F (36.7 C)     Temp Source 09/14/22 1239 Oral     SpO2 09/14/22 1239 93 %     Weight --      Height --  Head Circumference --      Peak Flow --      Pain Score 09/14/22 1240 0     Pain Loc --      Pain Edu? --      Excl. in [place]? --    No data found.  Updated Vital Signs BP 125/79 (BP Location: Right Arm)   Pulse 71   Temp 98 F (36.7 C) (Oral)   Resp 16   SpO2 93%   Visual Acuity Right Eye Distance:   Left Eye Distance:   Bilateral Distance:    Right Eye Near:   Left Eye Near:    Bilateral Near:     Physical Exam Vitals and nursing note reviewed.  Constitutional:      Appearance: She is not ill-appearing or toxic-appearing.  HENT:     Head: Normocephalic and atraumatic.     Right Ear: Hearing, tympanic membrane, ear canal and external ear normal.     Left Ear: Hearing, tympanic membrane, ear canal and external ear normal.     Nose: Nose normal.     Mouth/Throat:     Lips: Pink.     Mouth: Mucous membranes are moist. No injury.     Tongue: No lesions. Tongue does not deviate from midline.     Palate: No mass and lesions.     Pharynx: Oropharynx is clear. Uvula midline. No pharyngeal swelling, oropharyngeal exudate, posterior oropharyngeal erythema or uvula swelling.     Tonsils: No tonsillar exudate or tonsillar abscesses.  Eyes:     General: Lids are normal. Vision grossly intact. Gaze aligned appropriately.     Extraocular Movements: Extraocular movements intact.     Conjunctiva/sclera: Conjunctivae normal.  Cardiovascular:     Rate and Rhythm: Normal rate and regular rhythm.     Heart sounds: Normal heart sounds, S1 normal and S2 normal.  Pulmonary:     Effort: Pulmonary effort is normal. No respiratory distress.     Breath sounds: Normal breath  sounds and air entry.     Comments: Course breath sounds heard throughout with harsh and wet sounding cough elicited with deep inspiration during lung exam. Faint low pitched wheeze heard to the lower lung fields bilaterally.  Musculoskeletal:     Cervical back: Neck supple.  Lymphadenopathy:     Cervical: No cervical adenopathy.  Skin:    General: Skin is warm and dry.     Capillary Refill: Capillary refill takes less than 2 seconds.     Findings: No rash.  Neurological:     General: No focal deficit present.     Mental Status: She is alert and oriented to person, place, and time. Mental status is at baseline.     Cranial Nerves: No dysarthria or facial asymmetry.  Psychiatric:        Mood and Affect: Mood normal.        Speech: Speech normal.        Behavior: Behavior normal.        Thought Content: Thought content normal.        Judgment: Judgment normal.      UC Treatments / Results  Labs (all labs ordered are listed, but only abnormal results are displayed) Labs Reviewed - No data to display  EKG   Radiology DG Chest 2 View  Result Date: 09/14/2022 CLINICAL DATA:  Persistent cough for greater than 2 weeks. EXAM: CHEST - 2 VIEW COMPARISON:  Chest radiograph dated 10/07/2007. FINDINGS: The heart size and  mediastinal contours are within normal limits. Both lungs are clear. The visualized skeletal structures are unremarkable. IMPRESSION: No active cardiopulmonary disease. Electronically Signed   By: Zerita Boers M.D.   On: 09/14/2022 14:30    Procedures Procedures (including critical care time)  Medications Ordered in UC Medications - No data to display  Initial Impression / Assessment and Plan / UC Course  I have reviewed the triage vital signs and the nursing notes.  Pertinent labs & imaging results that were available during my care of the patient were reviewed by me and considered in my medical decision making (see chart for details).   1. Acute bronchitis,  persistent cough Chest x-ray negative for acute cardiopulmonary disease. Will manage this as acute bronchitis with steroid burst and as needed use of albuterol inhaler as prescribed. She is a type 2 diabetic, last HA1C was 5.9. Sugars are usually very well controlled. Discussed prednisone will likely increase sugars temporarily, advised to watch diet over the next few days and avoid NSAID while taking prednisone. Take prednisone with food to avoid stomach upset. She is nontoxic in appearance with hemodynamically stable vitals. PCP follow-up in 1-2 weeks recommended for further evaluation and reassessment to ensure cough has improved. She is agreeable with this plan.   Discussed physical exam and available lab work findings in clinic with patient.  Counseled patient regarding appropriate use of medications and potential side effects for all medications recommended or prescribed today. Discussed red flag signs and symptoms of worsening condition,when to call the PCP office, return to urgent care, and when to seek higher level of care in the emergency department. Patient verbalizes understanding and agreement with plan. All questions answered. Patient discharged in stable condition.    Final Clinical Impressions(s) / UC Diagnoses   Final diagnoses:  Persistent cough for 3 weeks or longer  Acute bronchitis, unspecified organism     Discharge Instructions      Take prednisone steroid 40 mg once daily for the next 5 days to treat bronchitis and inflammation in your lungs.  Use albuterol inhaler every 4-6 hours as needed for cough, shortness of breath, and wheezing.  Your chest x-ray looks great and does not show any pneumonia.  Please follow-up with your primary care provider for ongoing evaluation and management of your cough.  The prednisone may cause your sugars to increase slightly but these will return back to normal once your prednisone is finished.  Take prednisone with food to avoid  stomach upset.  Do not take any ibuprofen when taking prednisone as this is a similar medication and can cause stomach upset.  If you develop any new or worsening symptoms or do not improve in the next 2 to 3 days, please return.  If your symptoms are severe, please go to the emergency room.  Follow-up with your primary care provider for further evaluation and management of your symptoms as well as ongoing wellness visits.  I hope you feel better!     ED Prescriptions     Medication Sig Dispense Auth. Provider   albuterol (VENTOLIN HFA) 108 (90 Base) MCG/ACT inhaler Inhale 1-2 puffs into the lungs every 6 (six) hours as needed for wheezing or shortness of breath. 8 g Joella Prince M, FNP   predniSONE (DELTASONE) 20 MG tablet Take 2 tablets (40 mg total) by mouth daily for 5 days. 10 tablet Talbot Grumbling, FNP      PDMP not reviewed this encounter.   Talbot Grumbling, [place] 09/14/22  2158  

## 2022-09-14 NOTE — ED Triage Notes (Signed)
Patient c/o a productive cough since 08/29/22 and states she has yellow sputum. Patient reports cough is worse at night.  Patient states she has taken all of the Tessalon Perles prescribed and Robitussin DM and the last dose was at 100 today.

## 2022-09-14 NOTE — Discharge Instructions (Addendum)
Take prednisone steroid 40 mg once daily for the next 5 days to treat bronchitis and inflammation in your lungs.  Use albuterol inhaler every 4-6 hours as needed for cough, shortness of breath, and wheezing.  Your chest x-ray looks great and does not show any pneumonia.  Please follow-up with your primary care provider for ongoing evaluation and management of your cough.  The prednisone may cause your sugars to increase slightly but these will return back to normal once your prednisone is finished.  Take prednisone with food to avoid stomach upset.  Do not take any ibuprofen when taking prednisone as this is a similar medication and can cause stomach upset.  If you develop any new or worsening symptoms or do not improve in the next 2 to 3 days, please return.  If your symptoms are severe, please go to the emergency room.  Follow-up with your primary care provider for further evaluation and management of your symptoms as well as ongoing wellness visits.  I hope you feel better!

## 2022-10-03 ENCOUNTER — Other Ambulatory Visit: Payer: Self-pay | Admitting: Nurse Practitioner

## 2022-10-03 DIAGNOSIS — E782 Mixed hyperlipidemia: Secondary | ICD-10-CM

## 2022-10-26 ENCOUNTER — Other Ambulatory Visit: Payer: Self-pay | Admitting: Nurse Practitioner

## 2022-10-28 ENCOUNTER — Other Ambulatory Visit: Payer: Self-pay

## 2022-10-28 MED ORDER — TELMISARTAN 40 MG PO TABS: 40.0000 mg | ORAL_TABLET | Freq: Every day | ORAL | 0 refills | Status: DC

## 2022-11-07 ENCOUNTER — Other Ambulatory Visit: Payer: Self-pay

## 2022-11-07 DIAGNOSIS — F3342 Major depressive disorder, recurrent, in full remission: Secondary | ICD-10-CM

## 2022-11-07 MED ORDER — CITALOPRAM HYDROBROMIDE 40 MG PO TABS: 40.0000 mg | ORAL_TABLET | Freq: Every day | ORAL | 0 refills | Status: DC

## 2022-11-12 ENCOUNTER — Encounter: Payer: Self-pay | Admitting: Nurse Practitioner

## 2022-11-12 ENCOUNTER — Ambulatory Visit: Payer: BC Managed Care – PPO | Admitting: Nurse Practitioner

## 2022-11-12 VITALS — BP 122/72 | HR 85 | Temp 98.4°F | Ht 65.0 in | Wt 167.4 lb

## 2022-11-12 DIAGNOSIS — E1169 Type 2 diabetes mellitus with other specified complication: Secondary | ICD-10-CM | POA: Insufficient documentation

## 2022-11-12 DIAGNOSIS — E785 Hyperlipidemia, unspecified: Secondary | ICD-10-CM | POA: Diagnosis not present

## 2022-11-12 DIAGNOSIS — E782 Mixed hyperlipidemia: Secondary | ICD-10-CM | POA: Diagnosis not present

## 2022-11-12 DIAGNOSIS — I10 Essential (primary) hypertension: Secondary | ICD-10-CM | POA: Diagnosis not present

## 2022-11-12 DIAGNOSIS — W57XXXA Bitten or stung by nonvenomous insect and other nonvenomous arthropods, initial encounter: Secondary | ICD-10-CM

## 2022-11-12 DIAGNOSIS — Z780 Asymptomatic menopausal state: Secondary | ICD-10-CM

## 2022-11-12 DIAGNOSIS — M8580 Other specified disorders of bone density and structure, unspecified site: Secondary | ICD-10-CM | POA: Diagnosis not present

## 2022-11-12 DIAGNOSIS — E119 Type 2 diabetes mellitus without complications: Secondary | ICD-10-CM | POA: Insufficient documentation

## 2022-11-12 MED ORDER — METFORMIN HCL 500 MG PO TABS: 500.0000 mg | ORAL_TABLET | Freq: Two times a day (BID) | ORAL | 0 refills | Status: DC

## 2022-11-12 MED ORDER — ALENDRONATE SODIUM 70 MG PO TABS: ORAL_TABLET | ORAL | 2 refills | Status: DC

## 2022-11-12 NOTE — Progress Notes (Signed)
Danielle Calderon,acting as a Neurosurgeon for Danielle Felts, FNP.,have documented all relevant documentation on the behalf of Danielle Felts, FNP,as directed by  Danielle Felts, FNP while in the presence of Danielle Felts, FNP.    Subjective:     Patient ID: Danielle Calderon , female    DOB: 06-01-1961 , 62 y.o.   MRN: [id number]   Chief Complaint  Patient presents with   Diabetes   Hypertension    HPI  Patient presents today for DM and BP, patient states compliance with medications.  Patient reports she has been having pain in her ribs, patient also has bumps on her body she reports its from mites that she found in her house.   BP Readings from Last 3 Encounters: 11/12/22 : 122/72 09/14/22 : 125/79 08/29/22 : (!) 147/68       Past Medical History:  Diagnosis Date   Anxiety    Depression    Diabetes mellitus without complication ([facility])    Type II   GERD (gastroesophageal reflux disease)    Headache    mild daily takes gabapentin   History of migraine headaches    Hyperlipidemia    Hypertension    Obesity (BMI 30-39.9) 03/21/2020     Family History  Problem Relation Age of Onset   Hypertension Mother    Hypertension Father    Cancer Father    Diabetes Father    Breast cancer Maternal Aunt    AAA (abdominal aortic aneurysm) Maternal Aunt    Cancer Maternal Great-grandmother        cervical vs. uterine (sounds like cervical, treated with cryotherapy)   Breast cancer Cousin        maternal     Current Outpatient Medications:    albuterol (VENTOLIN HFA) 108 (90 Base) MCG/ACT inhaler, Inhale 1-2 puffs into the lungs every 6 (six) hours as needed for wheezing or shortness of breath., Disp: 8 g, Rfl: 0   alendronate (FOSAMAX) 70 MG tablet, Take with a full glass of water on an empty stomach ONCE A WEEK., Disp: 4 tablet, Rfl: 2   benzonatate (TESSALON) 100 MG capsule, Take 1 capsule (100 mg total) by mouth every 8 (eight) hours., Disp: 21 capsule, Rfl: 0   calcium carbonate  (OS-CAL) 600 MG TABS tablet, Take 1,200 mg by mouth at bedtime., Disp: , Rfl:    Cholecalciferol (VITAMIN D) 50 MCG (2000 UT) CAPS, Take 2,000 Units by mouth at bedtime., Disp: , Rfl:    citalopram (CELEXA) 40 MG tablet, Take 1 tablet (40 mg total) by mouth at bedtime., Disp: 90 tablet, Rfl: 0   diphenhydrAMINE (SOMINEX) 25 MG tablet, Take 25 mg by mouth at bedtime as needed for allergies or sleep., Disp: , Rfl:    fluticasone (FLONASE) 50 MCG/ACT nasal spray, Place 1 spray into both nostrils daily., Disp: 16 g, Rfl: 0   gabapentin (NEURONTIN) 300 MG capsule, Take 1 capsule (300 mg total) by mouth 3 (three) times daily., Disp: 90 capsule, Rfl: 5   LORazepam (ATIVAN) 0.5 MG tablet, Take 1-2 pills 30 minutes prior to MRI, Disp: 2 tablet, Rfl: 0   metFORMIN (GLUCOPHAGE) 500 MG tablet, Take 1 tablet (500 mg total) by mouth 2 (two) times daily with a meal., Disp: 180 tablet, Rfl: 0   omeprazole (PRILOSEC) 20 MG capsule, Take 20 mg by mouth daily., Disp: , Rfl:    oseltamivir (TAMIFLU) 75 MG capsule, Take 1 capsule (75 mg total) by mouth every 12 (twelve) hours., Disp: 10 capsule,  Rfl: 0   promethazine-dextromethorphan (PROMETHAZINE-DM) 6.25-15 MG/5ML syrup, Take 5 mLs by mouth at bedtime as needed for cough., Disp: 118 mL, Rfl: 0   rosuvastatin (CRESTOR) 5 MG tablet, TAKE 1 TABLET BY MOUTH ONCE DAILY AT BEDTIME, Disp: 90 tablet, Rfl: 0   Semaglutide, 1 MG/DOSE, 4 MG/3ML SOPN, Inject 1 mg into the skin once a week., Disp: 9 mL, Rfl: 2   telmisartan (MICARDIS) 40 MG tablet, Take 1 tablet (40 mg total) by mouth daily., Disp: 90 tablet, Rfl: 0   Allergies  Allergen Reactions   Lisinopril Cough     Review of Systems  Constitutional: Negative.   Respiratory: Negative.    Cardiovascular:  Negative for chest pain, palpitations and leg swelling.  Endocrine: Negative for polydipsia, polyphagia and polyuria.  Neurological:  Negative for dizziness and headaches.  Psychiatric/Behavioral:  Negative for  confusion. The patient is not nervous/anxious.      Today's Vitals   11/12/22 1206  BP: 122/72  Pulse: 85  Temp: 98.4 F (36.9 C)  TempSrc: Oral  Weight: 167 lb 6.4 oz (75.9 kg)  Height: 5\' 5"  (1.651 m)  PainSc: 0-No pain   Body mass index is 27.86 kg/m.  Wt Readings from Last 3 Encounters:  11/12/22 167 lb 6.4 oz (75.9 kg)  08/12/22 173 lb (78.5 kg)  05/30/22 175 lb (79.4 kg)                                    The 10-year ASCVD risk score (Arnett DK, et al., 2019) is: 23.3%   Values used to calculate the score:     Age: 36 years     Sex: Female     Is Non-Hispanic African American: Yes     Diabetic: Yes     Tobacco smoker: Yes     Systolic Blood Pressure: 122 mmHg     Is BP treated: Yes     HDL Cholesterol: 73 mg/dL     Total Cholesterol: 163 mg/dL  Objective:  Physical Exam Vitals reviewed.  Constitutional:      General: She is not in acute distress.    Appearance: Normal appearance.  HENT:     Head: Normocephalic.  Eyes:     Pupils: Pupils are equal, round, and reactive to light.  Cardiovascular:     Rate and Rhythm: Normal rate and regular rhythm.     Pulses: Normal pulses.     Heart sounds: Normal heart sounds. No murmur heard. Pulmonary:     Effort: Pulmonary effort is normal. No respiratory distress.     Breath sounds: Normal breath sounds. No wheezing.  Skin:    General: Skin is warm and dry.     Capillary Refill: Capillary refill takes less than 2 seconds.     Findings: Erythema (areas to arms slightly raised.) and rash present.  Neurological:     General: No focal deficit present.     Mental Status: She is alert and oriented to person, place, and time.     Cranial Nerves: No cranial nerve deficit.     Motor: No weakness.  Psychiatric:        Mood and Affect: Mood normal.        Behavior: Behavior normal.        Thought Content: Thought content normal.        Judgment: Judgment normal.         Assessment  And Plan:     1. Essential  hypertension Comments: Blood pressure is well controlled, continue current medications - Basic metabolic panel  2. Type 2 diabetes mellitus with hyperlipidemia ([facility]) Comments: Cholesterol levels are stable continue statin, tolerating well. HgbA1c was slightly improved at last visit, continue current medications - Basic metabolic panel - Hemoglobin A1c - Lipid panel - metFORMIN (GLUCOPHAGE) 500 MG tablet; Take 1 tablet (500 mg total) by mouth 2 (two) times daily with a meal.  Dispense: 180 tablet; Refill: 0  3. Osteopenia after menopause Comments: Vitamin d supplement and calcium supplement regularly, will repeat bone density in 2 years  4. Bug bite, initial encounter Comments: erythema to bilateral arms slightly raised, patient feels related to "mites" at her home    Return for controlled DM check 4 months.   Patient was given opportunity to ask questions. Patient verbalized understanding of the plan and was able to repeat key elements of the plan. All questions were answered to their satisfaction.  Danielle Felts, FNP   I, Danielle Felts, FNP, have reviewed all documentation for this visit. The documentation on 11/12/22 for the exam, diagnosis, procedures, and orders are all accurate and complete.   IF YOU HAVE BEEN REFERRED TO A SPECIALIST, IT MAY TAKE 1-2 WEEKS TO SCHEDULE/PROCESS THE REFERRAL. IF YOU HAVE NOT HEARD FROM US/SPECIALIST IN TWO WEEKS, PLEASE GIVE [place] A CALL AT [phone] X [phone].   THE PATIENT IS ENCOURAGED TO PRACTICE SOCIAL DISTANCING DUE TO THE COVID-19 PANDEMIC.

## 2022-11-12 NOTE — Patient Instructions (Signed)

## 2022-11-13 ENCOUNTER — Other Ambulatory Visit: Payer: Self-pay

## 2022-11-13 DIAGNOSIS — M8580 Other specified disorders of bone density and structure, unspecified site: Secondary | ICD-10-CM

## 2022-11-13 LAB — BASIC METABOLIC PANEL
BUN/Creatinine Ratio: 10 — ABNORMAL LOW (ref 12–28)
BUN: 9 mg/dL (ref 8–27)
CO2: 26 mmol/L (ref 20–29)
Calcium: 9.4 mg/dL (ref 8.7–10.3)
Chloride: 106 mmol/L (ref 96–106)
Creatinine, Ser: 0.88 mg/dL (ref 0.57–1.00)
Glucose: 86 mg/dL (ref 70–99)
Potassium: 4.1 mmol/L (ref 3.5–5.2)
Sodium: 145 mmol/L — ABNORMAL HIGH (ref 134–144)
eGFR: 74 mL/min/{1.73_m2} (ref >59)

## 2022-11-13 LAB — LIPID PANEL
Chol/HDL Ratio: 2.2 ratio (ref 0.0–4.4)
Cholesterol, Total: 163 mg/dL (ref 100–199)
HDL: 73 mg/dL (ref >39)
LDL Chol Calc (NIH): 76 mg/dL (ref 0–99)
Triglycerides: 75 mg/dL (ref 0–149)
VLDL Cholesterol Cal: 14 mg/dL (ref 5–40)

## 2022-11-13 LAB — HEMOGLOBIN A1C
Est. average glucose Bld gHb Est-mCnc: 120 mg/dL
Hgb A1c MFr Bld: 5.8 % — ABNORMAL HIGH (ref 4.8–5.6)

## 2022-11-13 MED ORDER — ALENDRONATE SODIUM 70 MG PO TABS: ORAL_TABLET | ORAL | 2 refills | Status: DC

## 2022-11-14 ENCOUNTER — Other Ambulatory Visit: Payer: Self-pay

## 2022-11-14 DIAGNOSIS — M8580 Other specified disorders of bone density and structure, unspecified site: Secondary | ICD-10-CM

## 2022-11-14 MED ORDER — ALENDRONATE SODIUM 70 MG PO TABS: ORAL_TABLET | ORAL | 2 refills | Status: DC

## 2023-01-05 ENCOUNTER — Other Ambulatory Visit: Payer: Self-pay | Admitting: Nurse Practitioner

## 2023-01-05 DIAGNOSIS — E782 Mixed hyperlipidemia: Secondary | ICD-10-CM

## 2023-02-10 ENCOUNTER — Other Ambulatory Visit: Payer: Self-pay | Admitting: Nurse Practitioner

## 2023-02-10 DIAGNOSIS — E1169 Type 2 diabetes mellitus with other specified complication: Secondary | ICD-10-CM

## 2023-02-20 ENCOUNTER — Other Ambulatory Visit: Payer: Self-pay | Admitting: Nurse Practitioner

## 2023-02-20 DIAGNOSIS — F3342 Major depressive disorder, recurrent, in full remission: Secondary | ICD-10-CM

## 2023-02-25 ENCOUNTER — Other Ambulatory Visit: Payer: Self-pay

## 2023-02-25 DIAGNOSIS — F3342 Major depressive disorder, recurrent, in full remission: Secondary | ICD-10-CM

## 2023-02-25 MED ORDER — CITALOPRAM HYDROBROMIDE 40 MG PO TABS: 40.0000 mg | ORAL_TABLET | Freq: Every day | ORAL | 0 refills | Status: DC

## 2023-03-10 NOTE — Progress Notes (Unsigned)
Danielle Calderon, CMA,acting as a Neurosurgeon for Danielle Felts, FNP.,have documented all relevant documentation on the behalf of Danielle Felts, FNP,as directed by  Danielle Felts, FNP while in the presence of Danielle Felts, FNP.  Subjective:    Patient ID: Danielle Calderon , female    DOB: June 01, 1961 , 62 y.o.   MRN: [id number]  No chief complaint on file.   HPI  Patient presents today for HM, patient reports compliance with medication. Patient denies any chest pain, SOB, or headaches. Patient has no concerns today.     Past Medical History:  Diagnosis Date  . Anxiety   . Depression   . Diabetes mellitus without complication (HCC)    Type II  . GERD (gastroesophageal reflux disease)   . Headache    mild daily takes gabapentin  . History of migraine headaches   . Hyperlipidemia   . Hypertension   . Obesity (BMI 30-39.9) 03/21/2020     Family History  Problem Relation Age of Onset  . Hypertension Mother   . Hypertension Father   . Cancer Father   . Diabetes Father   . Breast cancer Maternal Aunt   . AAA (abdominal aortic aneurysm) Maternal Aunt   . Cancer Maternal Great-grandmother        cervical vs. uterine (sounds like cervical, treated with cryotherapy)  . Breast cancer Cousin        maternal     Current Outpatient Medications:  .  albuterol (VENTOLIN HFA) 108 (90 Base) MCG/ACT inhaler, Inhale 1-2 puffs into the lungs every 6 (six) hours as needed for wheezing or shortness of breath., Disp: 8 g, Rfl: 0 .  alendronate (FOSAMAX) 70 MG tablet, Take with a full glass of water on an empty stomach ONCE A WEEK., Disp: 4 tablet, Rfl: 2 .  benzonatate (TESSALON) 100 MG capsule, Take 1 capsule (100 mg total) by mouth every 8 (eight) hours., Disp: 21 capsule, Rfl: 0 .  calcium carbonate (OS-CAL) 600 MG TABS tablet, Take 1,200 mg by mouth at bedtime., Disp: , Rfl:  .  Cholecalciferol (VITAMIN D) 50 MCG (2000 UT) CAPS, Take 2,000 Units by mouth at bedtime., Disp: , Rfl:  .  citalopram (CELEXA)  40 MG tablet, Take 1 tablet (40 mg total) by mouth at bedtime., Disp: 90 tablet, Rfl: 0 .  diphenhydrAMINE (SOMINEX) 25 MG tablet, Take 25 mg by mouth at bedtime as needed for allergies or sleep., Disp: , Rfl:  .  fluticasone (FLONASE) 50 MCG/ACT nasal spray, Place 1 spray into both nostrils daily., Disp: 16 g, Rfl: 0 .  gabapentin (NEURONTIN) 300 MG capsule, Take 1 capsule (300 mg total) by mouth 3 (three) times daily., Disp: 90 capsule, Rfl: 5 .  LORazepam (ATIVAN) 0.5 MG tablet, Take 1-2 pills 30 minutes prior to MRI, Disp: 2 tablet, Rfl: 0 .  metFORMIN (GLUCOPHAGE) 500 MG tablet, TAKE 1 TABLET BY MOUTH TWICE DAILY WITH A MEAL, Disp: 180 tablet, Rfl: 0 .  omeprazole (PRILOSEC) 20 MG capsule, Take 20 mg by mouth daily., Disp: , Rfl:  .  oseltamivir (TAMIFLU) 75 MG capsule, Take 1 capsule (75 mg total) by mouth every 12 (twelve) hours., Disp: 10 capsule, Rfl: 0 .  promethazine-dextromethorphan (PROMETHAZINE-DM) 6.25-15 MG/5ML syrup, Take 5 mLs by mouth at bedtime as needed for cough., Disp: 118 mL, Rfl: 0 .  rosuvastatin (CRESTOR) 5 MG tablet, TAKE 1 TABLET BY MOUTH ONCE DAILY AT BEDTIME, Disp: 90 tablet, Rfl: 0 .  Semaglutide, 1 MG/DOSE, 4 MG/3ML SOPN,  Inject 1 mg into the skin once a week., Disp: 9 mL, Rfl: 2 .  telmisartan (MICARDIS) 40 MG tablet, Take 1 tablet (40 mg total) by mouth daily., Disp: 90 tablet, Rfl: 0   Allergies  Allergen Reactions  . Lisinopril Cough      The patient states she uses {contraceptive methods:5051} for birth control. No LMP recorded. Patient is postmenopausal.. {Dysmenorrhea-menorrhagia:21918}. Negative for: breast discharge, breast lump(s), breast pain and breast self exam. Associated symptoms include abnormal vaginal bleeding. Pertinent negatives include abnormal bleeding (hematology), anxiety, decreased libido, depression, difficulty falling sleep, dyspareunia, history of infertility, nocturia, sexual dysfunction, sleep disturbances, urinary incontinence,  urinary urgency, vaginal discharge and vaginal itching. Diet regular.The patient states her exercise level is    . The patient's tobacco use is:  Social History   Tobacco Use  Smoking Status Former  . Current packs/day: 0.00  . Types: Cigarettes  . Start date: 13  . Quit date: 2004  . Years since quitting: 20.7  Smokeless Tobacco Never  . She has been exposed to passive smoke. The patient's alcohol use is:  Social History   Substance and Sexual Activity  Alcohol Use Yes  . Alcohol/week: 2.0 standard drinks of alcohol  . Types: 2 Glasses of wine per week  . Additional information: Last pap ***, next one scheduled for ***.    Review of Systems   There were no vitals filed for this visit. There is no height or weight on file to calculate BMI.  Wt Readings from Last 3 Encounters:  11/12/22 167 lb 6.4 oz (75.9 kg)  08/12/22 173 lb (78.5 kg)  05/30/22 175 lb (79.4 kg)     Objective:  Physical Exam      Assessment And Plan:     Encounter for annual health examination  Essential hypertension  Type 2 diabetes mellitus with hyperlipidemia (HCC)  Mixed hyperlipidemia     No follow-ups on file. Patient was given opportunity to ask questions. Patient verbalized understanding of the plan and was able to repeat key elements of the plan. All questions were answered to their satisfaction.   Danielle Felts, FNP  I, Danielle Felts, FNP, have reviewed all documentation for this visit. The documentation on 03/10/23 for the exam, diagnosis, procedures, and orders are all accurate and complete.

## 2023-03-11 ENCOUNTER — Encounter: Payer: Self-pay | Admitting: Nurse Practitioner

## 2023-03-11 ENCOUNTER — Ambulatory Visit (INDEPENDENT_AMBULATORY_CARE_PROVIDER_SITE_OTHER): Payer: BC Managed Care – PPO | Admitting: Nurse Practitioner

## 2023-03-11 VITALS — BP 124/80 | HR 60 | Temp 98.4°F | Ht 65.0 in | Wt 168.2 lb

## 2023-03-11 DIAGNOSIS — Z8616 Personal history of COVID-19: Secondary | ICD-10-CM

## 2023-03-11 DIAGNOSIS — Z0000 Encounter for general adult medical examination without abnormal findings: Secondary | ICD-10-CM | POA: Diagnosis not present

## 2023-03-11 DIAGNOSIS — E1169 Type 2 diabetes mellitus with other specified complication: Secondary | ICD-10-CM | POA: Diagnosis not present

## 2023-03-11 DIAGNOSIS — Z79899 Other long term (current) drug therapy: Secondary | ICD-10-CM

## 2023-03-11 DIAGNOSIS — E782 Mixed hyperlipidemia: Secondary | ICD-10-CM

## 2023-03-11 DIAGNOSIS — M8580 Other specified disorders of bone density and structure, unspecified site: Secondary | ICD-10-CM

## 2023-03-11 DIAGNOSIS — I10 Essential (primary) hypertension: Secondary | ICD-10-CM

## 2023-03-11 DIAGNOSIS — Z780 Asymptomatic menopausal state: Secondary | ICD-10-CM

## 2023-03-11 DIAGNOSIS — E785 Hyperlipidemia, unspecified: Secondary | ICD-10-CM | POA: Diagnosis not present

## 2023-03-11 DIAGNOSIS — R051 Acute cough: Secondary | ICD-10-CM

## 2023-03-11 DIAGNOSIS — Z2821 Immunization not carried out because of patient refusal: Secondary | ICD-10-CM

## 2023-03-11 HISTORY — DX: Personal history of COVID-19: Z86.16

## 2023-03-11 LAB — POCT URINALYSIS DIP (CLINITEK)
Bilirubin, UA: NEGATIVE
Blood, UA: NEGATIVE
Glucose, UA: NEGATIVE mg/dL
Ketones, POC UA: NEGATIVE mg/dL
Nitrite, UA: NEGATIVE
POC PROTEIN,UA: NEGATIVE
Spec Grav, UA: 1.02 (ref 1.010–1.025)
Urobilinogen, UA: 0.2 U/dL
pH, UA: 7 (ref 5.0–8.0)

## 2023-03-11 MED ORDER — ALENDRONATE SODIUM 70 MG PO TABS
ORAL_TABLET | ORAL | 1 refills | Status: DC
Start: 2023-03-11 — End: 2023-11-11

## 2023-03-11 MED ORDER — AMOXICILLIN-POT CLAVULANATE 875-125 MG PO TABS: 1.0000 | ORAL_TABLET | Freq: Two times a day (BID) | ORAL | 0 refills | Status: DC

## 2023-03-11 NOTE — Assessment & Plan Note (Addendum)
Behavior modifications discussed and diet history reviewed.   Pt will continue to exercise regularly and modify diet with low GI, plant based foods and decrease intake of processed foods.  Recommend intake of daily multivitamin, Vitamin D, and calcium.  Recommend mammogram and colonoscopy for preventive screenings, as well as recommend immunizations that include influenza (declined), TDAP, and Shingles

## 2023-03-11 NOTE — Assessment & Plan Note (Deleted)
Cholesterol levels are good but still not at goal of LDL less than 70

## 2023-03-11 NOTE — Assessment & Plan Note (Signed)
Since having yellow phelgm and had covid one month ago will treat for possible upper respiratory infection. Lung sounds are clear and equal bilaterally

## 2023-03-11 NOTE — Assessment & Plan Note (Signed)
She had covid within the last month, no treatment but continues to have a cough

## 2023-03-11 NOTE — Assessment & Plan Note (Signed)

## 2023-03-11 NOTE — Assessment & Plan Note (Signed)
She is to continue Fosamax and encouraged to increase her low impact walking

## 2023-03-11 NOTE — Assessment & Plan Note (Addendum)
HgbA1c is improving, continue current medications. She is tolerating well. Cholesterol levels are good but still not at goal of LDL less than 70

## 2023-03-11 NOTE — Assessment & Plan Note (Addendum)
Blood pressure is stable. Continue current medications. Will check eGFR

## 2023-03-11 NOTE — Assessment & Plan Note (Signed)
She may get at work.

## 2023-03-12 LAB — CMP14+EGFR
ALT: 26 IU/L (ref 0–32)
AST: 34 IU/L (ref 0–40)
Albumin: 4.1 g/dL (ref 3.9–4.9)
Alkaline Phosphatase: 53 IU/L (ref 44–121)
BUN/Creatinine Ratio: 15 (ref 12–28)
BUN: 13 mg/dL (ref 8–27)
Bilirubin Total: 0.4 mg/dL (ref 0.0–1.2)
CO2: 24 mmol/L (ref 20–29)
Calcium: 8.9 mg/dL (ref 8.7–10.3)
Chloride: 105 mmol/L (ref 96–106)
Creatinine, Ser: 0.88 mg/dL (ref 0.57–1.00)
Globulin, Total: 3.2 g/dL (ref 1.5–4.5)
Glucose: 76 mg/dL (ref 70–99)
Potassium: 4.8 mmol/L (ref 3.5–5.2)
Sodium: 139 mmol/L (ref 134–144)
Total Protein: 7.3 g/dL (ref 6.0–8.5)
eGFR: 74 mL/min/{1.73_m2} (ref >59)

## 2023-03-12 LAB — CBC WITH DIFFERENTIAL/PLATELET
Basophils Absolute: 0 10*3/uL (ref 0.0–0.2)
Basos: 0 %
EOS (ABSOLUTE): 0.1 10*3/uL (ref 0.0–0.4)
Eos: 3 %
Hematocrit: 38 % (ref 34.0–46.6)
Hemoglobin: 11.6 g/dL (ref 11.1–15.9)
Immature Grans (Abs): 0 10*3/uL (ref 0.0–0.1)
Immature Granulocytes: 0 %
Lymphocytes Absolute: 2.2 10*3/uL (ref 0.7–3.1)
Lymphs: 48 %
MCH: 25.7 pg — ABNORMAL LOW (ref 26.6–33.0)
MCHC: 30.5 g/dL — ABNORMAL LOW (ref 31.5–35.7)
MCV: 84 fL (ref 79–97)
Monocytes Absolute: 0.4 10*3/uL (ref 0.1–0.9)
Monocytes: 8 %
Neutrophils Absolute: 1.9 10*3/uL (ref 1.4–7.0)
Neutrophils: 41 %
Platelets: 225 10*3/uL (ref 150–450)
RBC: 4.52 x10E6/uL (ref 3.77–5.28)
RDW: 13.9 % (ref 11.7–15.4)
WBC: 4.6 10*3/uL (ref 3.4–10.8)

## 2023-03-12 LAB — LIPID PANEL
Chol/HDL Ratio: 2.4 ratio (ref 0.0–4.4)
Cholesterol, Total: 177 mg/dL (ref 100–199)
HDL: 75 mg/dL (ref >39)
LDL Chol Calc (NIH): 92 mg/dL (ref 0–99)
Triglycerides: 52 mg/dL (ref 0–149)
VLDL Cholesterol Cal: 10 mg/dL (ref 5–40)

## 2023-03-12 LAB — HEMOGLOBIN A1C
Est. average glucose Bld gHb Est-mCnc: 123 mg/dL
Hgb A1c MFr Bld: 5.9 % — ABNORMAL HIGH (ref 4.8–5.6)

## 2023-03-12 LAB — MICROALBUMIN / CREATININE URINE RATIO
Creatinine, Urine: 86.4 mg/dL
Microalb/Creat Ratio: <3 mg/g{creat} (ref 0–29)
Microalbumin, Urine: <3 ug/mL

## 2023-04-14 ENCOUNTER — Other Ambulatory Visit: Payer: Self-pay | Admitting: Nurse Practitioner

## 2023-04-14 DIAGNOSIS — E782 Mixed hyperlipidemia: Secondary | ICD-10-CM

## 2023-04-21 DIAGNOSIS — H5203 Hypermetropia, bilateral: Secondary | ICD-10-CM | POA: Diagnosis not present

## 2023-04-21 DIAGNOSIS — H2513 Age-related nuclear cataract, bilateral: Secondary | ICD-10-CM | POA: Diagnosis not present

## 2023-04-21 DIAGNOSIS — E119 Type 2 diabetes mellitus without complications: Secondary | ICD-10-CM | POA: Diagnosis not present

## 2023-04-21 LAB — HM DIABETES EYE EXAM

## 2023-05-02 ENCOUNTER — Other Ambulatory Visit: Payer: Self-pay | Admitting: Nurse Practitioner

## 2023-05-02 DIAGNOSIS — E1169 Type 2 diabetes mellitus with other specified complication: Secondary | ICD-10-CM

## 2023-05-02 DIAGNOSIS — E669 Obesity, unspecified: Secondary | ICD-10-CM

## 2023-05-15 ENCOUNTER — Other Ambulatory Visit: Payer: Self-pay | Admitting: Nurse Practitioner

## 2023-05-15 ENCOUNTER — Other Ambulatory Visit: Payer: Self-pay

## 2023-05-15 MED ORDER — TELMISARTAN 40 MG PO TABS: 40.0000 mg | ORAL_TABLET | Freq: Every day | ORAL | 1 refills | Status: DC

## 2023-05-28 ENCOUNTER — Other Ambulatory Visit: Payer: Self-pay | Admitting: Nurse Practitioner

## 2023-05-28 DIAGNOSIS — F3342 Major depressive disorder, recurrent, in full remission: Secondary | ICD-10-CM

## 2023-06-01 ENCOUNTER — Other Ambulatory Visit: Payer: Self-pay | Admitting: Nurse Practitioner

## 2023-06-01 DIAGNOSIS — E1169 Type 2 diabetes mellitus with other specified complication: Secondary | ICD-10-CM

## 2023-07-14 ENCOUNTER — Encounter: Payer: Self-pay | Admitting: Nurse Practitioner

## 2023-07-14 ENCOUNTER — Ambulatory Visit (INDEPENDENT_AMBULATORY_CARE_PROVIDER_SITE_OTHER): Payer: BC Managed Care – PPO | Admitting: Nurse Practitioner

## 2023-07-14 VITALS — BP 110/70 | HR 65 | Temp 97.6°F | Ht 65.0 in | Wt 171.2 lb

## 2023-07-14 DIAGNOSIS — E1169 Type 2 diabetes mellitus with other specified complication: Secondary | ICD-10-CM | POA: Diagnosis not present

## 2023-07-14 DIAGNOSIS — E785 Hyperlipidemia, unspecified: Secondary | ICD-10-CM

## 2023-07-14 DIAGNOSIS — M79674 Pain in right toe(s): Secondary | ICD-10-CM | POA: Diagnosis not present

## 2023-07-14 DIAGNOSIS — Z6828 Body mass index (BMI) 28.0-28.9, adult: Secondary | ICD-10-CM

## 2023-07-14 DIAGNOSIS — G629 Polyneuropathy, unspecified: Secondary | ICD-10-CM

## 2023-07-14 DIAGNOSIS — I10 Essential (primary) hypertension: Secondary | ICD-10-CM | POA: Diagnosis not present

## 2023-07-14 DIAGNOSIS — F3342 Major depressive disorder, recurrent, in full remission: Secondary | ICD-10-CM

## 2023-07-14 DIAGNOSIS — Z23 Encounter for immunization: Secondary | ICD-10-CM | POA: Diagnosis not present

## 2023-07-14 DIAGNOSIS — E782 Mixed hyperlipidemia: Secondary | ICD-10-CM | POA: Diagnosis not present

## 2023-07-14 DIAGNOSIS — M21611 Bunion of right foot: Secondary | ICD-10-CM

## 2023-07-14 MED ORDER — CITALOPRAM HYDROBROMIDE 40 MG PO TABS: 40.0000 mg | ORAL_TABLET | Freq: Every day | ORAL | 1 refills | Status: DC

## 2023-07-14 MED ORDER — GABAPENTIN 300 MG PO CAPS: 300.0000 mg | ORAL_CAPSULE | Freq: Three times a day (TID) | ORAL | 1 refills | Status: DC

## 2023-07-14 MED ORDER — LANCET DEVICE MISC: 1.0000 | Freq: Three times a day (TID) | 1 refills | Status: AC

## 2023-07-14 NOTE — Progress Notes (Signed)
 LILLETTE Kristeen JINNY Gladis, CMA,acting as a neurosurgeon for Gaines Ada, FNP.,have documented all relevant documentation on the behalf of Gaines Ada, FNP,as directed by  Gaines Ada, FNP while in the presence of Gaines Ada, FNP.  Subjective:  Patient ID: Danielle Calderon , female    DOB: 06/27/1961 , 63 y.o.   MRN: [id number]  Chief Complaint  Patient presents with   Hypertension    HPI  Patient presents today for a bp and dm follow up, Patient reports compliance with medication. Patient denies any chest pain, SOB, or headaches. Patient reports her 4th toe on her right foot hurts and burns all day.   Diabetes She presents for her follow-up diabetic visit. She has type 2 diabetes mellitus. Her disease course has been stable. Pertinent negatives for hypoglycemia include no confusion, dizziness, headaches or nervousness/anxiousness. Pertinent negatives for diabetes include no chest pain, no polydipsia, no polyphagia and no polyuria. There are no hypoglycemic complications. There are no diabetic complications. Risk factors for coronary artery disease include obesity. Current diabetic treatment includes oral agent (monotherapy). She is compliant with treatment all of the time. She is following a generally healthy (she is doing better with her diet) diet. When asked about meal planning, she reported none. She has not had a previous visit with a dietitian. She participates in exercise three times a week. (Not checking due to not wanting to stick her finger) An ACE inhibitor/angiotensin II receptor blocker is being taken. She does not see a podiatrist.Eye exam is current.  Hypertension This is a chronic problem. The current episode started more than 1 year ago. The problem is unchanged. The problem is uncontrolled (has not taken her medication today). Pertinent negatives include no anxiety, chest pain, headaches or palpitations. There are no associated agents to hypertension. Risk factors for coronary artery disease  include obesity and sedentary lifestyle. Past treatments include angiotensin blockers. Compliance problems include exercise and diet.  There is no history of kidney disease. There is no history of chronic renal disease.     Past Medical History:  Diagnosis Date   Anxiety    Depression    Diabetes mellitus without complication (HCC)    Type II   GERD (gastroesophageal reflux disease)    Headache    mild daily takes gabapentin    History of migraine headaches    Hyperlipidemia    Hypertension    Obesity (BMI 30-39.9) 03/21/2020     Family History  Problem Relation Age of Onset   Hypertension Mother    Hypertension Father    Cancer Father    Diabetes Father    Breast cancer Maternal Aunt    AAA (abdominal aortic aneurysm) Maternal Aunt    Cancer Maternal Great-grandmother        cervical vs. uterine (sounds like cervical, treated with cryotherapy)   Breast cancer Cousin        maternal     Current Outpatient Medications:    albuterol  (VENTOLIN  HFA) 108 (90 Base) MCG/ACT inhaler, Inhale 1-2 puffs into the lungs every 6 (six) hours as needed for wheezing or shortness of breath., Disp: 8 g, Rfl: 0   alendronate  (FOSAMAX ) 70 MG tablet, Take with a full glass of water on an empty stomach ONCE A WEEK., Disp: 12 tablet, Rfl: 1   amoxicillin -clavulanate (AUGMENTIN ) 875-125 MG tablet, Take 1 tablet by mouth 2 (two) times daily., Disp: 14 tablet, Rfl: 0   benzonatate  (TESSALON ) 100 MG capsule, Take 1 capsule (100 mg total) by mouth every 8 (  eight) hours., Disp: 21 capsule, Rfl: 0   calcium  carbonate (OS-CAL) 600 MG TABS tablet, Take 1,200 mg by mouth at bedtime., Disp: , Rfl:    Cholecalciferol (VITAMIN D) 50 MCG (2000 UT) CAPS, Take 2,000 Units by mouth at bedtime., Disp: , Rfl:    diphenhydrAMINE (SOMINEX) 25 MG tablet, Take 25 mg by mouth at bedtime as needed for allergies or sleep., Disp: , Rfl:    fluticasone  (FLONASE ) 50 MCG/ACT nasal spray, Place 1 spray into both nostrils daily.,  Disp: 16 g, Rfl: 0   Lancet Device MISC, 1 each by Does not apply route in the morning, at noon, and at bedtime. May substitute to any manufacturer covered by patient's insurance., Disp: 1 each, Rfl: 1   LORazepam  (ATIVAN ) 0.5 MG tablet, Take 1-2 pills 30 minutes prior to MRI, Disp: 2 tablet, Rfl: 0   metFORMIN  (GLUCOPHAGE ) 500 MG tablet, TAKE 1 TABLET BY MOUTH TWICE DAILY WITH A MEAL, Disp: 180 tablet, Rfl: 0   omeprazole (PRILOSEC) 20 MG capsule, Take 20 mg by mouth daily., Disp: , Rfl:    OZEMPIC , 1 MG/DOSE, 4 MG/3ML SOPN, INJECT 1 MG INTO THE SKIN ONCE A WEEK, Disp: 9 mL, Rfl: 0   telmisartan  (MICARDIS ) 40 MG tablet, Take 1 tablet (40 mg total) by mouth daily., Disp: 90 tablet, Rfl: 1   Accu-Chek Softclix Lancets lancets, Use as instructed, Disp: 100 each, Rfl: 2   Blood Glucose Monitoring Suppl (ACCU-CHEK GUIDE) w/Device KIT, Use 2 times daily to check blood sugar., Disp: 1 kit, Rfl: 0   citalopram  (CELEXA ) 40 MG tablet, Take 1 tablet (40 mg total) by mouth at bedtime., Disp: 90 tablet, Rfl: 1   gabapentin  (NEURONTIN ) 300 MG capsule, Take 1 capsule (300 mg total) by mouth 3 (three) times daily., Disp: 270 capsule, Rfl: 1   rosuvastatin  (CRESTOR ) 5 MG tablet, TAKE 1 TABLET BY MOUTH ONCE DAILY AT BEDTIME, Disp: 90 tablet, Rfl: 1   Allergies  Allergen Reactions   Lisinopril Cough     Review of Systems  Constitutional: Negative.   HENT: Negative.    Eyes: Negative.   Respiratory: Negative.    Cardiovascular: Negative.  Negative for chest pain, palpitations and leg swelling.  Gastrointestinal: Negative.   Endocrine: Negative for polydipsia, polyphagia and polyuria.  Musculoskeletal: Negative.   Neurological: Negative.  Negative for dizziness and headaches.  Psychiatric/Behavioral: Negative.  Negative for confusion. The patient is not nervous/anxious.      Today's Vitals   07/14/23 1209  BP: 110/70  Pulse: 65  Temp: 97.6 F (36.4 C)  TempSrc: Oral  Weight: 171 lb 3.2 oz (77.7 kg)   Height: 5' 5 (1.651 m)  PainSc: 0-No pain   Body mass index is 28.49 kg/m.  Wt Readings from Last 3 Encounters:  07/14/23 171 lb 3.2 oz (77.7 kg)  03/11/23 168 lb 3.2 oz (76.3 kg)  11/12/22 167 lb 6.4 oz (75.9 kg)    Objective:  Physical Exam Vitals reviewed.  Constitutional:      General: She is not in acute distress.    Appearance: Normal appearance.  HENT:     Head: Normocephalic.  Eyes:     Pupils: Pupils are equal, round, and reactive to light.  Cardiovascular:     Rate and Rhythm: Normal rate and regular rhythm.     Pulses: Normal pulses.     Heart sounds: Normal heart sounds. No murmur heard. Pulmonary:     Effort: Pulmonary effort is normal. No respiratory distress.  Breath sounds: Normal breath sounds. No wheezing.  Musculoskeletal:        General: Tenderness (right medial 4th metatarsal) present.     Comments: Firm area noted to 4th metatarsal medial aspect  Skin:    General: Skin is warm and dry.     Capillary Refill: Capillary refill takes less than 2 seconds.  Neurological:     General: No focal deficit present.     Mental Status: She is alert and oriented to person, place, and time.     Cranial Nerves: No cranial nerve deficit.     Motor: No weakness.  Psychiatric:        Mood and Affect: Mood normal.        Behavior: Behavior normal.        Thought Content: Thought content normal.        Judgment: Judgment normal.         Assessment And Plan:  Essential hypertension Assessment & Plan: Blood pressure is stable. Continue current medications. Will check eGFR   Type 2 diabetes mellitus with hyperlipidemia (HCC) Assessment & Plan: HgbA1c is improving, continue current medications. She is tolerating well. Cholesterol levels are good but still not at goal of LDL less than 70  Orders: -     BMP8+eGFR -     Hemoglobin A1c -     Lipid panel -     Lancet Device; 1 each by Does not apply route in the morning, at noon, and at bedtime. May  substitute to any manufacturer covered by patient's insurance.  Dispense: 1 each; Refill: 1  Pain of toe of right foot Assessment & Plan: 4th metatarsal with pain will refer to podiatry for further evaluation   Bunion, right foot Assessment & Plan: Will refer to Podiatry  Orders: -     Ambulatory referral to Podiatry  Neuropathy Assessment & Plan: Continue current medications.   Orders: -     Gabapentin ; Take 1 capsule (300 mg total) by mouth 3 (three) times daily.  Dispense: 270 capsule; Refill: 1  Recurrent major depressive disorder, in full remission (HCC) -     Citalopram  Hydrobromide; Take 1 tablet (40 mg total) by mouth at bedtime.  Dispense: 90 tablet; Refill: 1  BMI 28.0-28.9,adult  Need for pneumococcal 20-valent conjugate vaccination Assessment & Plan: Given in office  Orders: -     Pneumococcal conjugate vaccine 20-valent    Return for controlled DM check 4 months.  Patient was given opportunity to ask questions. Patient verbalized understanding of the plan and was able to repeat key elements of the plan. All questions were answered to their satisfaction.    LILLETTE Gaines Ada, FNP, have reviewed all documentation for this visit. The documentation on 07/14/23 for the exam, diagnosis, procedures, and orders are all accurate and complete.   IF YOU HAVE BEEN REFERRED TO A SPECIALIST, IT MAY TAKE 1-2 WEEKS TO SCHEDULE/PROCESS THE REFERRAL. IF YOU HAVE NOT HEARD FROM US /SPECIALIST IN TWO WEEKS, PLEASE GIVE US  A CALL AT [phone].

## 2023-07-15 LAB — BMP8+EGFR
BUN/Creatinine Ratio: 14 (ref 12–28)
BUN: 10 mg/dL (ref 8–27)
CO2: 28 mmol/L (ref 20–29)
Calcium: 9.2 mg/dL (ref 8.7–10.3)
Chloride: 103 mmol/L (ref 96–106)
Creatinine, Ser: 0.74 mg/dL (ref 0.57–1.00)
Glucose: 84 mg/dL (ref 70–99)
Potassium: 4.4 mmol/L (ref 3.5–5.2)
Sodium: 143 mmol/L (ref 134–144)
eGFR: 90 mL/min/{1.73_m2} (ref >59)

## 2023-07-15 LAB — HEMOGLOBIN A1C
Est. average glucose Bld gHb Est-mCnc: 120 mg/dL
Hgb A1c MFr Bld: 5.8 % — ABNORMAL HIGH (ref 4.8–5.6)

## 2023-07-15 LAB — LIPID PANEL
Chol/HDL Ratio: 2.4 {ratio} (ref 0.0–4.4)
Cholesterol, Total: 167 mg/dL (ref 100–199)
HDL: 69 mg/dL (ref >39)
LDL Chol Calc (NIH): 87 mg/dL (ref 0–99)
Triglycerides: 57 mg/dL (ref 0–149)
VLDL Cholesterol Cal: 11 mg/dL (ref 5–40)

## 2023-07-17 ENCOUNTER — Other Ambulatory Visit: Payer: Self-pay | Admitting: Nurse Practitioner

## 2023-07-17 DIAGNOSIS — E782 Mixed hyperlipidemia: Secondary | ICD-10-CM

## 2023-07-24 ENCOUNTER — Other Ambulatory Visit: Payer: Self-pay

## 2023-07-24 DIAGNOSIS — M21611 Bunion of right foot: Secondary | ICD-10-CM | POA: Insufficient documentation

## 2023-07-24 DIAGNOSIS — M79674 Pain in right toe(s): Secondary | ICD-10-CM | POA: Insufficient documentation

## 2023-07-24 DIAGNOSIS — E1169 Type 2 diabetes mellitus with other specified complication: Secondary | ICD-10-CM

## 2023-07-24 DIAGNOSIS — Z23 Encounter for immunization: Secondary | ICD-10-CM | POA: Insufficient documentation

## 2023-07-24 DIAGNOSIS — G629 Polyneuropathy, unspecified: Secondary | ICD-10-CM | POA: Insufficient documentation

## 2023-07-24 MED ORDER — ACCU-CHEK SOFTCLIX LANCETS MISC
2 refills | Status: AC
Start: 2023-07-24 — End: ?

## 2023-07-24 MED ORDER — ACCU-CHEK GUIDE W/DEVICE KIT: PACK | 0 refills | Status: AC

## 2023-07-24 NOTE — Assessment & Plan Note (Signed)
Will refer to Podiatry

## 2023-07-24 NOTE — Assessment & Plan Note (Signed)
4th metatarsal with pain will refer to podiatry for further evaluation

## 2023-07-24 NOTE — Assessment & Plan Note (Signed)
Given in office

## 2023-07-24 NOTE — Assessment & Plan Note (Signed)
Continue current medications. 

## 2023-07-24 NOTE — Assessment & Plan Note (Signed)
Blood pressure is stable. Continue current medications. Will check eGFR

## 2023-07-24 NOTE — Assessment & Plan Note (Signed)
HgbA1c is improving, continue current medications. She is tolerating well. Cholesterol levels are good but still not at goal of LDL less than 70

## 2023-07-26 ENCOUNTER — Other Ambulatory Visit: Payer: Self-pay | Admitting: Nurse Practitioner

## 2023-07-26 DIAGNOSIS — E1169 Type 2 diabetes mellitus with other specified complication: Secondary | ICD-10-CM

## 2023-08-01 ENCOUNTER — Encounter: Payer: Self-pay | Admitting: Podiatry

## 2023-08-01 ENCOUNTER — Ambulatory Visit: Payer: BC Managed Care – PPO | Admitting: Podiatry

## 2023-08-01 ENCOUNTER — Ambulatory Visit (INDEPENDENT_AMBULATORY_CARE_PROVIDER_SITE_OTHER): Payer: BC Managed Care – PPO

## 2023-08-01 DIAGNOSIS — M2061 Acquired deformities of toe(s), unspecified, right foot: Secondary | ICD-10-CM | POA: Diagnosis not present

## 2023-08-01 DIAGNOSIS — M778 Other enthesopathies, not elsewhere classified: Secondary | ICD-10-CM | POA: Diagnosis not present

## 2023-08-01 DIAGNOSIS — L84 Corns and callosities: Secondary | ICD-10-CM

## 2023-08-01 NOTE — Progress Notes (Unsigned)
Subjective:   Patient ID: Danielle Calderon, female   DOB: 63 y.o.   MRN: [id number]   HPI Chief Complaint  Patient presents with   Bunions    Rm#13 Right foot forth toe bunion causing pain started hurting 2 weeks ago taking tylenol as needed and using a patch for pain has had relief from 10 pain scale down to a 17.   63 year old female presents the office with above concerns.  She states that it looks like a 'bunion" on the top of her fourth toe.  Initially the pain was a 10 out of 10 but now up to a 10.  She is using over-the-counter Dr. Margart Sickles medicated pad.  She said that she had ingrown toenail removed previously and afterwards is when the "bunion" formed.  No drainage or pus.  No open lesions.  Review of Systems  All other systems reviewed and are negative.  Past Medical History:  Diagnosis Date   Anxiety    Depression    Diabetes mellitus without complication (HCC)    Type II   GERD (gastroesophageal reflux disease)    Headache    mild daily takes gabapentin   History of migraine headaches    Hyperlipidemia    Hypertension    Obesity (BMI 30-39.9) 03/21/2020    Past Surgical History:  Procedure Laterality Date   COLONOSCOPY W/ POLYPECTOMY     DILATATION & CURETTAGE/HYSTEROSCOPY WITH MYOSURE N/A 03/14/2020   Procedure: DILATATION & CURETTAGE/HYSTEROSCOPY WITH MYOSURE;  Surgeon: Conan Bowens, MD;  Location: [facility] OR;  Service: Gynecology;  Laterality: N/A;   RADIOLOGY WITH ANESTHESIA N/A 05/21/2022   Procedure: MRI BRAIN WITH AND WITHOUT CONTRAST WITH ANESTHESIA;  Surgeon: Radiologist, Medication, MD;  Location: [facility] OR;  Service: Radiology;  Laterality: N/A;   ROBOTIC ASSISTED TOTAL HYSTERECTOMY WITH BILATERAL SALPINGO OOPHERECTOMY N/A 03/27/2020   Procedure: XI ROBOTIC ASSISTED TOTAL HYSTERECTOMY WITH BILATERAL SALPINGO OOPHORECTOMY;  Surgeon: Carver Fila, MD;  Location: [facility];  Service: Gynecology;  Laterality: N/A;     Current Outpatient  Medications:    Accu-Chek Softclix Lancets lancets, Use as instructed, Disp: 100 each, Rfl: 2   albuterol (VENTOLIN HFA) 108 (90 Base) MCG/ACT inhaler, Inhale 1-2 puffs into the lungs every 6 (six) hours as needed for wheezing or shortness of breath., Disp: 8 g, Rfl: 0   alendronate (FOSAMAX) 70 MG tablet, Take with a full glass of water on an empty stomach ONCE A WEEK., Disp: 12 tablet, Rfl: 1   amoxicillin-clavulanate (AUGMENTIN) 875-125 MG tablet, Take 1 tablet by mouth 2 (two) times daily., Disp: 14 tablet, Rfl: 0   benzonatate (TESSALON) 100 MG capsule, Take 1 capsule (100 mg total) by mouth every 8 (eight) hours., Disp: 21 capsule, Rfl: 0   Blood Glucose Monitoring Suppl (ACCU-CHEK GUIDE) w/Device KIT, Use 2 times daily to check blood sugar., Disp: 1 kit, Rfl: 0   calcium carbonate (OS-CAL) 600 MG TABS tablet, Take 1,200 mg by mouth at bedtime., Disp: , Rfl:    Cholecalciferol (VITAMIN D) 50 MCG (2000 UT) CAPS, Take 2,000 Units by mouth at bedtime., Disp: , Rfl:    citalopram (CELEXA) 40 MG tablet, Take 1 tablet (40 mg total) by mouth at bedtime., Disp: 90 tablet, Rfl: 1   diphenhydrAMINE (SOMINEX) 25 MG tablet, Take 25 mg by mouth at bedtime as needed for allergies or sleep., Disp: , Rfl:    fluticasone (FLONASE) 50 MCG/ACT nasal spray, Place 1 spray into both nostrils daily., Disp: 16 g,  Rfl: 0   gabapentin (NEURONTIN) 300 MG capsule, Take 1 capsule (300 mg total) by mouth 3 (three) times daily., Disp: 270 capsule, Rfl: 1   Lancet Device MISC, 1 each by Does not apply route in the morning, at noon, and at bedtime. May substitute to any manufacturer covered by patient's insurance., Disp: 1 each, Rfl: 1   LORazepam (ATIVAN) 0.5 MG tablet, Take 1-2 pills 30 minutes prior to MRI, Disp: 2 tablet, Rfl: 0   metFORMIN (GLUCOPHAGE) 500 MG tablet, TAKE 1 TABLET BY MOUTH TWICE DAILY WITH A MEAL, Disp: 180 tablet, Rfl: 0   omeprazole (PRILOSEC) 20 MG capsule, Take 20 mg by mouth daily., Disp: , Rfl:     OZEMPIC, 1 MG/DOSE, 4 MG/3ML SOPN, INJECT 1 MG INTO THE SKIN ONCE A WEEK, Disp: 9 mL, Rfl: 0   rosuvastatin (CRESTOR) 5 MG tablet, TAKE 1 TABLET BY MOUTH ONCE DAILY AT BEDTIME, Disp: 90 tablet, Rfl: 1   telmisartan (MICARDIS) 40 MG tablet, Take 1 tablet (40 mg total) by mouth daily., Disp: 90 tablet, Rfl: 1  Allergies  Allergen Reactions   Lisinopril Cough         Objective:  Physical Exam  General: AAO x3, NAD  Dermatological: Thick hyperkeratotic total aspect of the right fourth toe missing distal lateral portion.  There is no underlying ulceration, drainage or signs of infection.  There is no toenail present today as it has been removed previously.  No open lesions.  Vascular: Dorsalis Pedis artery and Posterior Tibial artery pedal pulses are 2/4 bilateral with immedate capillary fill time.  There is no pain with calf compression, swelling, warmth, erythema.   Neruologic: Grossly intact via light touch bilateral.  Musculoskeletal: Adductovarus present of the fourth toe mostly.  Tenderness palpation of the skin lesion but no other areas of discomfort.      Assessment:   Adductovarus, hyperkeratotic lesion     Plan:  -Treatment options discussed including all alternatives, risks, and complications -Etiology of symptoms were discussed -X-rays obtained reviewed.  Multiple views were obtained and evidence of acute fracture.  Dr. There is noted. -Sharply debrided hyperkeratotic lesion with any complications or bleeding.  Unfortunate this is coming more from not having the nail as well as the rotation of the toe.  Dispensed offloading pads.  Discussed shoe modifications avoid excess pressure.     Return if symptoms worsen or fail to improve.  Vivi Barrack DPM

## 2023-08-01 NOTE — Patient Instructions (Signed)

## 2023-08-04 DIAGNOSIS — Z1231 Encounter for screening mammogram for malignant neoplasm of breast: Secondary | ICD-10-CM | POA: Diagnosis not present

## 2023-08-04 LAB — HM MAMMOGRAPHY

## 2023-08-06 ENCOUNTER — Encounter: Payer: Self-pay | Admitting: Nurse Practitioner

## 2023-08-13 ENCOUNTER — Other Ambulatory Visit: Payer: Self-pay

## 2023-08-13 MED ORDER — TELMISARTAN 40 MG PO TABS: 40.0000 mg | ORAL_TABLET | Freq: Every day | ORAL | 1 refills | Status: DC

## 2023-08-26 ENCOUNTER — Other Ambulatory Visit: Payer: Self-pay | Admitting: Nurse Practitioner

## 2023-08-26 DIAGNOSIS — G629 Polyneuropathy, unspecified: Secondary | ICD-10-CM

## 2023-08-26 MED ORDER — GABAPENTIN 300 MG PO CAPS: 300.0000 mg | ORAL_CAPSULE | Freq: Three times a day (TID) | ORAL | 1 refills | Status: DC

## 2023-08-26 NOTE — Telephone Encounter (Signed)
 Copied from CRM [id number]. Topic: Clinical - Medication Refill >> Aug 26, 2023  9:40 AM Geroge Baseman wrote: Most Recent Primary Care Visit:  Provider: [name]  Department: [facility]  Visit Type: OFFICE VISIT  Date: 07/14/2023  Medication: gabapentin (NEURONTIN) 300 MG capsule  Has the patient contacted their pharmacy? Yes Said to call office  Is this the correct pharmacy for this prescription? Yes If no, delete pharmacy and type the correct one.  This is the patient's preferred pharmacy:  [facility] [address] [place], [place] - [address] [place] [address] Phone: [phone] Fax: [phone]   Has the prescription been filled recently? No  Is the patient out of the medication? Yes, one left  Has the patient been seen for an appointment in the last year OR does the patient have an upcoming appointment? No  Can we respond through MyChart? No  Agent: Please be advised that Rx refills may take up to 3 business days. We ask that you follow-up with your pharmacy.

## 2023-08-26 NOTE — Telephone Encounter (Signed)
 Last Fill: 07/14/23 270 caps/1 refill  Last OV: 07/14/23 Next OV: 11/11/23  Routing to provider for review/authorization.

## 2023-09-04 ENCOUNTER — Other Ambulatory Visit: Payer: Self-pay | Admitting: Nurse Practitioner

## 2023-09-04 DIAGNOSIS — E785 Hyperlipidemia, unspecified: Secondary | ICD-10-CM

## 2023-11-10 NOTE — Progress Notes (Unsigned)
 Del Favia, CMA,acting as a Neurosurgeon for Danielle Epley, FNP.,have documented all relevant documentation on the behalf of Danielle Epley, FNP,as directed by  Danielle Epley, FNP while in the presence of Danielle Epley, FNP.  Subjective:  Patient ID: Danielle Calderon , female    DOB: Oct 11, 1960 , 63 y.o.   MRN: [id number]  No chief complaint on file.   HPI  HPI   Past Medical History:  Diagnosis Date   Anxiety    Depression    Diabetes mellitus without complication (HCC)    Type II   GERD (gastroesophageal reflux disease)    Headache    mild daily takes gabapentin    History of migraine headaches    Hyperlipidemia    Hypertension    Obesity (BMI 30-39.9) 03/21/2020     Family History  Problem Relation Age of Onset   Hypertension Mother    Hypertension Father    Cancer Father    Diabetes Father    Breast cancer Maternal Aunt    AAA (abdominal aortic aneurysm) Maternal Aunt    Cancer Maternal Great-grandmother        cervical vs. uterine (sounds like cervical, treated with cryotherapy)   Breast cancer Cousin        maternal     Current Outpatient Medications:    Accu-Chek Softclix Lancets lancets, Use as instructed, Disp: 100 each, Rfl: 2   albuterol  (VENTOLIN  HFA) 108 (90 Base) MCG/ACT inhaler, Inhale 1-2 puffs into the lungs every 6 (six) hours as needed for wheezing or shortness of breath., Disp: 8 g, Rfl: 0   alendronate  (FOSAMAX ) 70 MG tablet, Take with a full glass of water on an empty stomach ONCE A WEEK., Disp: 12 tablet, Rfl: 1   amoxicillin -clavulanate (AUGMENTIN ) 875-125 MG tablet, Take 1 tablet by mouth 2 (two) times daily., Disp: 14 tablet, Rfl: 0   benzonatate  (TESSALON ) 100 MG capsule, Take 1 capsule (100 mg total) by mouth every 8 (eight) hours., Disp: 21 capsule, Rfl: 0   Blood Glucose Monitoring Suppl (ACCU-CHEK GUIDE) w/Device KIT, Use 2 times daily to check blood sugar., Disp: 1 kit, Rfl: 0   calcium  carbonate (OS-CAL) 600 MG TABS tablet, Take 1,200 mg by mouth  at bedtime., Disp: , Rfl:    Cholecalciferol (VITAMIN D) 50 MCG (2000 UT) CAPS, Take 2,000 Units by mouth at bedtime., Disp: , Rfl:    citalopram  (CELEXA ) 40 MG tablet, Take 1 tablet (40 mg total) by mouth at bedtime., Disp: 90 tablet, Rfl: 1   diphenhydrAMINE (SOMINEX) 25 MG tablet, Take 25 mg by mouth at bedtime as needed for allergies or sleep., Disp: , Rfl:    fluticasone  (FLONASE ) 50 MCG/ACT nasal spray, Place 1 spray into both nostrils daily., Disp: 16 g, Rfl: 0   gabapentin  (NEURONTIN ) 300 MG capsule, Take 1 capsule (300 mg total) by mouth 3 (three) times daily., Disp: 270 capsule, Rfl: 1   LORazepam  (ATIVAN ) 0.5 MG tablet, Take 1-2 pills 30 minutes prior to MRI, Disp: 2 tablet, Rfl: 0   metFORMIN  (GLUCOPHAGE ) 500 MG tablet, TAKE 1 TABLET BY MOUTH TWICE DAILY WITH A MEAL, Disp: 180 tablet, Rfl: 0   omeprazole (PRILOSEC) 20 MG capsule, Take 20 mg by mouth daily., Disp: , Rfl:    OZEMPIC , 1 MG/DOSE, 4 MG/3ML SOPN, INJECT 1 MG INTO THE SKIN ONCE A WEEK, Disp: 9 mL, Rfl: 0   rosuvastatin  (CRESTOR ) 5 MG tablet, TAKE 1 TABLET BY MOUTH ONCE DAILY AT BEDTIME, Disp: 90 tablet, Rfl: 1  telmisartan  (MICARDIS ) 40 MG tablet, Take 1 tablet (40 mg total) by mouth daily., Disp: 90 tablet, Rfl: 1   Allergies  Allergen Reactions   Lisinopril Cough     Review of Systems   There were no vitals filed for this visit. There is no height or weight on file to calculate BMI.  Wt Readings from Last 3 Encounters:  07/14/23 171 lb 3.2 oz (77.7 kg)  03/11/23 168 lb 3.2 oz (76.3 kg)  11/12/22 167 lb 6.4 oz (75.9 kg)    The 10-year ASCVD risk score (Arnett DK, et al., 2019) is: 11%   Values used to calculate the score:     Age: 57 years     Sex: Female     Is Non-Hispanic African American: Yes     Diabetic: Yes     Tobacco smoker: No     Systolic Blood Pressure: 110 mmHg     Is BP treated: Yes     HDL Cholesterol: 69 mg/dL     Total Cholesterol: 167 mg/dL  Objective:  Physical Exam      Assessment  And Plan:  Essential hypertension  Type 2 diabetes mellitus with hyperlipidemia (HCC)    No follow-ups on file.  Patient was given opportunity to ask questions. Patient verbalized understanding of the plan and was able to repeat key elements of the plan. All questions were answered to their satisfaction.    Inge Mangle, FNP, have reviewed all documentation for this visit. The documentation on 11/10/23 for the exam, diagnosis, procedures, and orders are all accurate and complete.   IF YOU HAVE BEEN REFERRED TO A SPECIALIST, IT MAY TAKE 1-2 WEEKS TO SCHEDULE/PROCESS THE REFERRAL. IF YOU HAVE NOT HEARD FROM US /SPECIALIST IN TWO WEEKS, PLEASE GIVE US  A CALL AT [phone].

## 2023-11-11 ENCOUNTER — Ambulatory Visit: Payer: BC Managed Care – PPO | Admitting: Nurse Practitioner

## 2023-11-11 ENCOUNTER — Encounter: Payer: Self-pay | Admitting: Nurse Practitioner

## 2023-11-11 VITALS — BP 120/80 | HR 60 | Temp 98.1°F | Ht 65.0 in | Wt 173.6 lb

## 2023-11-11 DIAGNOSIS — E1169 Type 2 diabetes mellitus with other specified complication: Secondary | ICD-10-CM

## 2023-11-11 DIAGNOSIS — I10 Essential (primary) hypertension: Secondary | ICD-10-CM | POA: Diagnosis not present

## 2023-11-11 DIAGNOSIS — Z780 Asymptomatic menopausal state: Secondary | ICD-10-CM

## 2023-11-11 DIAGNOSIS — E785 Hyperlipidemia, unspecified: Secondary | ICD-10-CM | POA: Diagnosis not present

## 2023-11-11 DIAGNOSIS — M8580 Other specified disorders of bone density and structure, unspecified site: Secondary | ICD-10-CM | POA: Diagnosis not present

## 2023-11-11 DIAGNOSIS — Z2821 Immunization not carried out because of patient refusal: Secondary | ICD-10-CM

## 2023-11-11 MED ORDER — ALENDRONATE SODIUM 70 MG PO TABS: ORAL_TABLET | ORAL | 1 refills | Status: DC

## 2023-11-11 NOTE — Assessment & Plan Note (Signed)

## 2023-11-11 NOTE — Assessment & Plan Note (Signed)
Blood pressure is stable. Continue current medications. Will check eGFR

## 2023-11-11 NOTE — Assessment & Plan Note (Signed)
She is to continue Fosamax and encouraged to increase her low impact walking

## 2023-11-11 NOTE — Assessment & Plan Note (Addendum)
 HgbA1c is stable, continue current medications. She is tolerating well. Cholesterol levels are good but still not at goal of LDL less than 70. Encouraged to increase her physical activity.

## 2023-11-12 LAB — BMP8+EGFR
BUN/Creatinine Ratio: 14 (ref 12–28)
BUN: 11 mg/dL (ref 8–27)
CO2: 23 mmol/L (ref 20–29)
Calcium: 8.8 mg/dL (ref 8.7–10.3)
Chloride: 103 mmol/L (ref 96–106)
Creatinine, Ser: 0.76 mg/dL (ref 0.57–1.00)
Glucose: 70 mg/dL (ref 70–99)
Potassium: 4.2 mmol/L (ref 3.5–5.2)
Sodium: 140 mmol/L (ref 134–144)
eGFR: 87 mL/min/{1.73_m2} (ref >59)

## 2023-11-12 LAB — HEMOGLOBIN A1C
Est. average glucose Bld gHb Est-mCnc: 111 mg/dL
Hgb A1c MFr Bld: 5.5 % (ref 4.8–5.6)

## 2023-11-12 LAB — MICROALBUMIN / CREATININE URINE RATIO
Creatinine, Urine: 115.8 mg/dL
Microalb/Creat Ratio: 3 mg/g{creat} (ref 0–29)
Microalbumin, Urine: 3.4 ug/mL

## 2023-12-11 ENCOUNTER — Other Ambulatory Visit: Payer: Self-pay | Admitting: Nurse Practitioner

## 2023-12-11 DIAGNOSIS — E1169 Type 2 diabetes mellitus with other specified complication: Secondary | ICD-10-CM

## 2023-12-11 MED ORDER — METFORMIN HCL 500 MG PO TABS: 500.0000 mg | ORAL_TABLET | Freq: Two times a day (BID) | ORAL | 0 refills | Status: DC

## 2023-12-11 NOTE — Telephone Encounter (Signed)
 Copied from CRM [id number]. Topic: Clinical - Medication Refill >> Dec 11, 2023  9:44 AM Allyne Areola wrote: Medication: metFORMIN  (GLUCOPHAGE ) 500 MG tablet [[id number]]  Has the patient contacted their pharmacy? Yes, they asked patient to contact the doctor's office.  (Agent: If no, request that the patient contact the pharmacy for the refill. If patient does not wish to contact the pharmacy document the reason why and proceed with request.) (Agent: If yes, when and what did the pharmacy advise?)  This is the patient's preferred pharmacy:  [facility] [address], [place] - [address] [place] [address] Phone: [phone] Fax: [phone]  Is this the correct pharmacy for this prescription? Yes If no, delete pharmacy and type the correct one.   Has the prescription been filled recently? No  Is the patient out of the medication? Yes  Has the patient been seen for an appointment in the last year OR does the patient have an upcoming appointment? Yes  Can we respond through MyChart? Yes  Agent: Please be advised that Rx refills may take up to 3 business days. We ask that you follow-up with your pharmacy.

## 2023-12-11 NOTE — Telephone Encounter (Signed)
 Last Fill: 09/04/23  Last OV: 11/11/23 Next OV: 03/15/24  Routing to provider for review/authorization.

## 2023-12-16 ENCOUNTER — Other Ambulatory Visit: Payer: Self-pay

## 2023-12-16 DIAGNOSIS — E1169 Type 2 diabetes mellitus with other specified complication: Secondary | ICD-10-CM

## 2023-12-16 MED ORDER — METFORMIN HCL 500 MG PO TABS: 500.0000 mg | ORAL_TABLET | Freq: Two times a day (BID) | ORAL | 0 refills | Status: AC

## 2024-02-17 ENCOUNTER — Other Ambulatory Visit: Payer: Self-pay | Admitting: Nurse Practitioner

## 2024-02-17 DIAGNOSIS — E782 Mixed hyperlipidemia: Secondary | ICD-10-CM

## 2024-03-15 ENCOUNTER — Ambulatory Visit: Payer: Self-pay | Admitting: Nurse Practitioner

## 2024-03-15 ENCOUNTER — Encounter: Payer: Self-pay | Admitting: Nurse Practitioner

## 2024-03-15 VITALS — BP 120/74 | HR 61 | Temp 98.0°F | Ht 65.0 in | Wt 175.2 lb

## 2024-03-15 DIAGNOSIS — F3342 Major depressive disorder, recurrent, in full remission: Secondary | ICD-10-CM | POA: Diagnosis not present

## 2024-03-15 DIAGNOSIS — E1169 Type 2 diabetes mellitus with other specified complication: Secondary | ICD-10-CM | POA: Diagnosis not present

## 2024-03-15 DIAGNOSIS — Z23 Encounter for immunization: Secondary | ICD-10-CM

## 2024-03-15 DIAGNOSIS — I10 Essential (primary) hypertension: Secondary | ICD-10-CM

## 2024-03-15 DIAGNOSIS — Z79899 Other long term (current) drug therapy: Secondary | ICD-10-CM | POA: Diagnosis not present

## 2024-03-15 DIAGNOSIS — Z0000 Encounter for general adult medical examination without abnormal findings: Secondary | ICD-10-CM

## 2024-03-15 DIAGNOSIS — E785 Hyperlipidemia, unspecified: Secondary | ICD-10-CM

## 2024-03-15 LAB — POCT URINALYSIS DIP (CLINITEK)
Bilirubin, UA: NEGATIVE
Blood, UA: NEGATIVE
Glucose, UA: NEGATIVE mg/dL
Ketones, POC UA: NEGATIVE mg/dL
Leukocytes, UA: NEGATIVE
Nitrite, UA: NEGATIVE
POC PROTEIN,UA: NEGATIVE
Spec Grav, UA: 1.015 (ref 1.010–1.025)
Urobilinogen, UA: 1 U/dL
pH, UA: 7 (ref 5.0–8.0)

## 2024-03-15 MED ORDER — OZEMPIC (1 MG/DOSE) 4 MG/3ML ~~LOC~~ SOPN: 1.0000 mg | PEN_INJECTOR | SUBCUTANEOUS | 0 refills | Status: DC

## 2024-03-15 MED ORDER — CARIPRAZINE HCL 1.5 MG PO CAPS: 1.5000 mg | ORAL_CAPSULE | Freq: Every day | ORAL | 1 refills | Status: AC

## 2024-03-15 MED ORDER — OZEMPIC (1 MG/DOSE) 4 MG/3ML ~~LOC~~ SOPN: 1.0000 mg | PEN_INJECTOR | SUBCUTANEOUS | 1 refills | Status: DC

## 2024-03-15 NOTE — Patient Instructions (Signed)
 Health Maintenance  Topic Date Due   Complete foot exam   03/10/2024   COVID-19 Vaccine (4 - 2025-26 season) 03/31/2024*   Eye exam for diabetics  04/20/2024   Hemoglobin A1C  05/13/2024   Breast Cancer Screening  08/03/2024   DEXA scan (bone density measurement)  08/14/2024   Yearly kidney function blood test for diabetes  11/10/2024   Yearly kidney health urinalysis for diabetes  11/10/2024   DTaP/Tdap/Td vaccine (2 - Td or Tdap) 02/02/2030   Colon Cancer Screening  09/26/2030   Pneumococcal Vaccine for age over 3  Completed   Flu Shot  Completed   Hepatitis C Screening  Completed   HIV Screening  Completed   Zoster (Shingles) Vaccine  Completed   Hepatitis B Vaccine  Aged Out   HPV Vaccine  Aged Out   Meningitis B Vaccine  Aged Out  *Topic was postponed. The date shown is not the original due date.

## 2024-03-15 NOTE — Progress Notes (Signed)
 LILLETTE Kristeen JINNY Gladis, CMA,acting as a Neurosurgeon for Gaines Ada, FNP.,have documented all relevant documentation on the behalf of Gaines Ada, FNP,as directed by  Gaines Ada, FNP while in the presence of Gaines Ada, FNP.  Subjective:    Patient ID: Danielle Calderon , female    DOB: [birth year] , 63 y.o.   MRN: [id number]  Chief Complaint  Patient presents with   Annual Exam    Patient presents today for HM, Patient reports compliance with medication. Patient denies any chest pain, SOB, or headaches. Patient has no concerns today.      HPI  Discussed the use of AI scribe software for clinical note transcription with the patient, who gave verbal consent to proceed.  History of Present Illness Danielle Calderon is a 63 year old female who presents for an annual physical exam and follow-up for depression.  She has experienced increased depressive symptoms since May, including lack of motivation, increased desire to sleep, and decreased interest in activities, particularly during weekdays. She attributes these symptoms to job-related stress and changes. She continues to work full-time but is considering reducing her hours. She has been taking citalopram  40 mg daily for depression, but her symptoms have persisted.  She occasionally socializes with friends on weekends, which provides some relief from her symptoms. She has a history of counseling in the early 2000s following the death of her parents, which was beneficial at the time, but she has not engaged in counseling recently.  Her diet is unrestricted, and she acknowledges a slight weight gain from 171 lbs in January to 175 lbs currently. She does not engage in regular exercise, which she believes might help with her depression. She had a total hysterectomy in the past.  She works at [employer] and describes her job as demanding, often requiring her to work beyond her scheduled hours. 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  She takes metformin  twice daily for diabetes management. She performs self-breast exams regularly.   Past Medical History:  Diagnosis Date   Anxiety    Depression    Diabetes mellitus without complication (HCC)    Type II   GERD (gastroesophageal reflux disease)    Headache    mild daily takes gabapentin    History of COVID-19 03/11/2023   History of migraine headaches    Hyperlipidemia    Hypertension    Obesity (BMI 30-39.9) 03/21/2020     Family History  Problem Relation Age of Onset   Hypertension Mother    Hypertension Father    Cancer Father    Diabetes Father    Breast cancer Maternal Aunt    AAA (abdominal aortic aneurysm) Maternal Aunt    Cancer Maternal Great-grandmother        cervical vs. uterine (sounds like cervical, treated with cryotherapy)   Breast cancer Cousin        maternal     Current Outpatient Medications:    Accu-Chek Softclix Lancets lancets, Use as instructed, Disp: 100 each, Rfl: 2   alendronate  (FOSAMAX ) 70 MG tablet, Take with a full glass of water on an empty stomach ONCE A WEEK., Disp: 12 tablet, Rfl: 1   Blood Glucose Monitoring Suppl (ACCU-CHEK GUIDE) w/Device KIT, Use 2 times daily to check blood sugar., Disp: 1 kit, Rfl: 0   calcium  carbonate (OS-CAL) 600 MG TABS tablet, Take 1,200 mg by mouth at bedtime., Disp: , Rfl:    cariprazine  (VRAYLAR ) 1.5  MG capsule, Take 1 capsule (1.5 mg total) by mouth daily., Disp: 90 capsule, Rfl: 1   Cholecalciferol (VITAMIN D) 50 MCG (2000 UT) CAPS, Take 2,000 Units by mouth at bedtime., Disp: , Rfl:    citalopram  (CELEXA ) 40 MG tablet, Take 1 tablet (40 mg total) by mouth at bedtime., Disp: 90 tablet, Rfl: 1   diphenhydrAMINE (SOMINEX) 25 MG tablet, Take 25 mg by mouth at bedtime as needed for allergies or sleep., Disp: , Rfl:    fluticasone  (FLONASE ) 50 MCG/ACT nasal spray, Place 1 spray into both nostrils daily., Disp: 16 g, Rfl: 0   gabapentin  (NEURONTIN ) 300  MG capsule, Take 1 capsule (300 mg total) by mouth 3 (three) times daily., Disp: 270 capsule, Rfl: 1   LORazepam  (ATIVAN ) 0.5 MG tablet, Take 1-2 pills 30 minutes prior to MRI, Disp: 2 tablet, Rfl: 0   metFORMIN  (GLUCOPHAGE ) 500 MG tablet, Take 1 tablet (500 mg total) by mouth 2 (two) times daily with a meal., Disp: 180 tablet, Rfl: 0   omeprazole (PRILOSEC) 20 MG capsule, Take 20 mg by mouth daily., Disp: , Rfl:    rosuvastatin  (CRESTOR ) 5 MG tablet, TAKE 1 TABLET BY MOUTH ONCE DAILY AT BEDTIME, Disp: 90 tablet, Rfl: 0   telmisartan  (MICARDIS ) 40 MG tablet, Take 1 tablet (40 mg total) by mouth daily., Disp: 90 tablet, Rfl: 1   albuterol  (VENTOLIN  HFA) 108 (90 Base) MCG/ACT inhaler, Inhale 1-2 puffs into the lungs every 6 (six) hours as needed for wheezing or shortness of breath. (Patient not taking: Reported on 03/15/2024), Disp: 8 g, Rfl: 0   Semaglutide , 1 MG/DOSE, (OZEMPIC , 1 MG/DOSE,) 4 MG/3ML SOPN, Inject 1 mg into the skin once a week., Disp: 9 mL, Rfl: 1   Allergies  Allergen Reactions   Lisinopril Cough    The patient states she had a total hysterectomy.  Negative for: breast discharge, breast lump(s), breast pain and breast self exam. Associated symptoms include abnormal vaginal bleeding. Pertinent negatives include abnormal bleeding (hematology), anxiety, decreased libido, depression, difficulty falling sleep, dyspareunia, history of infertility, nocturia, sexual dysfunction, sleep disturbances, urinary incontinence, urinary urgency, vaginal discharge and vaginal itching. Diet regular.  The patient states her exercise level is none.   The patient's tobacco use is:  Social History   Tobacco Use  Smoking Status Former   Current packs/day: 0.00   Types: Cigarettes   Start date: 68   Quit date: 2004   Years since quitting: 21.7  Smokeless Tobacco Never  She has been exposed to passive smoke. The patient's alcohol use is:  Social History   Substance and Sexual Activity  Alcohol  Use Yes   Alcohol/week: 2.0 standard drinks of alcohol   Types: 2 Glasses of wine per week    Review of Systems  Constitutional: Negative.   HENT: Negative.    Eyes: Negative.   Respiratory: Negative.  Negative for shortness of breath.   Cardiovascular: Negative.   Gastrointestinal: Negative.   Endocrine: Negative.   Genitourinary: Negative.   Musculoskeletal: Negative.   Skin: Negative.   Allergic/Immunologic: Negative.   Neurological: Negative.  Negative for headaches.  Hematological: Negative.   Psychiatric/Behavioral: Negative.       Today's Vitals   03/15/24 1009  BP: 120/74  Pulse: 61  Temp: 98 F (36.7 C)  TempSrc: Oral  Weight: 175 lb 3.2 oz (79.5 kg)  Height: 5' 5 (1.651 m)  PainSc: 0-No pain   Body mass index is 29.15 kg/m.  Wt Readings from Last  3 Encounters:  03/15/24 175 lb 3.2 oz (79.5 kg)  11/11/23 173 lb 9.6 oz (78.7 kg)  07/14/23 171 lb 3.2 oz (77.7 kg)     Objective:  Physical Exam Vitals and nursing note reviewed.  Constitutional:      General: She is not in acute distress.    Appearance: Normal appearance. She is well-developed.  HENT:     Head: Normocephalic and atraumatic.     Right Ear: Hearing, tympanic membrane, ear canal and external ear normal. There is no impacted cerumen.     Left Ear: Hearing, tympanic membrane, ear canal and external ear normal. There is no impacted cerumen.     Nose: Nose normal.     Mouth/Throat:     Mouth: Mucous membranes are moist.  Eyes:     General: Lids are normal.     Extraocular Movements: Extraocular movements intact.     Conjunctiva/sclera: Conjunctivae normal.     Pupils: Pupils are equal, round, and reactive to light.     Funduscopic exam:    Right eye: No papilledema.        Left eye: No papilledema.  Neck:     Thyroid : No thyroid  mass.     Vascular: No carotid bruit.  Cardiovascular:     Rate and Rhythm: Normal rate and regular rhythm.     Pulses: Normal pulses.     Heart sounds:  Normal heart sounds. No murmur heard. Pulmonary:     Effort: Pulmonary effort is normal. No respiratory distress.     Breath sounds: Normal breath sounds. No wheezing.  Chest:     Chest wall: No mass.  Breasts:    Tanner Score is 5.     Right: Normal. No mass or tenderness.     Left: Normal. No mass or tenderness.  Abdominal:     General: Abdomen is flat. Bowel sounds are normal. There is no distension.     Palpations: Abdomen is soft.     Tenderness: There is no abdominal tenderness.  Genitourinary:    Comments: Seen by GYN this year Musculoskeletal:        General: No swelling. Normal range of motion.     Cervical back: Full passive range of motion without pain, normal range of motion and neck supple.     Right lower leg: No edema.     Left lower leg: No edema.  Lymphadenopathy:     Upper Body:     Right upper body: No supraclavicular, axillary or pectoral adenopathy.     Left upper body: No supraclavicular, axillary or pectoral adenopathy.  Skin:    General: Skin is warm and dry.     Capillary Refill: Capillary refill takes less than 2 seconds.  Neurological:     General: No focal deficit present.     Mental Status: She is alert and oriented to person, place, and time.     Cranial Nerves: No cranial nerve deficit.     Sensory: No sensory deficit.     Motor: No weakness.  Psychiatric:        Mood and Affect: Mood normal.        Behavior: Behavior normal.        Thought Content: Thought content normal.        Judgment: Judgment normal.         03/15/2024   10:57 AM 11/11/2023   12:17 PM 03/11/2023   10:20 AM 08/12/2022   12:26 PM 08/12/2022   11:35 AM  Depression screen PHQ 2/9  Decreased Interest 1 0 0 0 0  Down, Depressed, Hopeless 1 1 0 0 0  PHQ - 2 Score 2 1 0 0 0  Altered sleeping 1 2 0 0   Tired, decreased energy 1 2 0 1   Change in appetite 1 1 0 0   Feeling bad or failure about yourself  1 0 0 0   Trouble concentrating 1 1 0 0   Moving slowly or  fidgety/restless 1 0 0 0   Suicidal thoughts 0 0 0 0   PHQ-9 Score 8 7 0 1   Difficult doing work/chores Somewhat difficult Not difficult at all Not difficult at all Not difficult at all        03/15/2024   10:57 AM 11/11/2023   12:18 PM 03/11/2023   10:20 AM  GAD 7 : Generalized Anxiety Score  Nervous, Anxious, on Edge 0 1 0  Control/stop worrying 1 1 0  Worry too much - different things 1 1 0  Trouble relaxing 1 1 0  Restless 0 0 0  Easily annoyed or irritable 1 2 0  Afraid - awful might happen 1 1 0  Total GAD 7 Score 5 7 0  Anxiety Difficulty Somewhat difficult Somewhat difficult Not difficult at all     Assessment And Plan:     Encounter for annual health examination Assessment & Plan: Behavior modifications discussed and diet history reviewed.   Pt will continue to exercise regularly and modify diet with low GI, plant based foods and decrease intake of processed foods.  Recommend intake of daily multivitamin, Vitamin D, and calcium .  Recommend mammogram and colonoscopy for preventive screenings, as well as recommend immunizations that include influenza (declined), TDAP, and Shingles    Type 2 diabetes mellitus with hyperlipidemia (HCC) Assessment & Plan: Diabetes management ongoing with metformin  and Ozempic . - Continue metformin  twice daily and Ozempic  as prescribed. - Monitor A1c levels. - Confirm flu vaccine administration. - Encourage daily foot checks and moisturization.  Orders: -     EKG 12-Lead -     POCT URINALYSIS DIP (CLINITEK) -     Microalbumin / creatinine urine ratio -     CMP14+EGFR -     Hemoglobin A1c -     Lipid panel -     Ozempic  (1 MG/DOSE); Inject 1 mg into the skin once a week.  Dispense: 9 mL; Refill: 1  Essential hypertension -     EKG 12-Lead -     POCT URINALYSIS DIP (CLINITEK) -     Microalbumin / creatinine urine ratio -     CMP14+EGFR  Need for influenza vaccination -     Flu vaccine trivalent PF, 6mos and  older(Flulaval,Afluria,Fluarix,Fluzone)  Other long term (current) drug therapy -     CBC with Differential/Platelet  Recurrent major depressive disorder, in full remission (HCC) Assessment & Plan: Increased depressive symptoms potentially triggered by job-related stress. Currently on maximum dose of citalopram  40 mg. - Add Vraylar  as adjunct to citalopram . Provide month's supply of samples and discount card. - Instruct to take Vraylar  in morning. Monitor for side effects such as dry mouth or thought changes. - Refer to behavioral health specialist if no improvement with medication adjustment. - Schedule follow-up in four weeks to assess response. - Depression screen score 8   Other orders -     Cariprazine  HCl; Take 1 capsule (1.5 mg total) by mouth daily.  Dispense: 90 capsule; Refill: 1  Return for 1 year physical, controlled DM check 4 months. Patient was given opportunity to ask questions. Patient verbalized understanding of the plan and was able to repeat key elements of the plan. All questions were answered to their satisfaction.   Gaines Ada, FNP  I, Gaines Ada, FNP, have reviewed all documentation for this visit. The documentation on 03/15/24 for the exam, diagnosis, procedures, and orders are all accurate and complete.

## 2024-03-16 LAB — CMP14+EGFR
ALT: 26 IU/L (ref 0–32)
AST: 32 IU/L (ref 0–40)
Albumin: 3.8 g/dL — ABNORMAL LOW (ref 3.9–4.9)
Alkaline Phosphatase: 49 IU/L — ABNORMAL LOW (ref 51–125)
BUN/Creatinine Ratio: 11 — ABNORMAL LOW (ref 12–28)
BUN: 8 mg/dL (ref 8–27)
Bilirubin Total: 0.5 mg/dL (ref 0.0–1.2)
CO2: 25 mmol/L (ref 20–29)
Calcium: 9 mg/dL (ref 8.7–10.3)
Chloride: 102 mmol/L (ref 96–106)
Creatinine, Ser: 0.74 mg/dL (ref 0.57–1.00)
Globulin, Total: 3.2 g/dL (ref 1.5–4.5)
Glucose: 78 mg/dL (ref 70–99)
Potassium: 4.1 mmol/L (ref 3.5–5.2)
Sodium: 140 mmol/L (ref 134–144)
Total Protein: 7 g/dL (ref 6.0–8.5)
eGFR: 90 mL/min/1.73 (ref >59)

## 2024-03-16 LAB — CBC WITH DIFFERENTIAL/PLATELET
Basophils Absolute: 0 x10E3/uL (ref 0.0–0.2)
Basos: 1 %
EOS (ABSOLUTE): 0.1 x10E3/uL (ref 0.0–0.4)
Eos: 3 %
Hematocrit: 37.9 % (ref 34.0–46.6)
Hemoglobin: 11.5 g/dL (ref 11.1–15.9)
Immature Grans (Abs): 0 x10E3/uL (ref 0.0–0.1)
Immature Granulocytes: 0 %
Lymphocytes Absolute: 1.8 x10E3/uL (ref 0.7–3.1)
Lymphs: 48 %
MCH: 25.7 pg — ABNORMAL LOW (ref 26.6–33.0)
MCHC: 30.3 g/dL — ABNORMAL LOW (ref 31.5–35.7)
MCV: 85 fL (ref 79–97)
Monocytes Absolute: 0.3 x10E3/uL (ref 0.1–0.9)
Monocytes: 8 %
Neutrophils Absolute: 1.5 x10E3/uL (ref 1.4–7.0)
Neutrophils: 40 %
Platelets: 216 x10E3/uL (ref 150–450)
RBC: 4.47 x10E6/uL (ref 3.77–5.28)
RDW: 13.9 % (ref 11.7–15.4)
WBC: 3.7 x10E3/uL (ref 3.4–10.8)

## 2024-03-16 LAB — HEMOGLOBIN A1C
Est. average glucose Bld gHb Est-mCnc: 117 mg/dL
Hgb A1c MFr Bld: 5.7 % — ABNORMAL HIGH (ref 4.8–5.6)

## 2024-03-16 LAB — LIPID PANEL
Chol/HDL Ratio: 2.5 ratio (ref 0.0–4.4)
Cholesterol, Total: 178 mg/dL (ref 100–199)
HDL: 72 mg/dL (ref >39)
LDL Chol Calc (NIH): 93 mg/dL (ref 0–99)
Triglycerides: 66 mg/dL (ref 0–149)
VLDL Cholesterol Cal: 13 mg/dL (ref 5–40)

## 2024-03-16 LAB — MICROALBUMIN / CREATININE URINE RATIO
Creatinine, Urine: 156.8 mg/dL
Microalb/Creat Ratio: 3 mg/g{creat} (ref 0–29)
Microalbumin, Urine: 4.7 ug/mL

## 2024-03-21 ENCOUNTER — Ambulatory Visit: Payer: Self-pay | Admitting: Nurse Practitioner

## 2024-03-21 DIAGNOSIS — F3342 Major depressive disorder, recurrent, in full remission: Secondary | ICD-10-CM | POA: Insufficient documentation

## 2024-03-21 NOTE — Assessment & Plan Note (Signed)
Behavior modifications discussed and diet history reviewed.   Pt will continue to exercise regularly and modify diet with low GI, plant based foods and decrease intake of processed foods.  Recommend intake of daily multivitamin, Vitamin D, and calcium.  Recommend mammogram and colonoscopy for preventive screenings, as well as recommend immunizations that include influenza (declined), TDAP, and Shingles

## 2024-03-21 NOTE — Assessment & Plan Note (Addendum)
 Increased depressive symptoms potentially triggered by job-related stress. Currently on maximum dose of citalopram  40 mg. - Add Vraylar  as adjunct to citalopram . Provide month's supply of samples and discount card. - Instruct to take Vraylar  in morning. Monitor for side effects such as dry mouth or thought changes. - Refer to behavioral health specialist if no improvement with medication adjustment. - Schedule follow-up in four weeks to assess response. - Depression screen score 8

## 2024-03-21 NOTE — Assessment & Plan Note (Signed)
 Diabetes management ongoing with metformin  and Ozempic . - Continue metformin  twice daily and Ozempic  as prescribed. - Monitor A1c levels. - Confirm flu vaccine administration. - Encourage daily foot checks and moisturization.

## 2024-04-01 ENCOUNTER — Other Ambulatory Visit: Payer: Self-pay | Admitting: Nurse Practitioner

## 2024-04-01 DIAGNOSIS — F3342 Major depressive disorder, recurrent, in full remission: Secondary | ICD-10-CM

## 2024-04-06 ENCOUNTER — Other Ambulatory Visit: Payer: Self-pay | Admitting: Nurse Practitioner

## 2024-04-06 DIAGNOSIS — F3342 Major depressive disorder, recurrent, in full remission: Secondary | ICD-10-CM

## 2024-04-28 DIAGNOSIS — E119 Type 2 diabetes mellitus without complications: Secondary | ICD-10-CM | POA: Diagnosis not present

## 2024-04-28 DIAGNOSIS — H2513 Age-related nuclear cataract, bilateral: Secondary | ICD-10-CM | POA: Diagnosis not present

## 2024-04-28 LAB — OPHTHALMOLOGY REPORT-SCANNED

## 2024-04-29 ENCOUNTER — Encounter: Payer: Self-pay | Admitting: Nurse Practitioner

## 2024-05-28 ENCOUNTER — Other Ambulatory Visit: Payer: Self-pay | Admitting: Nurse Practitioner

## 2024-05-28 DIAGNOSIS — E782 Mixed hyperlipidemia: Secondary | ICD-10-CM

## 2024-06-01 ENCOUNTER — Encounter: Payer: Self-pay | Admitting: Nurse Practitioner

## 2024-06-01 ENCOUNTER — Ambulatory Visit: Admitting: Nurse Practitioner

## 2024-06-01 VITALS — BP 120/82 | HR 68 | Temp 97.7°F | Ht 65.0 in | Wt 174.0 lb

## 2024-06-01 DIAGNOSIS — G629 Polyneuropathy, unspecified: Secondary | ICD-10-CM

## 2024-06-01 DIAGNOSIS — R319 Hematuria, unspecified: Secondary | ICD-10-CM | POA: Diagnosis not present

## 2024-06-01 DIAGNOSIS — I10 Essential (primary) hypertension: Secondary | ICD-10-CM

## 2024-06-01 DIAGNOSIS — Z780 Asymptomatic menopausal state: Secondary | ICD-10-CM

## 2024-06-01 DIAGNOSIS — N939 Abnormal uterine and vaginal bleeding, unspecified: Secondary | ICD-10-CM | POA: Insufficient documentation

## 2024-06-01 LAB — POCT URINALYSIS DIPSTICK
Bilirubin, UA: NEGATIVE
Glucose, UA: NEGATIVE
Ketones, UA: NEGATIVE
Leukocytes, UA: NEGATIVE
Nitrite, UA: NEGATIVE
Protein, UA: NEGATIVE
Spec Grav, UA: 1.02 (ref 1.010–1.025)
Urobilinogen, UA: 0.2 U/dL
pH, UA: 6 (ref 5.0–8.0)

## 2024-06-01 MED ORDER — ALENDRONATE SODIUM 70 MG PO TABS: ORAL_TABLET | ORAL | 1 refills | Status: AC

## 2024-06-01 MED ORDER — GABAPENTIN 300 MG PO CAPS: 300.0000 mg | ORAL_CAPSULE | Freq: Three times a day (TID) | ORAL | 1 refills | Status: AC

## 2024-06-01 NOTE — Progress Notes (Signed)
 LILLETTE Kristeen JINNY Gladis, CMA,acting as a neurosurgeon for Gaines Ada, FNP.,have documented all relevant documentation on the behalf of Gaines Ada, FNP,as directed by  Gaines Ada, FNP while in the presence of Gaines Ada, FNP.  Subjective:  Patient ID: Danielle Calderon , female    DOB: 1961-06-29 , 63 y.o.   MRN: [id number]  Chief Complaint  Patient presents with   Vaginal Bleeding    Patient presents today for vaginal bleeding that started Sunday she reports she saw blood in her underwear she reports the next morning it was a little. She reports today it was none. She reports she does feel like she has cramps in her stomach. She reports having a total hysterectomy     HPI  Discussed the use of AI scribe software for clinical note transcription with the patient, who gave verbal consent to proceed.  History of Present Illness Danielle Calderon is a 63 year old female who presents with vaginal bleeding.  She experienced vaginal bleeding that began on Sunday around 8 PM, initially noticing it in her underwear. By the following morning, the bleeding was a small amount, and by the morning of the visit, it had reduced to a scant amount and then stopped completely. She experienced cramping similar to menstrual cramps during the bleeding episodes.  She has a history of hysterectomy with removal of her ovaries performed several years ago.  Regarding urinary symptoms, she reports occasional urgency without frequency or burning. She describes a sensation of needing to urinate but having to wait for it to start, and sometimes feeling the need to urinate again shortly after. She recalls a past urinary tract infection that was asymptomatic.  Her current medications include Fosamax , which she indicates needs refilling.  Past Medical History:  Diagnosis Date   Anxiety    Depression    Diabetes mellitus without complication (HCC)    Type II   GERD (gastroesophageal reflux disease)    Headache    mild daily takes  gabapentin    History of COVID-19 03/11/2023   History of migraine headaches    Hyperlipidemia    Hypertension    Obesity (BMI 30-39.9) 03/21/2020     Family History  Problem Relation Age of Onset   Hypertension Mother    Hypertension Father    Cancer Father    Diabetes Father    Breast cancer Maternal Aunt    AAA (abdominal aortic aneurysm) Maternal Aunt    Cancer Maternal Great-grandmother        cervical vs. uterine (sounds like cervical, treated with cryotherapy)   Breast cancer Cousin        maternal     Current Outpatient Medications:    Accu-Chek Softclix Lancets lancets, Use as instructed, Disp: 100 each, Rfl: 2   Blood Glucose Monitoring Suppl (ACCU-CHEK GUIDE) w/Device KIT, Use 2 times daily to check blood sugar., Disp: 1 kit, Rfl: 0   calcium  carbonate (OS-CAL) 600 MG TABS tablet, Take 1,200 mg by mouth at bedtime., Disp: , Rfl:    cariprazine  (VRAYLAR ) 1.5 MG capsule, Take 1 capsule (1.5 mg total) by mouth daily., Disp: 90 capsule, Rfl: 1   Cholecalciferol (VITAMIN D) 50 MCG (2000 UT) CAPS, Take 2,000 Units by mouth at bedtime., Disp: , Rfl:    citalopram  (CELEXA ) 40 MG tablet, TAKE 1 TABLET BY MOUTH AT BEDTIME, Disp: 90 tablet, Rfl: 0   diphenhydrAMINE (SOMINEX) 25 MG tablet, Take 25 mg by mouth at bedtime as needed for allergies or sleep., Disp: , Rfl:  fluticasone  (FLONASE ) 50 MCG/ACT nasal spray, Place 1 spray into both nostrils daily., Disp: 16 g, Rfl: 0   LORazepam  (ATIVAN ) 0.5 MG tablet, Take 1-2 pills 30 minutes prior to MRI, Disp: 2 tablet, Rfl: 0   metFORMIN  (GLUCOPHAGE ) 500 MG tablet, Take 1 tablet (500 mg total) by mouth 2 (two) times daily with a meal., Disp: 180 tablet, Rfl: 0   omeprazole (PRILOSEC) 20 MG capsule, Take 20 mg by mouth daily., Disp: , Rfl:    rosuvastatin  (CRESTOR ) 5 MG tablet, TAKE 1 TABLET BY MOUTH ONCE DAILY AT BEDTIME, Disp: 90 tablet, Rfl: 0   Semaglutide , 1 MG/DOSE, (OZEMPIC , 1 MG/DOSE,) 4 MG/3ML SOPN, Inject 1 mg into the skin once  a week., Disp: 9 mL, Rfl: 1   albuterol  (VENTOLIN  HFA) 108 (90 Base) MCG/ACT inhaler, Inhale 1-2 puffs into the lungs every 6 (six) hours as needed for wheezing or shortness of breath. (Patient not taking: Reported on 06/01/2024), Disp: 8 g, Rfl: 0   alendronate  (FOSAMAX ) 70 MG tablet, Take with a full glass of water on an empty stomach ONCE A WEEK., Disp: 12 tablet, Rfl: 1   gabapentin  (NEURONTIN ) 300 MG capsule, Take 1 capsule (300 mg total) by mouth 3 (three) times daily., Disp: 270 capsule, Rfl: 1   telmisartan  (MICARDIS ) 40 MG tablet, Take 1 tablet by mouth once daily, Disp: 90 tablet, Rfl: 0   Allergies  Allergen Reactions   Lisinopril Cough     Review of Systems  Constitutional: Negative.   Respiratory: Negative.    Cardiovascular: Negative.   Neurological: Negative.   Psychiatric/Behavioral: Negative.       Today's Vitals   06/01/24 1625  BP: 120/82  Pulse: 68  Temp: 97.7 F (36.5 C)  TempSrc: Oral  Weight: 174 lb (78.9 kg)  Height: 5' 5 (1.651 m)  PainSc: 0-No pain   Body mass index is 28.96 kg/m.  Wt Readings from Last 3 Encounters:  06/01/24 174 lb (78.9 kg)  03/15/24 175 lb 3.2 oz (79.5 kg)  11/11/23 173 lb 9.6 oz (78.7 kg)      Objective:  Physical Exam Vitals and nursing note reviewed.  Constitutional:      General: She is not in acute distress.    Appearance: Normal appearance.  Eyes:     Pupils: Pupils are equal, round, and reactive to light.  Cardiovascular:     Rate and Rhythm: Normal rate and regular rhythm.     Pulses: Normal pulses.     Heart sounds: Normal heart sounds. No murmur heard. Pulmonary:     Effort: Pulmonary effort is normal. No respiratory distress.     Breath sounds: Normal breath sounds. No wheezing.  Skin:    General: Skin is warm and dry.     Capillary Refill: Capillary refill takes less than 2 seconds.  Neurological:     General: No focal deficit present.     Mental Status: She is alert and oriented to person, place,  and time.     Cranial Nerves: No cranial nerve deficit.     Motor: No weakness.  Psychiatric:        Mood and Affect: Mood normal.        Behavior: Behavior normal.        Thought Content: Thought content normal.        Judgment: Judgment normal.         Assessment And Plan:   Assessment & Plan Osteopenia after menopause She is to continue Fosamax  and  encouraged to increase her low impact walking Neuropathy Stable Vaginal bleeding Intermittent bleeding resolved. Considered vaginal atrophy, irritation, UTI, yeast, or bacterial vaginosis. - Performed speculum exam. - Obtained urine sample. - Evaluated for yeast or bacterial vaginosis. Essential hypertension Blood pressure is stable. Continue current medications. Will check eGFR Hematuria, unspecified type Small blood will send for urine culture.   Orders Placed This Encounter  Procedures   Culture, Urine   NuSwab Vaginitis Plus (VG+)   POCT urinalysis dipstick     Return for keep same next.  Patient was given opportunity to ask questions. Patient verbalized understanding of the plan and was able to repeat key elements of the plan. All questions were answered to their satisfaction.    LILLETTE Gaines Ada, FNP, have reviewed all documentation for this visit. The documentation on 06/01/2024 for the exam, diagnosis, procedures, and orders are all accurate and complete.   IF YOU HAVE BEEN REFERRED TO A SPECIALIST, IT MAY TAKE 1-2 WEEKS TO SCHEDULE/PROCESS THE REFERRAL. IF YOU HAVE NOT HEARD FROM US /SPECIALIST IN TWO WEEKS, PLEASE GIVE US  A CALL AT [phone].

## 2024-06-03 LAB — NUSWAB VAGINITIS PLUS (VG+)
Candida albicans, NAA: NEGATIVE
Candida glabrata, NAA: NEGATIVE
Chlamydia trachomatis, NAA: NEGATIVE
Neisseria gonorrhoeae, NAA: NEGATIVE
Trich vag by NAA: NEGATIVE

## 2024-06-04 LAB — URINE CULTURE: Organism ID, Bacteria: NO GROWTH

## 2024-06-06 ENCOUNTER — Other Ambulatory Visit: Payer: Self-pay | Admitting: Nurse Practitioner

## 2024-06-13 ENCOUNTER — Ambulatory Visit: Payer: Self-pay | Admitting: Nurse Practitioner

## 2024-06-13 NOTE — Assessment & Plan Note (Addendum)
She is to continue Fosamax and encouraged to increase her low impact walking

## 2024-06-13 NOTE — Assessment & Plan Note (Addendum)
 Stable

## 2024-06-13 NOTE — Assessment & Plan Note (Signed)
 Intermittent bleeding resolved. Considered vaginal atrophy, irritation, UTI, yeast, or bacterial vaginosis. - Performed speculum exam. - Obtained urine sample. - Evaluated for yeast or bacterial vaginosis.

## 2024-06-13 NOTE — Assessment & Plan Note (Addendum)
Blood pressure is stable. Continue current medications. Will check eGFR

## 2024-07-19 ENCOUNTER — Other Ambulatory Visit: Payer: Self-pay

## 2024-07-19 DIAGNOSIS — E1169 Type 2 diabetes mellitus with other specified complication: Secondary | ICD-10-CM

## 2024-07-19 MED ORDER — OZEMPIC (1 MG/DOSE) 4 MG/3ML ~~LOC~~ SOPN: 1.0000 mg | PEN_INJECTOR | SUBCUTANEOUS | 1 refills | Status: AC

## 2024-08-04 LAB — HM MAMMOGRAPHY

## 2024-08-06 ENCOUNTER — Encounter: Payer: Self-pay | Admitting: Nurse Practitioner

## 2024-08-17 ENCOUNTER — Ambulatory Visit: Payer: Self-pay | Admitting: Nurse Practitioner

## 2025-03-21 ENCOUNTER — Encounter: Payer: Self-pay | Admitting: Nurse Practitioner
